# Patient Record
Sex: Female | Born: 1940
Health system: Southern US, Community
[De-identification: ages and names within clinical notes are randomized; demographics above are authoritative.]

## PROBLEM LIST (undated history)

## (undated) DIAGNOSIS — M545 Low back pain, unspecified: Secondary | ICD-10-CM

## (undated) DIAGNOSIS — I10 Essential (primary) hypertension: Secondary | ICD-10-CM

## (undated) DIAGNOSIS — G8929 Other chronic pain: Secondary | ICD-10-CM

## (undated) DIAGNOSIS — Z972 Presence of dental prosthetic device (complete) (partial): Secondary | ICD-10-CM

## (undated) DIAGNOSIS — M549 Dorsalgia, unspecified: Secondary | ICD-10-CM

## (undated) DIAGNOSIS — I340 Nonrheumatic mitral (valve) insufficiency: Secondary | ICD-10-CM

## (undated) DIAGNOSIS — I330 Acute and subacute infective endocarditis: Secondary | ICD-10-CM

## (undated) DIAGNOSIS — R748 Abnormal levels of other serum enzymes: Secondary | ICD-10-CM

## (undated) DIAGNOSIS — R9389 Abnormal findings on diagnostic imaging of other specified body structures: Secondary | ICD-10-CM

## (undated) DIAGNOSIS — E785 Hyperlipidemia, unspecified: Secondary | ICD-10-CM

## (undated) DIAGNOSIS — M1990 Unspecified osteoarthritis, unspecified site: Secondary | ICD-10-CM

## (undated) DIAGNOSIS — Z973 Presence of spectacles and contact lenses: Secondary | ICD-10-CM

## (undated) DIAGNOSIS — I5189 Other ill-defined heart diseases: Secondary | ICD-10-CM

## (undated) HISTORY — DX: Other ill-defined heart diseases: I51.89

## (undated) HISTORY — DX: Acute and subacute infective endocarditis: I33.0

## (undated) HISTORY — DX: Hyperlipidemia, unspecified: E78.5

## (undated) HISTORY — PX: KNEE SURGERY: SHX244

## (undated) HISTORY — DX: Abnormal levels of other serum enzymes: R74.8

## (undated) HISTORY — DX: Essential (primary) hypertension: I10

## (undated) HISTORY — DX: Low back pain, unspecified: M54.50

## (undated) HISTORY — PX: US ECHOCARDIOGRAPHY: HXRAD669

## (undated) HISTORY — PX: COLONOSCOPY: SHX174

## (undated) HISTORY — PX: CARDIAC CATHETERIZATION: SHX172

## (undated) HISTORY — DX: Low back pain: M54.5

## (undated) HISTORY — PX: TONSILLECTOMY: SUR1361

## (undated) HISTORY — DX: Dorsalgia, unspecified: M54.9

---

## 2003-06-19 ENCOUNTER — Encounter
Admission: RE | Admit: 2003-06-19 | Discharge: 2003-09-17 | Payer: Self-pay | Admitting: Physical Medicine & Rehabilitation

## 2003-09-09 ENCOUNTER — Ambulatory Visit (HOSPITAL_COMMUNITY): Admission: RE | Admit: 2003-09-09 | Discharge: 2003-09-09 | Payer: Self-pay | Admitting: Orthopaedic Surgery

## 2003-09-25 ENCOUNTER — Encounter: Admission: RE | Admit: 2003-09-25 | Discharge: 2003-09-25 | Payer: Self-pay | Admitting: Orthopaedic Surgery

## 2003-10-07 ENCOUNTER — Ambulatory Visit (HOSPITAL_COMMUNITY): Admission: RE | Admit: 2003-10-07 | Discharge: 2003-10-07 | Payer: Self-pay | Admitting: Orthopaedic Surgery

## 2003-10-21 ENCOUNTER — Ambulatory Visit (HOSPITAL_COMMUNITY): Admission: RE | Admit: 2003-10-21 | Discharge: 2003-10-21 | Payer: Self-pay | Admitting: Cardiology

## 2003-10-21 HISTORY — PX: CARDIAC CATHETERIZATION: SHX172

## 2003-11-12 ENCOUNTER — Ambulatory Visit (HOSPITAL_COMMUNITY): Admission: RE | Admit: 2003-11-12 | Discharge: 2003-11-13 | Payer: Self-pay | Admitting: Orthopaedic Surgery

## 2003-11-12 HISTORY — PX: LUMBAR DISC SURGERY: SHX700

## 2003-11-25 ENCOUNTER — Ambulatory Visit: Admission: RE | Admit: 2003-11-25 | Discharge: 2003-11-25 | Payer: Self-pay | Admitting: Orthopaedic Surgery

## 2003-11-26 ENCOUNTER — Encounter: Payer: Self-pay | Admitting: Internal Medicine

## 2003-12-17 ENCOUNTER — Encounter: Payer: Self-pay | Admitting: Internal Medicine

## 2004-02-16 ENCOUNTER — Ambulatory Visit (HOSPITAL_COMMUNITY): Admission: RE | Admit: 2004-02-16 | Discharge: 2004-02-16 | Payer: Self-pay | Admitting: Orthopaedic Surgery

## 2004-02-16 HISTORY — PX: KNEE ARTHROSCOPY W/ MENISCAL REPAIR: SHX1877

## 2004-03-02 ENCOUNTER — Ambulatory Visit: Payer: Self-pay | Admitting: Internal Medicine

## 2004-04-11 DIAGNOSIS — I330 Acute and subacute infective endocarditis: Secondary | ICD-10-CM

## 2004-04-11 HISTORY — PX: SPINE SURGERY: SHX786

## 2004-04-11 HISTORY — DX: Acute and subacute infective endocarditis: I33.0

## 2004-04-21 ENCOUNTER — Ambulatory Visit: Payer: Self-pay | Admitting: Cardiology

## 2004-05-03 ENCOUNTER — Ambulatory Visit: Payer: Self-pay

## 2004-05-26 ENCOUNTER — Ambulatory Visit: Payer: Self-pay | Admitting: Cardiovascular Disease

## 2004-06-24 ENCOUNTER — Ambulatory Visit (HOSPITAL_COMMUNITY): Admission: RE | Admit: 2004-06-24 | Discharge: 2004-06-24 | Payer: Self-pay | Admitting: Cardiovascular Disease

## 2004-07-07 ENCOUNTER — Ambulatory Visit: Payer: Self-pay | Admitting: *Deleted

## 2004-10-04 ENCOUNTER — Ambulatory Visit: Payer: Self-pay | Admitting: Internal Medicine

## 2004-11-23 ENCOUNTER — Ambulatory Visit: Payer: Self-pay | Admitting: Internal Medicine

## 2005-01-25 ENCOUNTER — Ambulatory Visit: Payer: Self-pay | Admitting: Internal Medicine

## 2005-02-14 ENCOUNTER — Ambulatory Visit: Payer: Self-pay | Admitting: Gastroenterology

## 2005-02-14 ENCOUNTER — Ambulatory Visit: Payer: Self-pay | Admitting: Internal Medicine

## 2005-02-28 ENCOUNTER — Ambulatory Visit: Payer: Self-pay | Admitting: Gastroenterology

## 2006-09-22 ENCOUNTER — Ambulatory Visit: Payer: Self-pay | Admitting: Internal Medicine

## 2006-09-22 LAB — CONVERTED CEMR LAB
Calcium: 9.7 mg/dL (ref 8.4–10.5)
Chloride: 107 meq/L (ref 96–112)
GFR calc non Af Amer: 90 mL/min

## 2006-11-16 DIAGNOSIS — I10 Essential (primary) hypertension: Secondary | ICD-10-CM | POA: Insufficient documentation

## 2006-12-06 ENCOUNTER — Encounter: Payer: Self-pay | Admitting: Internal Medicine

## 2008-02-28 ENCOUNTER — Ambulatory Visit: Payer: Self-pay | Admitting: Internal Medicine

## 2008-02-28 DIAGNOSIS — I080 Rheumatic disorders of both mitral and aortic valves: Secondary | ICD-10-CM | POA: Insufficient documentation

## 2008-02-28 DIAGNOSIS — E669 Obesity, unspecified: Secondary | ICD-10-CM | POA: Insufficient documentation

## 2008-02-28 DIAGNOSIS — E785 Hyperlipidemia, unspecified: Secondary | ICD-10-CM | POA: Insufficient documentation

## 2008-02-28 DIAGNOSIS — M549 Dorsalgia, unspecified: Secondary | ICD-10-CM | POA: Insufficient documentation

## 2008-03-03 LAB — CONVERTED CEMR LAB
ALT: 16 units/L (ref 0–35)
AST: 21 units/L (ref 0–37)
CO2: 30 meq/L (ref 19–32)
Chloride: 105 meq/L (ref 96–112)
Cholesterol: 190 mg/dL (ref 0–200)
Creatinine, Ser: 0.7 mg/dL (ref 0.4–1.2)
GFR calc Af Amer: 108 mL/min
GFR calc non Af Amer: 89 mL/min
Glucose, Bld: 105 mg/dL — ABNORMAL HIGH (ref 70–99)
HCT: 39.2 % (ref 36.0–46.0)
HDL: 48.4 mg/dL (ref >39.0)
Hemoglobin: 13.3 g/dL (ref 12.0–15.0)
LDL Cholesterol: 128 mg/dL — ABNORMAL HIGH (ref 0–99)
Lymphocytes Relative: 28.8 % (ref 12.0–46.0)
MCHC: 33.8 g/dL (ref 30.0–36.0)
MCV: 85.8 fL (ref 78.0–100.0)
Potassium: 4.1 meq/L (ref 3.5–5.1)
RDW: 13.2 % (ref 11.5–14.6)
Sodium: 143 meq/L (ref 135–145)
Triglycerides: 69 mg/dL (ref 0–149)
VLDL: 14 mg/dL (ref 0–40)
WBC: 6.2 10*3/uL (ref 4.5–10.5)

## 2008-11-04 ENCOUNTER — Ambulatory Visit: Payer: Self-pay | Admitting: Internal Medicine

## 2008-11-04 DIAGNOSIS — R609 Edema, unspecified: Secondary | ICD-10-CM | POA: Insufficient documentation

## 2009-01-02 ENCOUNTER — Ambulatory Visit: Payer: Self-pay | Admitting: Internal Medicine

## 2009-01-06 ENCOUNTER — Encounter: Payer: Self-pay | Admitting: *Deleted

## 2009-01-06 LAB — CONVERTED CEMR LAB
CO2: 29 meq/L (ref 19–32)
Chloride: 108 meq/L (ref 96–112)
GFR calc non Af Amer: 107.54 mL/min (ref >60)
Glucose, Bld: 95 mg/dL (ref 70–99)
Potassium: 5 meq/L (ref 3.5–5.1)

## 2009-06-19 ENCOUNTER — Ambulatory Visit: Payer: Self-pay | Admitting: Internal Medicine

## 2009-12-25 ENCOUNTER — Telehealth: Payer: Self-pay | Admitting: Internal Medicine

## 2009-12-25 ENCOUNTER — Ambulatory Visit: Payer: Self-pay | Admitting: Internal Medicine

## 2009-12-25 LAB — CONVERTED CEMR LAB
AST: 24 units/L (ref 0–37)
BUN: 13 mg/dL (ref 6–23)
Basophils Absolute: 0 10*3/uL (ref 0.0–0.1)
Basophils Relative: 0.2 % (ref 0.0–3.0)
Calcium: 9.7 mg/dL (ref 8.4–10.5)
Chloride: 104 meq/L (ref 96–112)
Cholesterol: 198 mg/dL (ref 0–200)
Hemoglobin: 13.5 g/dL (ref 12.0–15.0)
LDL Cholesterol: 146 mg/dL — ABNORMAL HIGH (ref 0–99)
Lymphs Abs: 1.8 10*3/uL (ref 0.7–4.0)
MCHC: 32.8 g/dL (ref 30.0–36.0)
Monocytes Absolute: 0.6 10*3/uL (ref 0.1–1.0)
Monocytes Relative: 6.6 % (ref 3.0–12.0)
Neutro Abs: 6.2 10*3/uL (ref 1.4–7.7)
Potassium: 5.1 meq/L (ref 3.5–5.1)
RDW: 14.2 % (ref 11.5–14.6)
Total Bilirubin: 0.5 mg/dL (ref 0.3–1.2)
Total Protein: 7.1 g/dL (ref 6.0–8.3)
WBC: 8.7 10*3/uL (ref 4.5–10.5)

## 2010-01-04 ENCOUNTER — Ambulatory Visit: Payer: Self-pay | Admitting: Internal Medicine

## 2010-01-26 ENCOUNTER — Telehealth: Payer: Self-pay | Admitting: *Deleted

## 2010-02-10 ENCOUNTER — Telehealth: Payer: Self-pay | Admitting: Internal Medicine

## 2010-02-10 ENCOUNTER — Ambulatory Visit: Payer: Self-pay | Admitting: Family Medicine

## 2010-02-10 DIAGNOSIS — J209 Acute bronchitis, unspecified: Secondary | ICD-10-CM | POA: Insufficient documentation

## 2010-02-10 DIAGNOSIS — R042 Hemoptysis: Secondary | ICD-10-CM | POA: Insufficient documentation

## 2010-02-23 ENCOUNTER — Encounter: Admission: RE | Admit: 2010-02-23 | Discharge: 2010-02-23 | Payer: Self-pay | Admitting: Internal Medicine

## 2010-02-23 ENCOUNTER — Ambulatory Visit: Payer: Self-pay | Admitting: Internal Medicine

## 2010-02-23 DIAGNOSIS — R059 Cough, unspecified: Secondary | ICD-10-CM | POA: Insufficient documentation

## 2010-02-23 DIAGNOSIS — R05 Cough: Secondary | ICD-10-CM

## 2010-02-23 IMAGING — MG MM DIGITAL SCREENING
6 series · 6 of 6 positions shown · non-contrast
Comparison: none

DG SCREEN MAMMOGRAM BILATERAL
Bilateral CC and MLO view(s) were taken.

DIGITAL SCREENING MAMMOGRAM WITH CAD:
The breast tissue is almost entirely fatty.  A possible mass is noted in the right breast.  Spot 
compression views and possibly sonography are recommended for further evaluation.  The left breast 
is unremarkable.
Images were processed with CAD.

[R CC (1 of 2)]
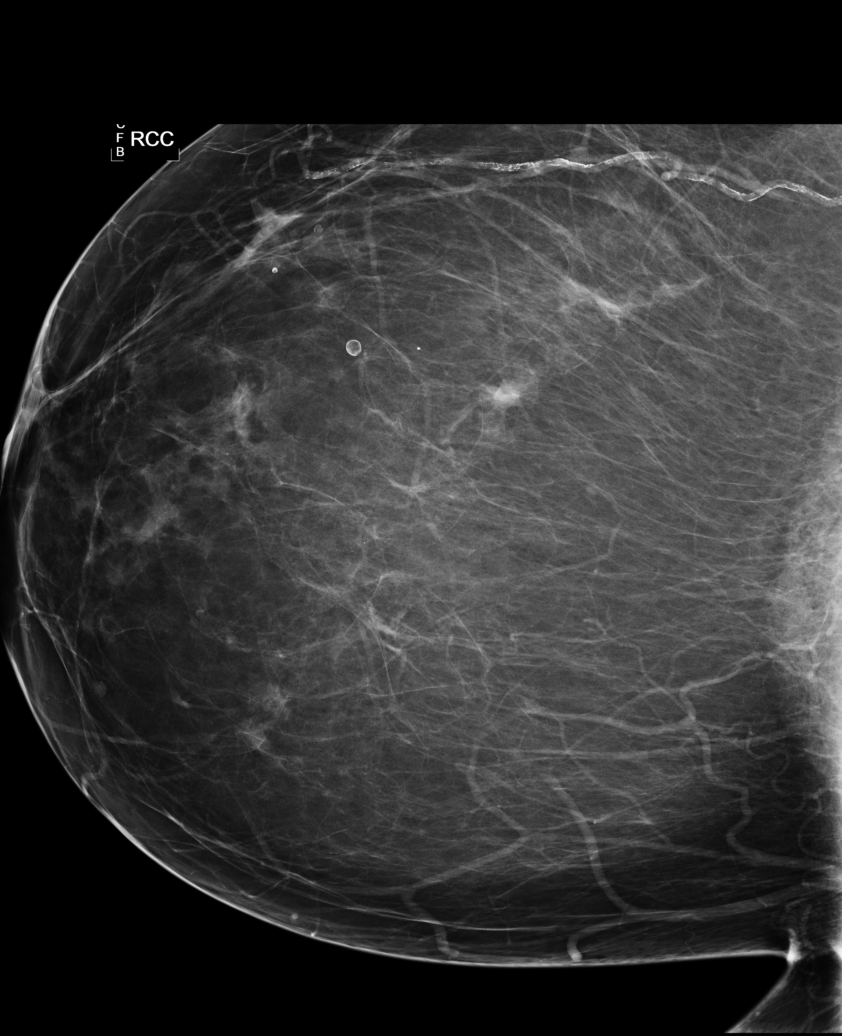

[L CC]
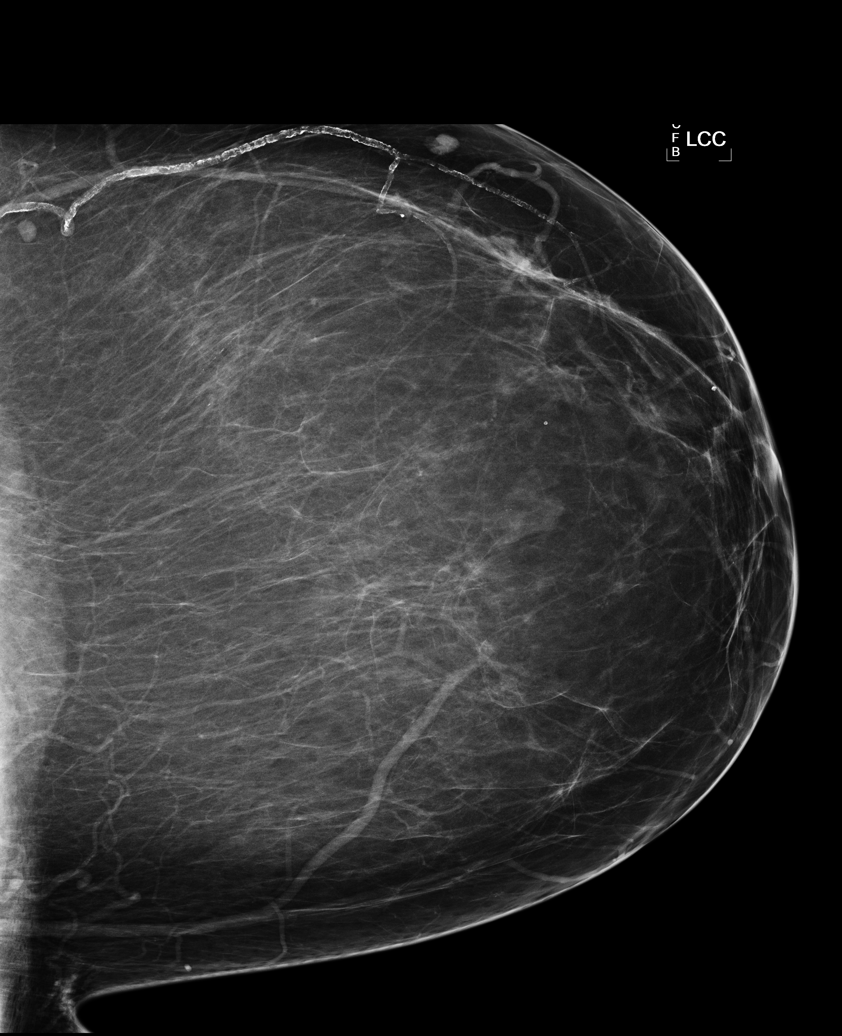

[L MLO]
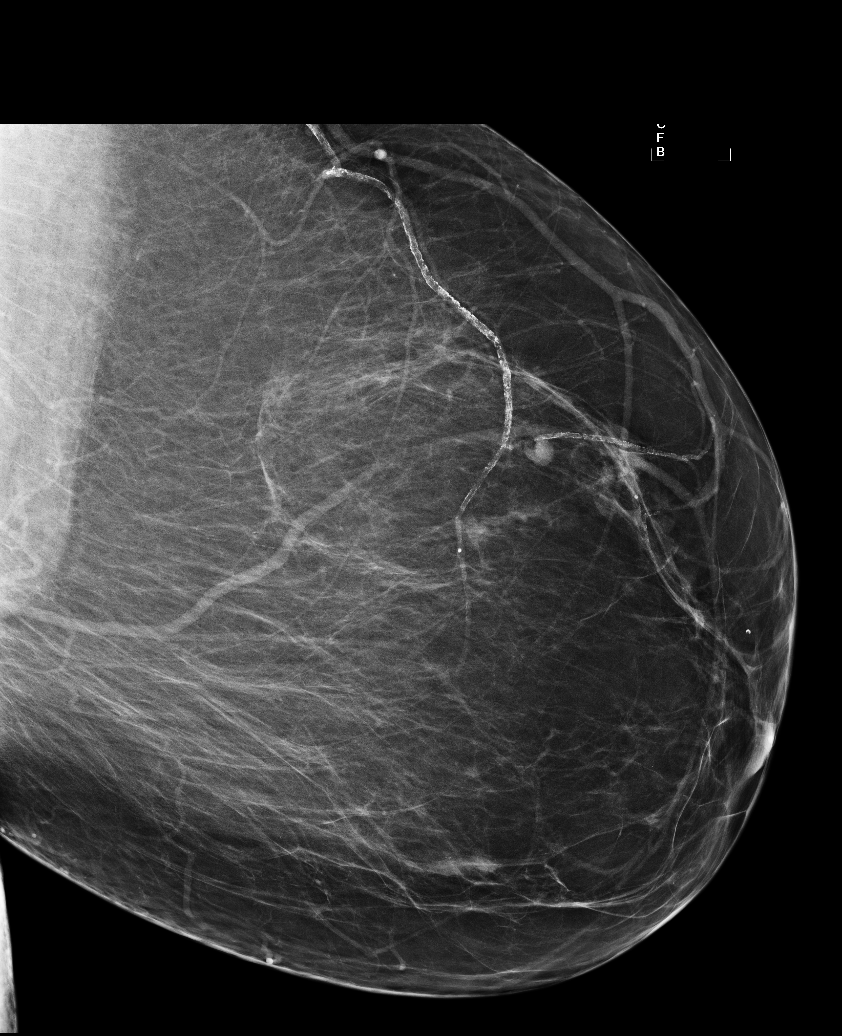

[R MLO (1 of 2)]
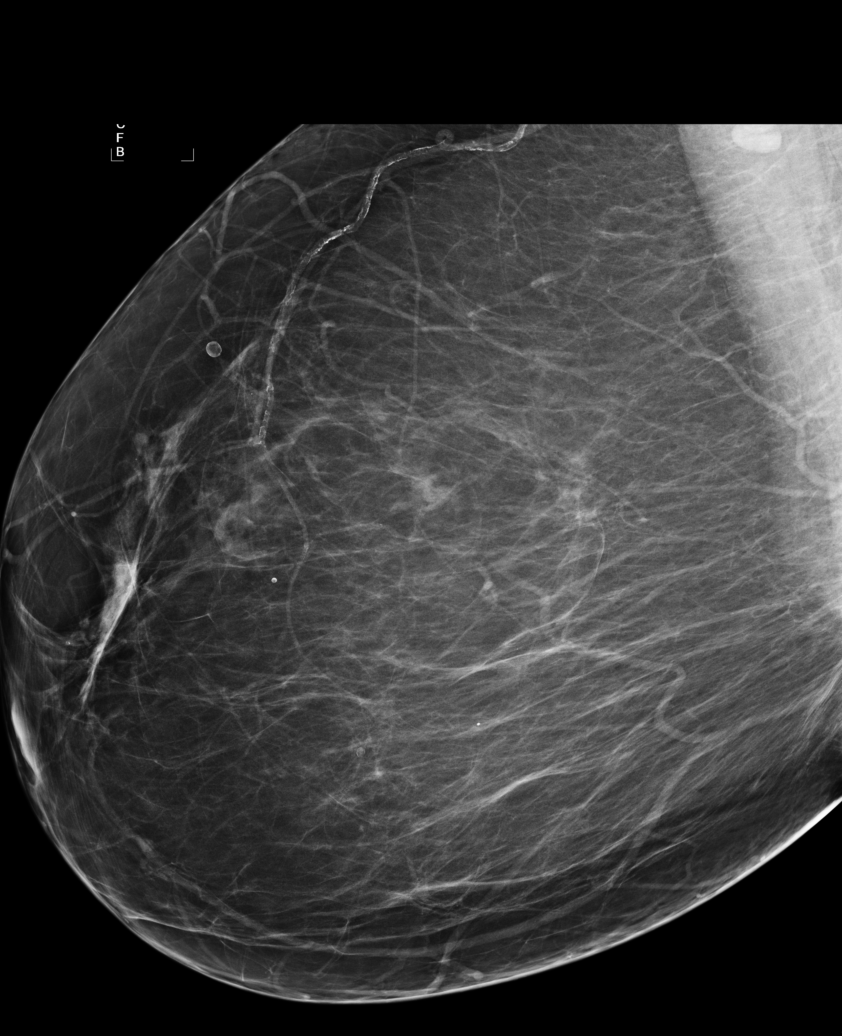

[R CC (2 of 2)]
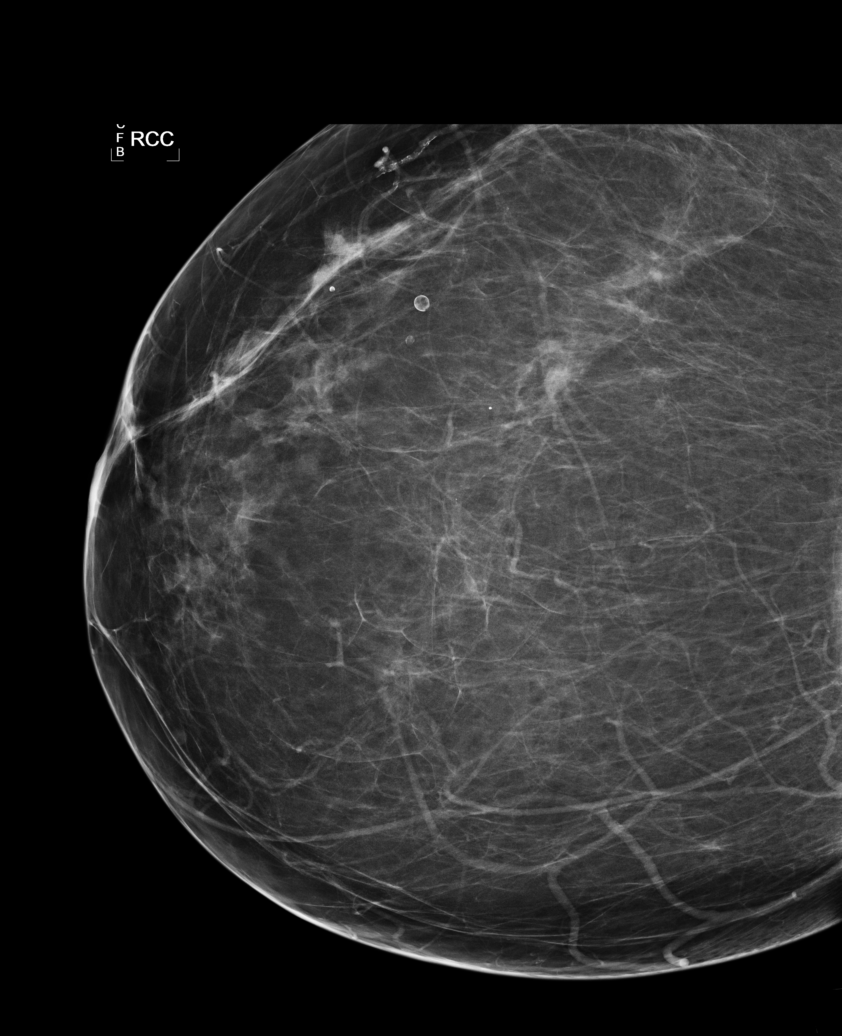

[R MLO (2 of 2)]
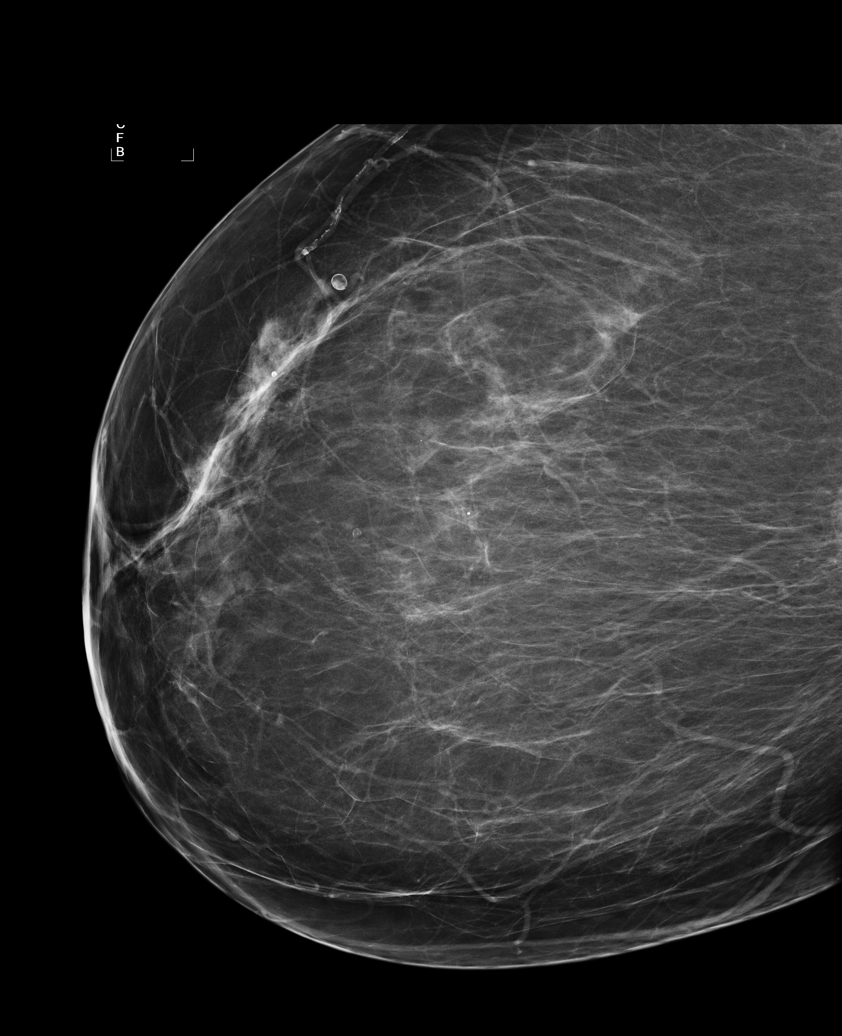

[6 of 6 positions shown; findings below may reference images not displayed]

IMPRESSION: Possible mass, right breast.  Additional evaluation is indicated.  The patient will be contacted 
for additional studies and a supplemental report will follow.

ASSESSMENT: Need additional imaging evaluation and/or prior mammograms for comparison - BI-RADS 0 -
Right

Further imaging of the right breast.
,

## 2010-03-02 ENCOUNTER — Telehealth: Payer: Self-pay | Admitting: *Deleted

## 2010-03-08 ENCOUNTER — Ambulatory Visit: Payer: Self-pay | Admitting: Internal Medicine

## 2010-03-08 ENCOUNTER — Other Ambulatory Visit
Admission: RE | Admit: 2010-03-08 | Discharge: 2010-03-08 | Payer: Self-pay | Source: Home / Self Care | Admitting: Internal Medicine

## 2010-03-08 DIAGNOSIS — R5383 Other fatigue: Secondary | ICD-10-CM

## 2010-03-08 DIAGNOSIS — R5381 Other malaise: Secondary | ICD-10-CM | POA: Insufficient documentation

## 2010-03-08 LAB — HM PAP SMEAR

## 2010-03-09 ENCOUNTER — Encounter: Payer: Self-pay | Admitting: Internal Medicine

## 2010-03-11 ENCOUNTER — Encounter: Payer: Self-pay | Admitting: Internal Medicine

## 2010-03-12 ENCOUNTER — Encounter: Admission: RE | Admit: 2010-03-12 | Discharge: 2010-03-12 | Payer: Self-pay | Admitting: Internal Medicine

## 2010-03-12 LAB — HM MAMMOGRAPHY

## 2010-03-15 ENCOUNTER — Encounter: Payer: Self-pay | Admitting: *Deleted

## 2010-03-15 LAB — CONVERTED CEMR LAB
Eosinophils Absolute: 0.1 10*3/uL (ref 0.0–0.7)
Folate: 4.5 ng/mL
HCT: 38.6 % (ref 36.0–46.0)
Lymphocytes Relative: 25.6 % (ref 12.0–46.0)
Lymphs Abs: 2.3 10*3/uL (ref 0.7–4.0)
Monocytes Absolute: 0.8 10*3/uL (ref 0.1–1.0)
Monocytes Relative: 8.7 % (ref 3.0–12.0)
Neutro Abs: 5.7 10*3/uL (ref 1.4–7.7)
Neutrophils Relative %: 64.1 % (ref 43.0–77.0)
Pap Smear: NEGATIVE
Platelets: 235 10*3/uL (ref 150.0–400.0)
RBC: 4.54 M/uL (ref 3.87–5.11)
WBC: 8.9 10*3/uL (ref 4.5–10.5)

## 2010-03-16 ENCOUNTER — Ambulatory Visit: Payer: Self-pay

## 2010-03-16 ENCOUNTER — Ambulatory Visit (HOSPITAL_COMMUNITY)
Admission: RE | Admit: 2010-03-16 | Discharge: 2010-03-16 | Payer: Self-pay | Source: Home / Self Care | Admitting: Internal Medicine

## 2010-03-16 ENCOUNTER — Encounter: Payer: Self-pay | Admitting: Internal Medicine

## 2010-03-19 ENCOUNTER — Telehealth: Payer: Self-pay | Admitting: *Deleted

## 2010-05-11 NOTE — Progress Notes (Signed)
Summary: refill  Phone Note Refill Request Message from:  Patient--live call  Refills Requested: Medication #1:  NORVASC 10 MG TABS 1 by mouth once daily send to [facility]  Initial call taken by: Warnell Forester,  December 25, 2009 11:10 AM  Follow-up for Phone Call        ok to refill x 2  Follow-up by: Madelin Headings MD,  December 25, 2009 1:24 PM  Additional Follow-up for Phone Call Additional follow up Details #1::        Rx faxed to pharmacy    Prescriptions: NORVASC 10 MG TABS (AMLODIPINE BESYLATE) 1 by mouth once daily  #60 x 2   Entered by:   Mervin Hack CMA (AAMA)   Authorized by:   Madelin Headings MD   Signed by:   Mervin Hack CMA (AAMA) on 12/25/2009   Method used:   Electronically to        [id number] (retail)       [address] [place], [place]  [address]       Ph: [phone]       Fax: [phone]   RxID:   [id number]

## 2010-05-11 NOTE — Assessment & Plan Note (Signed)
Summary: 2 month follow up/pap/cb   Vital Signs:  Patient profile:   70 year old female Menstrual status:  postmenopausal Height:      65.5 inches Weight:      259 pounds Pulse rate:   66 / minute BP sitting:   120 / 80  (right arm) Cuff size:   large  Vitals Entered By: Romualdo Bolk, CMA (AAMA) (March 08, 2010 10:31 AM) CC: Annual Visit for Disease Management with pap   History of Present Illness: Nina Le comes in today  for wellness visit and pap. She is here with her daughter . also who lives with her .   Since her last visit her cough has drmatically improved but she is still very t red and "has no energy" NO real cp or sob. NO pain except for her bac predicament.  Here for Medicare AWV:  1.   Risk factors based on Past M, S, F history: 2.   Physical Activities:  no exercises  3.   Depression/mood:  no curently but daughter was concerned from illness.  4.   Hearing:   good  5.   ADL's:   all of these.  6.   Fall Risk:   no falls 7.   Home Safety:  reviewed  8.   Height, weight, &visual acuity:   stable  9.   Counseling:  10.   Labs ordered based on risk factors:  11.           Referral Coordination 12.           Care Plan 13.            Cognitive Assessment   Pt is A&Ox3,affect,speech,memory,attention,&motor skills appear intact.    Physical energy is down.     sleep  is not good.      some snoring.   up and down at night.  Doesnt kn0w why but has TV on to fall asleep.   Preventive Care Screening  Colonoscopy:    Date:  02/28/2005    Results:  normal   Last Tetanus Booster:    Date:  04/11/2004    Results:  Historical   Prior Values:    Mammogram:  mammograms for comparison - BI-RADS 0 -^MM DIGITAL SCREENING (02/23/2010)    Last Flu Shot:  Fluvax 3+ (01/04/2010)   Preventive Screening-Counseling & Management  Alcohol-Tobacco     Alcohol drinks/day: 0     Smoking Status: never  Caffeine-Diet-Exercise     Caffeine use/day: <1     Does  Patient Exercise: yes     Type of exercise: walking  Hep-HIV-STD-Contraception     Dental Visit-last 6 months no  Safety-Violence-Falls     Seat Belt Use: yes     Firearms in the Home: firearms in the home     Firearm Counseling: not indicated; uses recommended firearm safety measures     Smoke Detectors: yes     Fall Risk: no  Current Medications (verified): 1)  Norvasc 10 Mg Tabs (Amlodipine Besylate) .Marland Kitchen.. 1 By Mouth Once Daily 2)  Multivitamins   Tabs (Multiple Vitamin) 3)  Lisinopril-Hydrochlorothiazide 20-12.5 Mg Tabs (Lisinopril-Hydrochlorothiazide) .Marland Kitchen.. 1 By Mouth Once Daily  For High Blood Pressure 4)  Hydrocodone-Acetaminophen 5-325 Mg Tabs (Hydrocodone-Acetaminophen) .Marland Kitchen.. 1 By Mouth Q 4-6 Hours As Needed Pain. 5)  Mucinex 600 Mg Xr12h-Tab (Guaifenesin) .Marland Kitchen.. 1 Tab By Mouth Two Times A Day X 10 Day  Allergies (verified): No Known Drug Allergies  Past History:  Past medical, surgical, family and social histories (including risk factors) reviewed, and no changes noted (except as noted below).  Past Medical History: Hypertension Low Back Pain Hyperlipidemia Echo Mild MR diastolic dysfunction cath 2004  Hx inc Alk Phos  neg lfts         LAST Td: 2002 Other: Pneumovax 2003  Consults: Dr. Vanetta Mulders  Past Surgical History: Reviewed history from 01/04/2010 and no changes required. Echo Mild Mitral Regurgitation Spine Surgery   2006 Dr Ophelia Charter  Rt Knee Surgery Normal Coronary arteries by catheterization ? 2006   Past History:  Care Management: [facility]opedics: [facility] Gastro[facility]ology: Jarold Motto  Family History: Reviewed history from 01/04/2010 and no changes required. Family History of Arthritis Family History Hypertension Family History Other cancer-Breast Sister  Breast cancer     Social History: Reviewed history from 01/02/2009 and no changes required. Never Smoked Alcohol use-no Drug use-no Regular exercise-yes  residence [facility] but in  town frequently hh of 2  no pets.  sleep 4-5 hours.  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   Review of Systems  The patient denies anorexia, fever, weight loss, decreased hearing, hoarseness, chest pain, syncope, dyspnea on exertion, headaches, abdominal pain, melena, hematochezia, severe indigestion/heartburn, hematuria, muscle weakness, transient blindness, depression, unusual weight change, abnormal bleeding, enlarged lymph nodes, angioedema, and breast masses.         left knee bothers her, some snoring rest of ros as per hpi  Physical Exam  General:  Well-developed,well-nourished,in no acute distress; alert,appropriate and cooperative throughout examination Head:  Normocephalic and atraumatic without obvious abnormalities. No apparent alopecia or balding. Eyes:  PERRL, EOMs full, conjunctiva clear  Ears:  R ear normal, L ear normal, and no external deformities.   Nose:  no external deformity, no external erythema, and no nasal discharge.   Mouth:  low lying palate no lesions Neck:  No deformities, masses, or tenderness noted. Breasts:  No mass, nodules, thickening, tenderness, bulging, retraction, inflamation, nipple discharge or skin changes noted.   Lungs:  Normal respiratory effort, chest expands symmetrically. Lungs are clear to auscultation, no crackles or wheezes. Heart:  Normal rate and regular rhythm. S1 and S2 normal without gallop, murmur, click, rub or other extra sounds.no lifts.   Abdomen:  Bowel sounds positive,abdomen soft and non-tender without masses, organomegaly or hernias noted. Rectal:  No external abnormalities noted. Normal sphincter tone. No rectal masses or tenderness. Genitalia:  Pelvic Exam:        External: normal female genitalia without lesions or masses        Vagina: normal without lesions or masses        Cervix: normal without lesions or masses        Adnexa: normal bimanual exam without masses or fullness        Uterus: normal by  palpation        Pap smear: performed Msk:  no joint warmth and no redness over joints.  well healed  back scar no effusion  or warmth  Pulses:  pulses intact without delay  no bruits felt Extremities:  no clubbing cyanosis or edema    Neurologic:  antalgic gait alert & oriented X3 and strength normal in all extremities.  grossly non focal   Pt is A&Ox3,affect,speech,memory,attention,&motor skills appear intact.  Skin:  turgor normal, color normal, no ecchymoses, no petechiae, and no purpura.   Cervical Nodes:  No lymphadenopathy noted Axillary Nodes:  No palpable lymphadenopathy Inguinal Nodes:  No significant  adenopathy Psych:  Normal eye contact, appropriate affect. Cognition appears normal.  EKG  q in inferior leads none to compare  previously   Impression & Recommendations:  Problem # 1:  Preventive Health Care (ICD-V70.0)  disc healthy eating activity  and  fall prevention and sleep  Orders: Medicare -1st Annual Wellness Visit [phone])  Problem # 2:  TIREDNESS (ICD-780.79) seems to be worse after this illness and not back to baseline although daughter says  may predate.       medications could have been involved .  will follow  Orders: TLB-B12 + Folate Pnl (42595_63875-I43/PIR) TLB-TSH (Thyroid Stimulating Hormone) (84443-TSH) TLB-Sedimentation Rate (ESR) (85652-ESR) TLB-T4 (Thyrox), Free [id number]) T-Vitamin D (25-Hydroxy) [phone]) T- * Misc. Laboratory test [phone]) Venipuncture [phone]) Specimen Handling (54270) Cardiology Referral (Cardiology) EKG w/ Interpretation (93000)  Problem # 3:  COUGH (ICD-786.2) Assessment: Improved sig   improvment   Problem # 4:  BACK PAIN (ICD-724.5) Assessment: Unchanged chronic  sp back surgery Her updated medication list for this problem includes:    Hydrocodone-acetaminophen 5-325 Mg Tabs (Hydrocodone-acetaminophen) .Marland Kitchen... 1 by mouth q 4-6 hours as needed pain.  Problem # 5:  HYPERTENSION (ICD-401.9)  Her updated  medication list for this problem includes:    Norvasc 10 Mg Tabs (Amlodipine besylate) .Marland Kitchen... 1 by mouth once daily    Lisinopril-hydrochlorothiazide 20-12.5 Mg Tabs (Lisinopril-hydrochlorothiazide) .Marland Kitchen... 1 by mouth once daily  for high blood pressure  Orders: Cardiology Referral (Cardiology) EKG w/ Interpretation (93000)  BP today: 120/80 Prior BP: 110/76 (02/23/2010)  Prior 10 Yr Risk Heart Disease: 13 % (01/04/2010)  Labs Reviewed: K+: 5.1 (12/25/2009) Creat: : 0.7 (12/25/2009)   Chol: 198 (12/25/2009)   HDL: 43.50 (12/25/2009)   LDL: 146 (12/25/2009)   TG: 45.0 (12/25/2009)  Problem # 6:  OBESITY (ICD-278.00) rec weight loss Ht: 65.5 (03/08/2010)   Wt: 259 (03/08/2010)   BMI: 42.43 (01/04/2010)  Problem # 7:  MITRAL REGURGITATION, MILD (ICD-396.3) in past    no sig murmur on exam today .    due for a repeat echo by chard review Orders: Cardiology Referral (Cardiology) EKG w/ Interpretation (93000)  Complete Medication List: 1)  Norvasc 10 Mg Tabs (Amlodipine besylate) .Marland Kitchen.. 1 by mouth once daily 2)  Multivitamins Tabs (Multiple vitamin) 3)  Lisinopril-hydrochlorothiazide 20-12.5 Mg Tabs (Lisinopril-hydrochlorothiazide) .Marland Kitchen.. 1 by mouth once daily  for high blood pressure 4)  Hydrocodone-acetaminophen 5-325 Mg Tabs (Hydrocodone-acetaminophen) .Marland Kitchen.. 1 by mouth q 4-6 hours as needed pain. 5)  Mucinex 600 Mg Xr12h-tab (Guaifenesin) .Marland Kitchen.. 1 tab by mouth two times a day x 10 day  Other Orders: Obtaining Screening PAP Smear (W2376) Pelvic & Breast Exam ( Medicare)  (E8315)  Patient Instructions: 1)  you could be tired for a lot of reasons. 2)  You will be informed of lab results when available.  3)  Try to get regular sleep. as discussed . 4)  COnsider seein sleep  specialist  conisder sleep apnea as a contributor. 5)  Losing weight will help energy and your back and joints.  6)  your  heart seems ok but would recheck echo test   to make sure no problems contributing to your  fatigue.  will plan ,follow up after above done.   Orders Added: 1)  TLB-B12 + Folate Pnl [[id number]-B12/FOL] 2)  TLB-TSH (Thyroid Stimulating Hormone) [84443-TSH] 3)  TLB-Sedimentation Rate (ESR) [85652-ESR] 4)  TLB-T4 (Thyrox), Free [17616-WV3X] 5)  T-Vitamin D (25-Hydroxy) [10626-94854] 6)  T- * Misc. Laboratory test [phone] 7)  Venipuncture [19147] 8)  Specimen Handling [99000] 9)  Cardiology Referral [Cardiology] 10)  Medicare -1st Annual Wellness Visit [G0438] 11)  Obtaining Screening PAP Smear [Q0091] 12)  Pelvic & Breast Exam ( Medicare)  [G0101] 13)  Est. Patient Level III [82956] 14)  EKG w/ Interpretation [93000]

## 2010-05-11 NOTE — Letter (Signed)
Summary: Generic Letter  [place] [facility]  [address] [place], [place] [address]   Phone: [phone]  Fax: [phone]    03/15/2010  Nina Le [address] [place], [place]  [address]  Dear Ms. Nedra Hai,   (1) B12 + Folate Panel (B12/FOL)   Vitamin B12               273 pg/mL                   211-911   Folate                    4.5 ng/mL     Deficient  0.4 - 3.4 ng/mL     Indeterminate  3.4 - 5.4 ng/mL     Normal  >5.4 ng/mL  Tests: (2) TSH (TSH)   FastTSH                   0.67 uIU/mL                 0.35-5.50  Tests: (3) Sed Rate (ESR)   Sed Rate             [H]  33 mm/hr                    0-22  Tests: (4) T4, Free (FT4R)   Free T4                   0.93 ng/dL                  0.60-1.60  Tests: (5) CBC Platelet w/Diff (CBCD)   White Cell Count          8.9 K/uL                    4.5-10.5   Red Cell Count            4.54 Mil/uL                 3.87-5.11   Hemoglobin                12.8 g/dL                   01.7-79.3   Hematocrit                38.6 %                      36.0-46.0   MCV                       84.9 fl                     78.0-100.0   MCHC                      33.2 g/dL                   90.3-00.9   RDW                       14.3 %                      11.5-14.6   Platelet Count            235.0 K/uL  150.0-400.0   Neutrophil %              64.1 %                      43.0-77.0   Lymphocyte %              25.6 %                      12.0-46.0   Monocyte %                8.7 %                       3.0-12.0   Eosinophils%              1.3 %                       0.0-5.0   Basophils %               0.3 %                       0.0-3.0   Neutrophill Absolute      5.7 K/uL                    1.4-7.7   Lymphocyte Absolute       2.3 K/uL                    0.7-4.0   Monocyte Absolute         0.8 K/uL                    0.1-1.0  Eosinophils, Absolute                             0.1 K/uL                    0.0-0.7   Basophils  Absolute        0.0 K/uL                    0.0-0.1    Your lab results are normal except you have a slightly low vitamin d level. If you have any questions, please give [place] a call at [phone].    Sincerely,  Tor Netters, CMA (AAMA)

## 2010-05-11 NOTE — Progress Notes (Signed)
  Phone Note Call from Patient   Caller: Patient Call For: Madelin Headings MD Summary of Call: Coughing up blood this morning with mucus.  URI x 10 days with nasal congestion and cough.  Taking Delsym.  Temp???  Daughter is insisting on appt today with Dr. Fabian Sharp.  Please call ASAP. [phone] Initial call taken by: [facility] CMA AAMA,  February 10, 2010 12:37 PM  Follow-up for Phone Call        Pt continues to call.  Left messages on your voice mail, Carollee Herter. Follow-up by: Lynann Beaver CMA AAMA,  February 10, 2010 12:55 PM  Additional Follow-up for Phone Call Additional follow up Details #1::        Okay to put on Dr. Mariel Aloe schedule. Per Dr. Fabian Sharp do a CXR before she comes in. Pt aware and order placed in emr. Additional Follow-up by: Romualdo Bolk, CMA (AAMA),  February 10, 2010 1:25 PM  New Problems: HEMOPTYSIS UNSPECIFIED (ICD-786.30)   New Problems: HEMOPTYSIS UNSPECIFIED (ICD-786.30)

## 2010-05-11 NOTE — Assessment & Plan Note (Signed)
Summary: follow up/CB   Vital Signs:  Patient profile:   70 year old female Menstrual status:  postmenopausal Height:      65.5 inches Weight:      258 pounds BMI:     42.43 Pulse rate:   72 / minute BP sitting:   120 / 80  (left arm) Cuff size:   large  Vitals Entered By: Romualdo Bolk, CMA (AAMA) (January 04, 2010 11:03 AM)  Nutrition Counseling: Patient's BMI is greater than 25 and therefore counseled on weight management options. CC: Follow-up visit on labs, Hypertension Management   History of Present Illness: Nina Le comes in today   for follow up of a number of issues  Since last visit  here  there have been no major changes in health status  . Back : stable and taking pain  med  2-3 per week.    HT: No problems with meds   feet swelling  when up a lots. down when  elevated.  NO se of meds . PHC :  no recent mammo  or pap. NO symptoms .   utd  on other immuniz  except zostavax.   Hypertension History:      She complains of peripheral edema, but denies headache, chest pain, palpitations, dyspnea with exertion, orthopnea, PND, visual symptoms, neurologic problems, syncope, and side effects from treatment.  She notes no problems with any antihypertensive medication side effects.        Positive major cardiovascular risk factors include female age 25 years old or older, hyperlipidemia, and hypertension.  Negative major cardiovascular risk factors include non-tobacco-user status.     Preventive Screening-Counseling & Management  Alcohol-Tobacco     Alcohol drinks/day: 0     Smoking Status: never  Caffeine-Diet-Exercise     Caffeine use/day: <1     Does Patient Exercise: yes     Type of exercise: walking  Hep-HIV-STD-Contraception     Dental Visit-last 6 months no  Safety-Violence-Falls     Seat Belt Use: yes     Firearms in the Home: firearms in the home     Firearm Counseling: not indicated; uses recommended firearm safety measures     Smoke Detectors:  yes     Fall Risk: no  Current Medications (verified): 1)  Norvasc 10 Mg Tabs (Amlodipine Besylate) .Marland Kitchen.. 1 By Mouth Once Daily 2)  Multivitamins   Tabs (Multiple Vitamin) 3)  Lisinopril-Hydrochlorothiazide 20-12.5 Mg Tabs (Lisinopril-Hydrochlorothiazide) .Marland Kitchen.. 1 By Mouth Once Daily  For High Blood Pressure 4)  Hydrocodone-Acetaminophen 5-325 Mg Tabs (Hydrocodone-Acetaminophen) .Marland Kitchen.. 1 By Mouth Q 4-6 Hours As Needed Pain.  Allergies (verified): No Known Drug Allergies  Past History:  Past medical, surgical, family and social histories (including risk factors) reviewed, and no changes noted (except as noted below).  Past Medical History: Reviewed history from 02/28/2008 and no changes required. Hypertension Low Back Pain Hyperlipidemia Echo Mild MR diastolic dysfunction Hx inc Alk Phos  neg lfts         LAST Td: 2002 Other: Pneumovax 2003  Consults: Dr. Vanetta Mulders  Past Surgical History: Echo Mild Mitral Regurgitation Spine Surgery   2006 Dr Ophelia Charter  Rt Knee Surgery Normal Coronary arteries by catheterization ? 2006   Past History:  Care Management: [facility]: [facility]  Family History: Reviewed history from 11/16/2006 and no changes required. Family History of Arthritis Family History Hypertension Family History Other cancer-Breast Sister  Breast cancer     Social History: Reviewed history from 01/02/2009  and no changes required. Never Smoked Alcohol use-no Drug use-no Regular exercise-yes  residence [facility] but in town frequently hh of 2  no pets.  sleep 4-5 hours.  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  Fall Risk:  no Dental Care w/in 6 mos.:  no Seat Belt Use:  yes  Review of Systems  The patient denies anorexia, fever, chest pain, syncope, dyspnea on exertion, prolonged cough, hemoptysis, abdominal pain, transient blindness, and depression.    Physical Exam  General:  Well-developed,well-nourished,in no acute distress;  alert,appropriate and cooperative throughout examination Head:  normocephalic and atraumatic.   Eyes:  vision grossly intact.   Neck:  No deformities, masses, or tenderness noted. Lungs:  Normal respiratory effort, chest expands symmetrically. Lungs are clear to auscultation, no crackles or wheezes. Heart:  normal rate, regular rhythm, no gallop, no rub, no JVD, and no lifts.    i dont hear murmur  Abdomen:  Bowel sounds positive,abdomen soft and non-tender without masses, organomegaly or   noted. Msk:  no joint swelling and no joint warmth.   antalgic gait  Pulses:  pulses intact without delay   Extremities:  1+ left pedal edema and 1+ right pedal edema.   Neurologic:  alert & oriented X3.   Skin:  turgor normal and color normal.   Cervical Nodes:  No lymphadenopathy noted Psych:  Oriented X3, good eye contact, not anxious appearing, and not depressed appearing.     Impression & Recommendations:  Problem # 1:  HYPERTENSION (ICD-401.9)  Her updated medication list for this problem includes:    Norvasc 10 Mg Tabs (Amlodipine besylate) .Marland Kitchen... 1 by mouth once daily    Lisinopril-hydrochlorothiazide 20-12.5 Mg Tabs (Lisinopril-hydrochlorothiazide) .Marland Kitchen... 1 by mouth once daily  for high blood pressure  BP today: 120/80 Prior BP: 110/70 (06/19/2009)  10 Yr Risk Heart Disease: 13 % Prior 10 Yr Risk Heart Disease: 7 % (06/19/2009)  Labs Reviewed: K+: 5.1 (12/25/2009) Creat: : 0.7 (12/25/2009)   Chol: 198 (12/25/2009)   HDL: 43.50 (12/25/2009)   LDL: 146 (12/25/2009)   TG: 45.0 (12/25/2009)  Problem # 2:  HYPERLIPIDEMIA (ICD-272.4)  Labs Reviewed: SGOT: 24 (12/25/2009)   SGPT: 19 (12/25/2009)  10 Yr Risk Heart Disease: 13 % Prior 10 Yr Risk Heart Disease: 7 % (06/19/2009)   HDL:43.50 (12/25/2009), 48.4 (02/28/2008)  LDL:146 (12/25/2009), 128 (02/28/2008)  Chol:198 (12/25/2009), 190 (02/28/2008)  Trig:45.0 (12/25/2009), 69 (02/28/2008)  Problem # 3:  BACK PAIN (ICD-724.5) Assessment:  Unchanged  Her updated medication list for this problem includes:    Hydrocodone-acetaminophen 5-325 Mg Tabs (Hydrocodone-acetaminophen) .Marland Kitchen... 1 by mouth q 4-6 hours as needed pain.  Problem # 4:  OBESITY (ICD-278.00) Assessment: Unchanged weight loss would help her medical problems   Problem # 5:  Preventive Health Care (ICD-V70.0) review of record and due for mammo and pap  and dexa   can schedule this  .   Problem # 6:  MITRAL REGURGITATION, MILD (ICD-396.3) dont hear murmur today and no new symptom will need to review record about appropriate follow up .     Complete Medication List: 1)  Norvasc 10 Mg Tabs (Amlodipine besylate) .Marland Kitchen.. 1 by mouth once daily 2)  Multivitamins Tabs (Multiple vitamin) 3)  Lisinopril-hydrochlorothiazide 20-12.5 Mg Tabs (Lisinopril-hydrochlorothiazide) .Marland Kitchen.. 1 by mouth once daily  for high blood pressure 4)  Hydrocodone-acetaminophen 5-325 Mg Tabs (Hydrocodone-acetaminophen) .Marland Kitchen.. 1 by mouth q 4-6 hours as needed pain.  Other Orders: Flu Vaccine 21yrs + MEDICARE PATIENTS (X9147)  Administration Flu vaccine - MCR [phone])  Hypertension Assessment/Plan:      The patient's hypertensive risk group is category B: At least one risk factor (excluding diabetes) with no target organ damage.  Her calculated 10 year risk of coronary heart disease is 13 %.  Today's blood pressure is 120/80.  Her blood pressure goal is < 140/90.  Patient Instructions: 1)  schedule  routine . mammography    [facility]  . 2)  Schedule DEXA scan.(  Dx menopausal) 3)   [facility]  in 2 months and we will do pap and pelvic at that time.  put 2 slots together not on a thursday or friday PM) Flu Vaccine Consent Questions     Do you have a history of severe allergic reactions to this vaccine? no    Any prior history of allergic reactions to egg and/or gelatin? no    Do you have a sensitivity to the preservative Thimersol? no    Do you have a past history of  Guillan-Barre Syndrome? no    Do you currently have an acute febrile illness? no    Have you ever had a severe reaction to latex? no    Vaccine information given and explained to patient? yes    Are you currently pregnant? no    Lot Number:[id number]   Exp Date:10/09/2010   Site Given  Left Deltoid IMflu Romualdo Bolk, CMA (AAMA)  January 04, 2010 11:06 AM review of record after patient left .  No  documentaiton of copd and ephysema and unclear how this dx was made and put in EHR .    will pull paper record regarding this .  mayalso need  repeat echo  for her mild MR and aortic sclerosis  as i blieve she has not had follow up of this  .   wkpanosh

## 2010-05-11 NOTE — Progress Notes (Signed)
  Phone Note Call from Patient Call back at Home Phone [phone]   Caller: Patient Call For: Madelin Headings MD Summary of Call: Letter sent to pt advising her that she had a slightly low Vitamin D, but wants to know if she needs a supplement? Initial call taken by: [facility] CMA AAMA,  March 19, 2010 4:02 PM  Follow-up for Phone Call        I told pt to take 1000 international units of vit d a day. Pt aware of this. Follow-up by: Romualdo Bolk, CMA (AAMA),  March 19, 2010 4:05 PM

## 2010-05-11 NOTE — Assessment & Plan Note (Signed)
Summary: ongoing cough/ssc   Vital Signs:  Patient profile:   70 year old female Menstrual status:  postmenopausal Weight:      250 pounds O2 Sat:      96 % on Room air Temp:     98.7 degrees F oral Pulse rate:   104 / minute BP sitting:   110 / 76  (right arm) Cuff size:   large  Vitals Entered By: Romualdo Bolk, CMA (AAMA) (February 23, 2010 11:06 AM)  O2 Flow:  Room air  Serial Vital Signs/Assessments:                                PEF    PreRx  PostRx Time      O2 Sat  O2 Type     L/min  L/min  L/min   By           97  %   Room air                          [employer], CMA (AAMA)           97  %   Room air                          Madelin Headings MD  Comments: up after nebulizer .  By: Madelin Headings MD   CC: Still Coughing, Pt is having some wheezing, cough is worse at night, some nausea, No SOB. Pt is still using the proventil and d/c tussionex, mucinex and align. Pt did finish up the antibiotic.   History of Present Illness: Nina Le comes in today  for  sda  follow up of  coughing illness .. see last note .  some better  but still bac cough esp at night  to the point of  choking and almost vomiting     when coughed in the middle of night .   No fever. Feels like choking in middle of night.     slept some better  last night .      Now coughing up whit e instead of  green now.   and less .  Unsure   if inhaler working.   no fever   . never  had asthma tobacco   copd by hx  .    See last OV .   Preventive Screening-Counseling & Management  Alcohol-Tobacco     Alcohol drinks/day: 0     Smoking Status: never  Caffeine-Diet-Exercise     Caffeine use/day: <1     Does Patient Exercise: yes     Type of exercise: walking  Current Medications (verified): 1)  Norvasc 10 Mg Tabs (Amlodipine Besylate) .Marland Kitchen.. 1 By Mouth Once Daily 2)  Multivitamins   Tabs (Multiple Vitamin) 3)  Lisinopril-Hydrochlorothiazide 20-12.5 Mg Tabs (Lisinopril-Hydrochlorothiazide)  .Marland Kitchen.. 1 By Mouth Once Daily  For High Blood Pressure 4)  Hydrocodone-Acetaminophen 5-325 Mg Tabs (Hydrocodone-Acetaminophen) .Marland Kitchen.. 1 By Mouth Q 4-6 Hours As Needed Pain. 5)  Mucinex 600 Mg Xr12h-Tab (Guaifenesin) .Marland Kitchen.. 1 Tab By Mouth Two Times A Day X 10 Day 6)  Align 4 Mg Caps (Probiotic Product) .Marland Kitchen.. 1 Cap By Mouth Daily As Needed Antibiotic 7)  Proventil Hfa 108 (90 Base) Mcg/act Aers (Albuterol Sulfate) .Marland Kitchen.. 1-2 Puffs By Mouth Q 4-6 Hours As Needed Cough/sob/wheeze  8)  Tussionex Pennkinetic Er 10-8 Mg/44ml Lqcr (Hydrocod Polst-Chlorphen Polst) .Marland Kitchen.. 1 Tsp By Mouth Two Times A Day As Needed Cough Causes Sedation  Allergies (verified): No Known Drug Allergies  Past History:  Past medical, surgical, family and social histories (including risk factors) reviewed, and no changes noted (except as noted below).  Past Medical History: Reviewed history from 02/28/2008 and no changes required. Hypertension Low Back Pain Hyperlipidemia Echo Mild MR diastolic dysfunction Hx inc Alk Phos  neg lfts         LAST Td: 2002 Other: Pneumovax 2003  Consults: Dr. Vanetta Mulders  Past Surgical History: Reviewed history from 01/04/2010 and no changes required. Echo Mild Mitral Regurgitation Spine Surgery   2006 Dr Ophelia Charter  Rt Knee Surgery Normal Coronary arteries by catheterization ? 2006   Past History:  Care Management: [facility]opedics: [facility]  Family History: Reviewed history from 01/04/2010 and no changes required. Family History of Arthritis Family History Hypertension Family History Other cancer-Breast Sister  Breast cancer     Social History: Reviewed history from 01/02/2009 and no changes required. Never Smoked Alcohol use-no Drug use-no Regular exercise-yes  residence [facility] but in town frequently hh of 2  no pets.  sleep 4-5 hours.  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   Review of Systems       The patient complains of prolonged cough.  The patient denies  anorexia, fever, weight loss, weight gain, vision loss, chest pain, syncope, peripheral edema, hemoptysis, and severe indigestion/heartburn.         tired   and coughing   Physical Exam  General:  Well-developed,well-nourished,in no acute distress; alert,appropriate and cooperative throughout examination mildly congested   tired  in nad   Head:  normocephalic and atraumatic.   Eyes:  vision grossly intact.   Ears:  R ear normal, L ear normal, and no external deformities.   Nose:  mild congestion  no face pain and no discharge  Mouth:  pharynx pink and moist.   Neck:  No deformities, masses, or tenderness noted. Lungs:  Normal respiratory effort, chest expands symmetrically. Lungs are clear to auscultation, no crackles or wheezes.  aftetr neb better air movement and  looser cough    puls ox inc to 97 Heart:  Normal rate and regular rhythm. S1 and S2 normal without gallop, murmur, click, rub or other extra sounds. Pulses:  nl cpa rfill  Skin:  turgor normal and color normal.   Cervical Nodes:  No lymphadenopathy noted Psych:  Oriented X3, normally interactive, good eye contact, not anxious appearing, and not depressed appearing.     Impression & Recommendations:  Problem # 1:  ACUTE BRONCHITIS (ICD-466.0)  persistent althoughsome improved  ? if rad componenet and wonder if aceI contributing.     seems to feel a lot better after the neb   .  so some bronchospasm.   add pred  .  today   cough med as needed.    considering cost   last cough med cost 80 $ The following medications were removed from the medication list:    Augmentin 875-125 Mg Tabs (Amoxicillin-pot clavulanate) .Marland Kitchen... 1 tab by mouth two times a day x 10 days Her updated medication list for this problem includes:    Mucinex 600 Mg Xr12h-tab (Guaifenesin) .Marland Kitchen... 1 tab by mouth two times a day x 10 day    Proventil Hfa 108 (90 Base) Mcg/act Aers (Albuterol sulfate) .Marland Kitchen... 1-2 puffs by mouth  q 4-6 hours as needed cough/sob/wheeze     Tussionex Pennkinetic Er 10-8 Mg/74ml Lqcr (Hydrocod polst-chlorphen polst) .Marland Kitchen... 1 tsp by mouth two times a day as needed cough causes sedation    Hydromet 5-1.5 Mg/14ml Syrp (Hydrocodone-homatropine) .Marland Kitchen... 1-2 tsp by mouth q4-6 hours as needed cough  Orders: Albuterol Sulfate Sol 1mg  unit dose (Z6109) Nebulizer Tx (60454)  Problem # 2:  HYPERTENSION (ICD-401.9) no change  Her updated medication list for this problem includes:    Norvasc 10 Mg Tabs (Amlodipine besylate) .Marland Kitchen... 1 by mouth once daily    Lisinopril-hydrochlorothiazide 20-12.5 Mg Tabs (Lisinopril-hydrochlorothiazide) .Marland Kitchen... 1 by mouth once daily  for high blood pressure  Problem # 3:  COUGH (ICD-786.2) see above .   I see nothing in the ehr  to dx chronic lung disease and   by trackin unclear  where that dx cam from .  she denies signs of chronic lung disease.      ? if hti is an error . consider spirometry when well.    Complete Medication List: 1)  Norvasc 10 Mg Tabs (Amlodipine besylate) .Marland Kitchen.. 1 by mouth once daily 2)  Multivitamins Tabs (Multiple vitamin) 3)  Lisinopril-hydrochlorothiazide 20-12.5 Mg Tabs (Lisinopril-hydrochlorothiazide) .Marland Kitchen.. 1 by mouth once daily  for high blood pressure 4)  Hydrocodone-acetaminophen 5-325 Mg Tabs (Hydrocodone-acetaminophen) .Marland Kitchen.. 1 by mouth q 4-6 hours as needed pain. 5)  Mucinex 600 Mg Xr12h-tab (Guaifenesin) .Marland Kitchen.. 1 tab by mouth two times a day x 10 day 6)  Align 4 Mg Caps (Probiotic product) .Marland Kitchen.. 1 cap by mouth daily as needed antibiotic 7)  Proventil Hfa 108 (90 Base) Mcg/act Aers (Albuterol sulfate) .Marland Kitchen.. 1-2 puffs by mouth q 4-6 hours as needed cough/sob/wheeze 8)  Tussionex Pennkinetic Er 10-8 Mg/58ml Lqcr (Hydrocod polst-chlorphen polst) .Marland Kitchen.. 1 tsp by mouth two times a day as needed cough causes sedation 9)  Prednisone 20 Mg Tabs (Prednisone) .... Take 3 by mouth once daily for 2 days then 2 by mouth once daily for 3 days or as directed . 10)  Hydromet 5-1.5 Mg/87ml Syrp  (Hydrocodone-homatropine) .Marland Kitchen.. 1-2 tsp by mouth q4-6 hours as needed cough  Patient Instructions: 1)  take prednisone to decrease the inflammation in your bronchial tubes.  2)  Try the inhaler again .    3)  Cough med if needed and comfort.  4)  Call  after 5 days about how you are doing.  consider changing your blood pressure medication temporarily  in case contributing to your cough.  5)  ok to use afrin NS at night x 3  and saline ns any time  Prescriptions: HYDROMET 5-1.5 MG/5ML SYRP (HYDROCODONE-HOMATROPINE) 1-2 tsp by mouth q4-6 hours as needed cough  #6 oz x 0   Entered and Authorized by:   Madelin Headings MD   Signed by:   Madelin Headings MD on 02/23/2010   Method used:   Print then Give to Patient   RxID:   [id number] PREDNISONE 20 MG TABS (PREDNISONE) take 3 by mouth once daily for 2 days then 2 by mouth once daily for 3 days or as directed .  #20 x 0   Entered and Authorized by:   Madelin Headings MD   Signed by:   Madelin Headings MD on 02/23/2010   Method used:   Electronically to        [id number]* (retail)       [address] [place]  [place], [place]  [address]       Ph: [phone]       Fax: [phone]   RxID:   [id number]    Medication Administration  Medication # 1:    Medication: Albuterol Sulfate Sol 1mg  unit dose    Diagnosis: ACUTE BRONCHITIS (ICD-466.0)    Dose: 3ml    Route: inhaled    Exp Date: 01/10/2011    Lot #: [id number]    Mfr: nephron    Patient tolerated medication without complications    Given by: Romualdo Bolk, CMA (AAMA) (February 23, 2010 12:12 PM)  Orders Added: 1)  Albuterol Sulfate Sol 1mg  unit dose [J7613] 2)  Nebulizer Tx [94640] 3)  Est. Patient Level IV [40102]

## 2010-05-11 NOTE — Progress Notes (Signed)
Summary: stomach cramps  Phone Note Call from Patient Call back at Home Phone [phone]   Caller: [facility] mail Summary of Call: C/o stomach cramps after she eats. Strated a week ago. wants relief. please return her call. Initial call taken by: Warnell Forester,  January 26, 2010 12:43 PM  Follow-up for Phone Call        Pt called again. Pls call back asap today.  Follow-up by: Lucy Antigua,  January 26, 2010 1:10 PM  Additional Follow-up for Phone Call Additional follow up Details #1::        Pt will call back tomorrow if she does not feel better.   Does feel better today. Additional Follow-up by: Lynann Beaver CMA,  January 26, 2010 4:57 PM

## 2010-05-11 NOTE — Assessment & Plan Note (Signed)
Summary: coughing up blood/ssc   Vital Signs:  Patient profile:   70 year old female Menstrual status:  postmenopausal Height:      65.5 inches (166.37 cm) Weight:      257.31 pounds (116.96 kg) O2 Sat:      96 % on Room air Temp:     98.7 degrees F (37.06 degrees C) oral Pulse rate:   97 / minute BP sitting:   152 / 98  (left arm) Cuff size:   large  Vitals Entered By: Josph Macho RMA (February 10, 2010 3:11 PM)  O2 Flow:  Room air CC: Coughing up blood X6 days- little blood w/phlegm (green), head and chest congestion, throat hurts/ CF Is Patient Diabetic? No   History of Present Illness: Patient is a 70 yo AA female in today with her daughter for evaluation of a worsening cough she has had now for greater than 2 weeks. the cough has become productive of greenish sputum which is blood tinged at times no large amount of frank blood noted. Cough is severe enough to cause some posttussive gagging and some nausea interrupted her sleep and she's had poor sleep for several days now. Is struggling with fatigue, malaise, myalgias, sore throat, worsening head congestion productive of green item as well as mild headache. She denies ear pain she denies chest pain, GI or GU complaints at this time he  Current Medications (verified): 1)  Norvasc 10 Mg Tabs (Amlodipine Besylate) .Marland Kitchen.. 1 By Mouth Once Daily 2)  Multivitamins   Tabs (Multiple Vitamin) 3)  Lisinopril-Hydrochlorothiazide 20-12.5 Mg Tabs (Lisinopril-Hydrochlorothiazide) .Marland Kitchen.. 1 By Mouth Once Daily  For High Blood Pressure 4)  Hydrocodone-Acetaminophen 5-325 Mg Tabs (Hydrocodone-Acetaminophen) .Marland Kitchen.. 1 By Mouth Q 4-6 Hours As Needed Pain.  Allergies (verified): No Known Drug Allergies  Past History:  Past medical history reviewed for relevance to current acute and chronic problems. Social history (including risk factors) reviewed for relevance to current acute and chronic problems.  Past Medical History: Reviewed history from  02/28/2008 and no changes required. Hypertension Low Back Pain Hyperlipidemia Echo Mild MR diastolic dysfunction Hx inc Alk Phos  neg lfts         LAST Td: 2002 Other: Pneumovax 2003  Consults: Dr. Vanetta Mulders  Social History: Reviewed history from 01/02/2009 and no changes required. Never Smoked Alcohol use-no Drug use-no Regular exercise-yes  residence [facility] but in town frequently hh of 2  no pets.  sleep 4-5 hours.  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   Review of Systems      See HPI  Physical Exam  General:  Well-developed,well-nourished,in no acute distress; alert,appropriate and cooperative throughout examination Head:  Normocephalic and atraumatic without obvious abnormalities. No apparent alopecia or balding. Ears:  L ear normal and R TM erythema.   Nose:  mucosal erythema and mucosal edema.   Mouth:  Oral mucosa and oropharynx without lesions or exudates.  Teeth in good repair. Neck:  No deformities, masses, or tenderness noted. Lungs:  Normal respiratory effort, chest expands symmetrically. Lungs are clear to auscultation, no crackles or wheezes. slight decreased breath sounds in left base Heart:  Normal rate and regular rhythm. S1 and S2 normal without gallop, murmur, click, rub or other extra sounds. Abdomen:  Bowel sounds positive,abdomen soft and non-tender without masses, organomegaly or hernias noted. Extremities:  No clubbing, cyanosis, edema, or deformity noted with normal full range of motion of all joints.   Cervical Nodes:  No  lymphadenopathy noted Psych:  Cognition and judgment appear intact. Alert and cooperative with normal attention span and concentration. No apparent delusions, illusions, hallucinations   Impression & Recommendations:  Problem # 1:  ACUTE BRONCHITIS (ICD-466.0)  Her updated medication list for this problem includes:    Augmentin 875-125 Mg Tabs (Amoxicillin-pot clavulanate) .Marland Kitchen... 1 tab by mouth two times a  day x 10 days    Mucinex 600 Mg Xr12h-tab (Guaifenesin) .Marland Kitchen... 1 tab by mouth two times a day x 10 day    Proventil Hfa 108 (90 Base) Mcg/act Aers (Albuterol sulfate) .Marland Kitchen... 1-2 puffs by mouth q 4-6 hours as needed cough/sob/wheeze    Tussionex Pennkinetic Er 10-8 Mg/58ml Lqcr (Hydrocod polst-chlorphen polst) .Marland Kitchen... 1 tsp by mouth two times a day as needed cough causes sedation  Orders: T-2 View CXR (71020TC)  Problem # 2:  HEMOPTYSIS UNSPECIFIED (ICD-786.30) Very slight striking of blood in sputum with vigorous coughing. Patient will report persistent or worsening hemoptysis   Problem # 3:  HYPERTENSION (ICD-401.9)  Her updated medication list for this problem includes:    Norvasc 10 Mg Tabs (Amlodipine besylate) .Marland Kitchen... 1 by mouth once daily    Lisinopril-hydrochlorothiazide 20-12.5 Mg Tabs (Lisinopril-hydrochlorothiazide) .Marland Kitchen... 1 by mouth once daily  for high blood pressure Patient unsure if she took her meds today, improved numbers upon repeat check, no changes in therapy today  Complete Medication List: 1)  Norvasc 10 Mg Tabs (Amlodipine besylate) .Marland Kitchen.. 1 by mouth once daily 2)  Multivitamins Tabs (Multiple vitamin) 3)  Lisinopril-hydrochlorothiazide 20-12.5 Mg Tabs (Lisinopril-hydrochlorothiazide) .Marland Kitchen.. 1 by mouth once daily  for high blood pressure 4)  Hydrocodone-acetaminophen 5-325 Mg Tabs (Hydrocodone-acetaminophen) .Marland Kitchen.. 1 by mouth q 4-6 hours as needed pain. 5)  Augmentin 875-125 Mg Tabs (Amoxicillin-pot clavulanate) .Marland Kitchen.. 1 tab by mouth two times a day x 10 days 6)  Mucinex 600 Mg Xr12h-tab (Guaifenesin) .Marland Kitchen.. 1 tab by mouth two times a day x 10 day 7)  Align 4 Mg Caps (Probiotic product) .Marland Kitchen.. 1 cap by mouth daily as needed antibiotic 8)  Proventil Hfa 108 (90 Base) Mcg/act Aers (Albuterol sulfate) .Marland Kitchen.. 1-2 puffs by mouth q 4-6 hours as needed cough/sob/wheeze 9)  Tussionex Pennkinetic Er 10-8 Mg/22ml Lqcr (Hydrocod polst-chlorphen polst) .Marland Kitchen.. 1 tsp by mouth two times a day as needed  cough causes sedation  Patient Instructions: 1)  Take 650 - 1000 mg of tylenol every 4-6 hours as needed for relief of pain or comfort of fever. Avoid taking more than 3000 mg in a 24 hour period( can cause liver damage in higher doses).  2)  Take your antibiotic as prescribed until ALL of it is gone, but stop if you develop a rash or swelling and contact our office as soon as possible.  3)  Acute Bronchitis symptoms for less then 10 days are not  helped by antibiotics. Take over the counter cough medications. Call if no improvement in 5-7 days, sooner if increasing cough, fever, or new symptoms ( shortness of breath, chest pain) .  4)  Push clear fluids Prescriptions: TUSSIONEX PENNKINETIC ER 10-8 MG/5ML LQCR (HYDROCOD POLST-CHLORPHEN POLST) 1 tsp by mouth two times a day as needed cough causes sedation  #4 oz x 1   Entered and Authorized by:   Danise Edge MD   Signed by:   Danise Edge MD on 02/10/2010   Method used:   Print then Give to Patient   RxID:   [id number] PROVENTIL HFA 108 (90 BASE) MCG/ACT AERS (ALBUTEROL SULFATE)  1-2 puffs by mouth q 4-6 hours as needed cough/sob/wheeze  #1 hfa x 1   Entered and Authorized by:   Danise Edge MD   Signed by:   Danise Edge MD on 02/10/2010   Method used:   Electronically to        [id number] (retail)       [address] [place], [place]  [address]       Ph: [phone]       Fax: [phone]   RxID:   [id number] AUGMENTIN 875-125 MG TABS (AMOXICILLIN-POT CLAVULANATE) 1 tab by mouth two times a day x 10 days  #20 x 0   Entered and Authorized by:   Danise Edge MD   Signed by:   Danise Edge MD on 02/10/2010   Method used:   Electronically to        [phone] (retail)       [address] [place], [place]  [address]       Ph: [phone]       Fax: [phone]   RxID:   [id number]    Orders Added: 1)  T-2 View CXR  [71020TC] 2)  Est. Patient Level IV [[address]]

## 2010-05-11 NOTE — Progress Notes (Signed)
Summary: stop take cough med 3 times a day  Phone Note Call from Patient Call back at Home Phone [phone]   Caller: Mom Summary of Call: pt is doing well. Pt would like to stop taking cough med 3 times a day due to dizziness. Please advise Initial call taken by: Heron Sabins,  March 02, 2010 9:32 AM  Follow-up for Phone Call        call patient   ok to stop cough med .   if cough is resolving .  if not resolved in  another 2 weeks or soo call us. or if worse   Follow-up by: Madelin Headings MD,  March 02, 2010 12:54 PM  Additional Follow-up for Phone Call Additional follow up Details #1::        Pt aware. Additional Follow-up by: Romualdo Bolk, CMA (AAMA),  March 02, 2010 1:08 PM

## 2010-05-11 NOTE — Assessment & Plan Note (Signed)
Summary: follow up/ssc   Vital Signs:  Patient profile:   70 year old female Menstrual status:  postmenopausal Weight:      260 pounds Pulse rate:   66 / minute BP sitting:   110 / 70  (right arm) Cuff size:   large  Vitals Entered By: Romualdo Bolk, CMA (AAMA) (June 19, 2009 1:40 PM) CC: follow-up visit, Hypertension Management   History of Present Illness: Nina Le comesin today for   for follow up of multiple medical problems . She comes form [employer] on the wrong appt day but is being worked in Teacher, early years/pre.  She would like handicapped  form signed to continue.  BP thinks its ok . Taking medication without problem  . Denies se. no cp  no change in exercise tolerance . Apparently okwhen went to ortho. Back : has had 2 shots so far and no sign help except for a few weeks.    Then if not help. Dr Ophelia Charter . some narrowing in spine.   No radicular symptom .  except ? sciateic problem.   Also says has some right arms symptom at times  but is left handed. No weakness or numbness.     doesnt want surgery.   Weight . No changes  trying to lose .   still drinks a pepsi here and there.   Hypertension History:      She complains of headache, dyspnea with exertion, and peripheral edema, but denies chest pain, palpitations, orthopnea, PND, visual symptoms, neurologic problems, syncope, and side effects from treatment.  She notes no problems with any antihypertensive medication side effects.        Positive major cardiovascular risk factors include female age 62 years old or older, hyperlipidemia, and hypertension.  Negative major cardiovascular risk factors include non-tobacco-user status.     Preventive Screening-Counseling & Management  Alcohol-Tobacco     Alcohol drinks/day: 0     Smoking Status: never  Caffeine-Diet-Exercise     Caffeine use/day: <1     Does Patient Exercise: yes     Type of exercise: walking  Current Medications (verified): 1)  Norvasc 10 Mg  Tabs (Amlodipine Besylate) .Marland Kitchen.. 1 By Mouth Once Daily 2)  Multivitamins   Tabs (Multiple Vitamin) 3)  Lisinopril-Hydrochlorothiazide 20-12.5 Mg Tabs (Lisinopril-Hydrochlorothiazide) .Marland Kitchen.. 1 By Mouth Once Daily  For High Blood Pressure 4)  Hydrocodone-Acetaminophen 5-325 Mg Tabs (Hydrocodone-Acetaminophen) .Marland Kitchen.. 1 By Mouth Q 4-6 Hours As Needed Pain.  Allergies (verified): No Known Drug Allergies  Past History:  Past medical, surgical, family and social histories (including risk factors) reviewed, and no changes noted (except as noted below).  Past Medical History: Reviewed history from 02/28/2008 and no changes required. Hypertension Low Back Pain Hyperlipidemia Echo Mild MR diastolic dysfunction Hx inc Alk Phos  neg lfts         LAST Td: 2002 Other: Pneumovax 2003  Consults: Dr. Vanetta Mulders  Past Surgical History: Reviewed history from 01/02/2009 and no changes required. Echo Mild Mitral Regurgitation Spine Surgery   2006 Dr Ophelia Charter  Rt Knee Surgery Normal Coronary arteries by catheterization  Past History:  Care Management: [facility]opedics: [facility]  Family History: Reviewed history from 11/16/2006 and no changes required. Family History of Arthritis Family History Hypertension Family History Other cancer-Breast  Social History: Reviewed history from 01/02/2009 and no changes required. Never Smoked Alcohol use-no Drug use-no Regular exercise-yes  residence [facility] but in town frequently hh of 2  no pets.  sleep 4-5 hours.  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   Review of Systems  The patient denies anorexia, fever, weight loss, weight gain, vision loss, decreased hearing, hoarseness, chest pain, syncope, prolonged cough, hemoptysis, abdominal pain, melena, hematochezia, severe indigestion/heartburn, muscle weakness, transient blindness, depression, abnormal bleeding, enlarged lymph nodes, and angioedema.    Physical Exam  General:  alert,  well-developed, well-nourished, and well-hydrated.  AMBULATORY WITH CANE  Head:  normocephalic and atraumatic.   Eyes:  vision grossly intact,glassses   Ears:  no external deformities.   Neck:  no masses.   Lungs:  Normal respiratory effort, chest expands symmetrically. Lungs are clear to auscultation, no crackles or wheezes. Heart:  normal rate, regular rhythm, no gallop, no rub, no JVD, and no lifts.   Abdomen:  soft and non-tender.   Msk:  some tenderness ls area  no focal tenderness  Pulses:  pulses intact without delay   Extremities:  trace left pedal edema, 1+ left pedal edema, trace right pedal edema, and 1+ right pedal edema.   Neurologic:  alert & oriented X3 and strength normal in all extremities.  antalgic gait  Skin:  turgor normal, color normal, no ecchymoses, and no petechiae.   Cervical Nodes:  No lymphadenopathy noted Psych:  Oriented X3, normally interactive, good eye contact, not anxious appearing, and not depressed appearing.  cognition appears normal   Impression & Recommendations:  Problem # 1:  HYPERTENSION (ICD-401.9)  controlled   slight edema with norvasc. Her updated medication list for this problem includes:    Norvasc 10 Mg Tabs (Amlodipine besylate) .Marland Kitchen... 1 by mouth once daily    Lisinopril-hydrochlorothiazide 20-12.5 Mg Tabs (Lisinopril-hydrochlorothiazide) .Marland Kitchen... 1 by mouth once daily  for high blood pressure  BP today: 110/70 Prior BP: 120/80 (01/02/2009)  10 Yr Risk Heart Disease: 7 % Prior 10 Yr Risk Heart Disease: 11 % (01/02/2009)  Labs Reviewed: K+: 5.0 (01/02/2009) Creat: : 0.7 (01/02/2009)   Chol: 190 (02/28/2008)   HDL: 48.4 (02/28/2008)   LDL: 128 (02/28/2008)   TG: 69 (02/28/2008)  Orders: Prescription Created Electronically [phone])  Problem # 2:  BACK PAIN (ICD-724.5) handicapped form signed   most problematic   .   counseled importance of weight control for this to improve. assumption this is DJD but dont have the recent records.      Problem # 3:  OBESITY (ICD-278.00) counseled   no change   cut out pepsi  Problem # 4:  MITRAL REGURGITATION, MILD (ICD-396.3) no evidence of problem today   Complete Medication List: 1)  Norvasc 10 Mg Tabs (Amlodipine besylate) .Marland Kitchen.. 1 by mouth once daily 2)  Multivitamins Tabs (Multiple vitamin) 3)  Lisinopril-hydrochlorothiazide 20-12.5 Mg Tabs (Lisinopril-hydrochlorothiazide) .Marland Kitchen.. 1 by mouth once daily  for high blood pressure 4)  Hydrocodone-acetaminophen 5-325 Mg Tabs (Hydrocodone-acetaminophen) .Marland Kitchen.. 1 by mouth q 4-6 hours as needed pain.  Hypertension Assessment/Plan:      The patient's hypertensive risk group is category B: At least one risk factor (excluding diabetes) with no target organ damage.  Her calculated 10 year risk of coronary heart disease is 7 %.  Today's blood pressure is 110/70.  Her blood pressure goal is < 140/90.  Patient Instructions: 1)  Please schedule a follow-up appointment in 6 months .  2)  BMP prior to visit, ICD-9: 401.9 3)  Lipid panel prior to visit ICD-9 :  4)  Hepatic Panel prior to visit ICD-9:  272.4  5)  CBCdiff  back pain . Prescriptions: NORVASC 10 MG TABS (AMLODIPINE BESYLATE) 1 by mouth once daily  #60 x 3   Entered and Authorized by:   Madelin Headings MD   Signed by:   Madelin Headings MD on 06/19/2009   Method used:   Electronically to        [id number] (retail)       [address] [place], [place]  [address]       Ph: [phone]       Fax: [phone]   RxID:   [id number]  greater than 50% of visit spent in counseling  25 minutes.

## 2010-05-17 ENCOUNTER — Telehealth: Payer: Self-pay | Admitting: Internal Medicine

## 2010-05-17 MED ORDER — LISINOPRIL-HYDROCHLOROTHIAZIDE 20-12.5 MG PO TABS: 1.0000 | ORAL_TABLET | Freq: Every day | ORAL | Status: DC

## 2010-05-17 NOTE — Telephone Encounter (Signed)
Pt called and said that [facility] sent refill req last thurs or Friday for Lisinopril/HCTZ 20-12.5mg , has not been rcvd. Pls call this in to [facility]  [phone].  Pt is completely out of med.

## 2010-05-17 NOTE — Telephone Encounter (Signed)
Rx was sent electronically on 05/13/10 in centricity. I tried to call pt back but mail box was full and couldn't leave a message. I resent this rx in EPIC.

## 2010-06-28 ENCOUNTER — Ambulatory Visit (INDEPENDENT_AMBULATORY_CARE_PROVIDER_SITE_OTHER): Payer: Medicare Other | Admitting: Internal Medicine

## 2010-06-28 ENCOUNTER — Ambulatory Visit (INDEPENDENT_AMBULATORY_CARE_PROVIDER_SITE_OTHER)
Admission: RE | Admit: 2010-06-28 | Discharge: 2010-06-28 | Disposition: A | Discharge status: Home / Self Care | Payer: Medicare Other | Source: Ambulatory Visit | Attending: Internal Medicine | Admitting: Internal Medicine

## 2010-06-28 ENCOUNTER — Encounter: Payer: Self-pay | Admitting: Internal Medicine

## 2010-06-28 VITALS — BP 120/80 | HR 107 | Temp 98.2°F | Wt 253.0 lb

## 2010-06-28 DIAGNOSIS — R05 Cough: Secondary | ICD-10-CM

## 2010-06-28 DIAGNOSIS — J019 Acute sinusitis, unspecified: Secondary | ICD-10-CM

## 2010-06-28 DIAGNOSIS — R059 Cough, unspecified: Secondary | ICD-10-CM

## 2010-06-28 DIAGNOSIS — J209 Acute bronchitis, unspecified: Secondary | ICD-10-CM

## 2010-06-28 DIAGNOSIS — I519 Heart disease, unspecified: Secondary | ICD-10-CM

## 2010-06-28 DIAGNOSIS — I5189 Other ill-defined heart diseases: Secondary | ICD-10-CM

## 2010-06-28 DIAGNOSIS — R062 Wheezing: Secondary | ICD-10-CM

## 2010-06-28 MED ORDER — HYDROCODONE-HOMATROPINE 5-1.5 MG/5ML PO SYRP: 5.0000 mL | ORAL_SOLUTION | ORAL | Status: AC | PRN

## 2010-06-28 MED ORDER — AZITHROMYCIN 250 MG PO TABS: 250.0000 mg | ORAL_TABLET | ORAL | Status: AC

## 2010-06-28 MED ORDER — PREDNISONE 20 MG PO TABS: 20.0000 mg | ORAL_TABLET | Freq: Every day | ORAL | Status: AC

## 2010-06-28 NOTE — Progress Notes (Signed)
Pt aware of results 

## 2010-06-28 NOTE — Progress Notes (Signed)
  Subjective:    Patient ID: Nina Le, female    DOB: 24-Dec-1940, 70 y.o.   MRN: [id number]  HPI the patient comesin with family member for acute problem. Onset 2 weeks ago of increased throat clearing and then som better but never resolved and inc cougning taking tussin .  ? If has fever and inc ur congestion in the last few days .  Feels not getting better and poss worse.   No hempotysis no wheezing no nvd . Feels badly    Past Medical History  Diagnosis Date  . Low back pain   . Diastolic dysfunction     Mild Echo  . Alkaline phosphatase elevation     hx-neg lfts  . Hyperlipidemia   . Hypertension    Past Surgical History  Procedure Date  . US echocardiography     mild mitral regurgitation  . Knee surgery     Rt  . Cardiac catheterization     nl coronary 2006   . Spine surgery 2006    Dr. Ophelia Charter    reports that she has never smoked. She does not have any smokeless tobacco history on file. She reports that she does not drink alcohol or use illicit drugs. family history includes Arthritis in her mother; Breast cancer in her mother and sister; Heart disease in her father; and Hypertension in her mother. No Known Allergies   Review of Systems Neg cp some sob no rigors .  No falling no uti sx and no itching sneezing . No Hb   Rest as per hpin    Objective:   Physical Exam WDWN in nad  Looks tored no breathless and nl resp at rest repeat sat 9 98 % ra.   HEENT: Normocephalic ;atraumatic , Eyes;  PERRL, EOMs  Full, lids and conjunctiva clear,,Ears: no deformities, canals nl, TM landmarks normal, Nose: no deformity or discharge congested face non tender   Mouth : OP clear without lesion or edema . Chest:  Clear to A&P without wheezes rales or rhonchi prolonged exp sounds  bs =  CV:  S1-S2 no gallops or murmurs peripheral perfusion is normal Neuro no focal grossly intact .  Skin no acute rashes       Assessment & Plan:  SOb  Suspect bronchitis vs early pneumonia    No dx  of cold    Get x ray will decide on rx depending on  X ray results  .  Poss acid relux disease also  Ht  Controlled  If recurrrent or persistnet consider further pulm evaluation   ex; pfts  Contact number  [phone]

## 2010-06-28 NOTE — Patient Instructions (Signed)
Get chest x ray ans will notify you of  Results and  Call in antibiotic based on the results  rec call of recheck if not a lot better either way in 4-5 days

## 2010-07-03 ENCOUNTER — Encounter: Payer: Self-pay | Admitting: Internal Medicine

## 2010-07-03 DIAGNOSIS — I5189 Other ill-defined heart diseases: Secondary | ICD-10-CM | POA: Insufficient documentation

## 2010-07-03 DIAGNOSIS — J209 Acute bronchitis, unspecified: Secondary | ICD-10-CM | POA: Insufficient documentation

## 2010-08-11 ENCOUNTER — Other Ambulatory Visit: Payer: Self-pay | Admitting: Internal Medicine

## 2010-08-27 NOTE — Cardiovascular Report (Signed)
NAME:  Nina [name], Nina [name]                           ACCOUNT NO.:  [id number]   MEDICAL RECORD NO.:  [id number]                   PATIENT TYPE:  OIB   LOCATION:  2853                                 FACILITY:  [facility]   PHYSICIAN:  Charlies Constable, M.D. [facility]              DATE OF BIRTH:  09/26/1940   DATE OF PROCEDURE:  10/21/2003  DATE OF DISCHARGE:                              CARDIAC CATHETERIZATION   CLINICAL HISTORY:  Ms. Profit is [name] 70 years old and was seen for preoperative  evaluation prior to back surgery.  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  She had no  symptoms.  She does have risk factors including hypertension, obesity and  positive family history of coronary heart disease.  We made [name] decision to  evaluate with cardiac catheterization.   PROCEDURE:  The procedure was performed via the right femoral artery using  arterial sheath and 6 French preformed coronary catheters. [name] front wall  arterial puncture was performed and Omnipaque contrast was used.  Distal  aortogram was performed to rule out renal vascular causes for hypertension.  The right femoral artery was closed with Angio-Seal at the end of the  procedure.  The patient tolerated the procedure well and left the laboratory  in satisfactory condition.   RESULTS:  The aortic pressure was 168/80 with mean of 115.  Left ventricular  pressure was 168/8.   Left main:  The left main coronary was free of significant disease.   Left anterior descending artery:  The left anterior descending artery gave  rise to three diagonal branches and two septal perforators.  Proximal LAD  was irregular, but there was no significant obstruction.   Circumflex artery:  The circumflex artery gave rise to [name] small marginal  branch, large marginal branch, and [name] posterior lateral branch.  These  vessels were free of significant disease.   Right coronary artery:  The right coronary artery was [name]  moderate size vessel  and gave rise to [name] posterior descending branch and two posterior lateral  branches.  These vessels were free of significant disease.   LEFT VENTRICULOGRAM:  The left ventriculogram performed in the RAO  projection showed good wall motion with no areas of hypokinesis.  The  estimated ejection fraction was 65%.   DISTAL AORTOGRAM:  Distal aortogram was performed which showed patent renal  arteries and no significant aortoiliac obstruction.   CONCLUSIONS:  Normal coronary angiography and normal left ventricular wall  motion.   RECOMMENDATIONS:  Reassurance.  In view of these findings, I do not think  the abnormal ECG has any significance in terms of indication for coronary  heart disease.  Her LV could be slightly hypertrophied and will get an  echocardiogram to rule out hypertrophic cardiomyopathy.  This may be related  to hypertensive disease.  I think she would be okay to proceed  with lumbar  back surgery by Dr. Ophelia Charter.                                               Charlies Constable, M.D. [facility]    BB/MEDQ  D:  10/21/2003  T:  10/21/2003  Job:  [id number]   cc:   Loraine Leriche C. Ophelia Charter, M.D.  [address] [place], [place] [address]  Fax: [phone]

## 2010-08-27 NOTE — Op Note (Signed)
NAME:  Nina [name], Nina [name]                 ACCOUNT NO.:  [id number]   MEDICAL RECORD NO.:  [id number]          PATIENT TYPE:  OIB   LOCATION:  2861                         FACILITY:  [facility]   PHYSICIAN:  Mark C. Ophelia Charter, M.D.    DATE OF BIRTH:  01/26/1941   DATE OF PROCEDURE:  02/16/2004  DATE OF DISCHARGE:                                 OPERATIVE REPORT   PREOPERATIVE DIAGNOSIS:  Right knee degenerative medial meniscal tear,  chondromalacia patella patellofemoral joint.   POSTOPERATIVE DIAGNOSIS:  Right knee degenerative medial meniscal tear,  chondromalacia patella patellofemoral joint.   OPERATION PERFORMED:  Diagnostic and operative arthroscopy, right knee.  Partial medial meniscectomy, medial compartment debridement and  patellofemoral debridement.   SURGEON:  Mark C. Ophelia Charter, M.D.   ASSISTANT:  Sandrea Matte, P.A.   ANESTHESIA:  General.   DESCRIPTION OF PROCEDURE:  After induction of anesthesia, well leg holder to  the left and leg holder on the right.  No proximal tourniquet was applied  due to the patient's large leg size.  The leg was prepped with DuraPrep.  The usual arthroscopic sheets and drapes were applied.  The inflow was  placed through superolateral portal.  Medial and lateral parapatellar tendon  portals were used for scope and probe placement.  Patellofemoral joint  showed grade 3 chondromalacia of the trochlear groove and patella.  This was  trimmed smooth.  There was a large transverse suprapatellar plica with a  small opening and this was enlarged.  No inflamed medial plica was present.  Lateral compartment was normal other than some mild fraying at the edge of  the meniscus.  The meniscus was probed and was intact.  The medial  compartment showed a complex tear of the posterior and midportion of the  medial meniscus.  The medial femoral condyle showed some flattening and wear  with grade 3 changes and on the tibial side, grade 3 changes and an area of  grade 4  along the posteromedial rim of the meniscus which was further  exposed through the meniscal tear and debridement of meniscal fragments.  There is no remaining meniscus posteriorly and medially.  The anterior  portion of the medial meniscus was intact.  After thorough irrigation, air  aspiration, suture was applied.  Marcaine infiltration.  Postoperative  dressing, Ace wrap.  Outpatient surgery was appropriate for treatment of  this condition.  Office follow-up one week.       MCY/MEDQ  D:  02/16/2004  T:  02/16/2004  Job:  [id number]

## 2010-08-27 NOTE — Procedures (Signed)
NAME:  [name], [name]                           ACCOUNT NO.:  [id number]   MEDICAL RECORD NO.:  [id number]                   PATIENT TYPE:   LOCATION:                                       FACILITY:  [facility]   PHYSICIAN:  Erick Colace, M.D.           DATE OF BIRTH:  [birth year]   DATE OF PROCEDURE:  08/15/2003  DATE OF DISCHARGE:                                 OPERATIVE REPORT   PROCEDURE:  Left sacroiliac joint injection with fluoroscopic guidance.   PHYSICIAN:  Erick Colace, M.D.   INDICATIONS FOR PROCEDURE:  Left buttock pain, with good relief from two  previous sacroiliac joint injections, but the effect wore off after two to  three weeks' time.  In the interim she has been switched from oxycodone down  to hydrocodone.   INFORMED CONSENT:  An informed consent was obtained after discussing the  risks and benefits of the procedure with the patient.  These include  bleeding, bruising and infection, and the patient has provided a written  informed consent.   DESCRIPTION OF PROCEDURE:  The patient is placed prone on the fluoroscopy  table.  Betadine prep and a sterile drape.  A #25 gauge, 1-1/4 inch needle  was used to anesthetize the skin and subcutaneous tissue with 1% lidocaine x  3 mL.  A #22 gauge, 3-1/2 inch spinal needle was manipulated in the left SI  joint under fluoroscopic guidance.  This was confirmed in both the AP and  lateral images.  Injection of Omnipaque 180.5 mL showed no evidence of  intravascular uptake.  A solution containing 1 mL of 40 mg per mL of Kenalog  plus 0.5 mL of 2% methylparaben-free lidocaine were injected.  Post-injections instructions were given.  This could be her last sacroiliac  joint injection.  Depending upon the clinic outcome, denervation of the SI  joint with regular frequency neurotomy possibility.   DISPOSITION:  I will not schedule a followup.  The patient will follow up  with Dr. Princess Perna.                          Erick Colace, M.D.    AEK/MEDQ  D:  08/15/2003 10:19:21  T:  08/15/2003 10:45:01  Job:  [id number]   cc:   Princess Perna, M.D.  [place], [place]

## 2010-08-27 NOTE — Op Note (Signed)
NAME:  Nina [name]                           ACCOUNT NO.:  [id number]   MEDICAL RECORD NO.:  [id number]                   PATIENT TYPE:  OIB   LOCATION:  5025                                 FACILITY:  [facility]   PHYSICIAN:  Mark C. Ophelia Charter, M.D.                 DATE OF BIRTH:  1941-01-11   DATE OF PROCEDURE:  11/12/2003  DATE OF DISCHARGE:                                 OPERATIVE REPORT   PREOPERATIVE DIAGNOSIS:  L5-S1 degenerative disk disease with biforaminal  stenosis.   POSTOPERATIVE DIAGNOSIS:  L5-S1 degenerative disk disease with biforaminal  stenosis.   PROCEDURE:  L5-S1 microdiskectomy, decompression and bilateral  foraminotomies.   SURGEON:  Mark C. Ophelia Charter, M.D.   ASSISTANT:  Sandrea Matte, P.[name].   ANESTHESIA:  GOT plus Marcaine in the skin local.   BRIEF HISTORY:  This 70 year old female was injured on the job and has had  persistent symptoms with an MRI scan showing degenerative disk and mild  foraminal stenosis at left L5-S1.  She has had persistent left leg pain not  responsive to conservative treatment including epidurals and later disk  testing that confirmed an annular tear on the left at L5-S1 with foraminal  stenosis.   DESCRIPTION OF PROCEDURE:  After excellent general anesthesia with  orotracheal intubation and prep with Ancef prophylaxis, the back was prepped  with Duraprep.  The left buttocks had been labeled L5-S1 HNP and the back  was prepped with Duraprep with careful padding to the patient with forearm  pads and shoulders.  The area was squared with towels and [name] Betadine Vi-  Drape was applied.  Sheets and drapes were applied.  Needle localization  with [name] spinal needle on the right at L5-S1 and cross table lateral x-ray  confirmed that the needle was set at L5-S1.  The patient had [name] small disk at  S1-S2 and [name] myelogram CT, next the MRI scan and next the portable x-ray were  used to confirm that this was the appropriate and correct level.  The  inferior aspect of L5 spinous process was removed and the top portion of the  S1 spinous process.  The lamina was removed from above and below.  The  foraminotomies were performed and the bone was smoothed out to the level of  the pedicle.  There were thick hypertrophic ligaments and there was scar  tissue where the left L5 nerve root was scarred down laterally up against  the pedicle.  This was dissected loose and the nerve root was gently pulled  toward the midline. The annulus was incised with [name] scalpel and some chunks  of degenerative disk material were removed. [name] minimal amount of degenerative  tear was left remaining on the nucleus region.  Up and down micro  pituitaries, Epstein curettes and up and down regular pituitaries were used.  [name] hockey stick was used to palpate around and some  additional bone was  removed out the foramen on the left side.  I did [name] procedure similarly on  the right side, but no diskectomy was performed on the right.  The disk was  not protruding.  The nerve root was not scarred down.  On the left side  there was [name] little bit of the disk material that had migrated inferiorly  underneath the ligament for about 4 to 5 mm with some disk material sitting  on the S1 body and this disk material was removed as well as the small  portion of annulus over the top.  After irrigation with saline solution the  fascia was re-closed with 0 Vicryl and 2-0 Vicryl to the  subcutaneous tissue.  The patient had about four inches of adipose tissue  between the fascia and the dermis.  The dermis was closed with skin staples  with Marcaine infiltration, 4 x 4's, ABD and tape.  Instrument count and  needle count were correct.                                               Mark C. Ophelia Charter, M.D.    MCY/MEDQ  D:  11/12/2003  T:  11/12/2003  Job:  [id number]

## 2010-08-27 NOTE — Procedures (Signed)
NAME:  [place], [place] A                           ACCOUNT NO.:  [id number]   MEDICAL RECORD NO.:  [id number]                   PATIENT TYPE:  REC   LOCATION:  [facility]                                  FACILITY:  [facility]   PHYSICIAN:  Erick Colace, M.D.           DATE OF BIRTH:  May 11, 1940   DATE OF PROCEDURE:  06/23/2003  DATE OF DISCHARGE:                                 OPERATIVE REPORT   REFERRING PHYSICIAN:  Princess Perna, M.D.   PROCEDURE:  Left sacroiliac joint injection under fluoroscopic guidance.   INDICATIONS FOR PROCEDURE:  Left buttock, low back pain.  Unresponsive to  Medrol Dosepak and only partially responsive to oxycodone.   ALLERGIES:  CELEBREX.  No contrast dye, lidocaine or Kenalog allergy.   CURRENT MEDICATIONS:  1. Oxycodone.  2. Maxzide.  3. Verelan.  4. Denies specifically aspirin usage or Warfarin/Coumadin.   Informed consent was obtained after describing risks and benefits of the  procedure to the patient.  These included bleeding, bruising, infection as  well as loss of bowel and bladder function and lower extremity loss of  sensation and paralysis.  The patient has given written informed consent.   The patient placed prone on fluoroscopy table.  Betadine prep, sterile  drape.  The 25-gauge __________ needle was used to anesthetize skin and  subcutaneous tissues.  Then a 25-gauge 3-1/2 inch  spinal needle was  manipulated into the left SI joint under fluoroscopic guidance.  This is  confirmed with both AP and lateral images.  Initial injection with Omnipaque  180 x 0.5 mL showed intravascular uptake, therefore, the needle was  withdrawn approximately 0.5 cm but still in the joint and another 0.3 mL of  Omnipaque 180 demonstrated no evidence of intravascular uptake.  There was  appropriate joint arthrogram.  The patient then had a solution containing 40  mg/mL Kenalog x 0.5 mL plus methylparaben free lidocaine 1% x 0.5 mL  injected.  The patient tolerated  the procedure well.  Post injection  instructions given.  She is to follow up in three weeks, assess efficacy.  If not effective at all, consider facet joint injections.  If partially  effective, then repeat injection.                                                Erick Colace, M.D.   AEK/MEDQ  D:  06/23/2003 13:52:39  T:  06/23/2003 14:51:26  Job:  [id number]   cc:   Noralyn Pick, M.D.  [place], [place]

## 2010-08-27 NOTE — Procedures (Signed)
NAME:  Nina [name]                           ACCOUNT NO.:  [id number]   MEDICAL RECORD NO.:  [id number]                   PATIENT TYPE:  REC   LOCATION:  [facility]                                  FACILITY:  [facility]   PHYSICIAN:  Nina [name], M.D.           DATE OF BIRTH:  [birth year]   DATE OF PROCEDURE:  07/17/2003  DATE OF DISCHARGE:                                 OPERATIVE REPORT   REFERRING PHYSICIAN:  Dr. Princess [name], [place], [place].   PROCEDURE:  Left sacroiliac joint injection under fluoroscopic guidance.   INDICATIONS FOR PROCEDURE:  Left buttock pain.  Partial relief from previous  sacroiliac joint injection.  Had very good relief initially and then it has  worn off over the last week or so.  Has, however, been able to reduce her  oxycodone from four [name] day to two [name] day.   In her current history, no new illnesses.  No new allergies or medications.   Informed consent was obtained after describing risks and benefits of the  procedure with the patient.  These included bleeding, bruising, infection,  as well as loss of bowel and bladder function, lower extremity loss of  sensation and paralysis.  The patient has given written informed consent.   The patient placed prone on fluoroscopy table.  Betadine prep, sterile  drape.  [name] 25-gauge 1-1/4 inch needle was used in the skin and subcutaneous  tissues.  Then [name] 22-gauge 3-1/2 inch spinal needle was manipulated in the  left SI joint under fluoroscopic guidance.  This confirmed on both AP and  lateral images.  Initial injection of Omnipaque 0.5 mL showed some  extravasation.  This was felt to be ventral __________ of the SI joint.  No  intravascular uptake was noted.  The patient had solution containing 0.5 mL  of 40 mg/mL Kenalog plus 0.5 mL of 2% methylparaben-free lidocaine.  The  patient tolerated the procedure well.  Post injection instructions given.  She is to follow up in three weeks for possible reinjection  versus referral  for sacroiliac joint radiofrequency.                                                Nina [name], M.D.    AEK/MEDQ  D:  07/17/2003 12:58:00  T:  07/17/2003 14:15:13  Job:  [id number]   cc:   Nina [name], M.D.  [place], [place]

## 2010-09-02 ENCOUNTER — Other Ambulatory Visit: Payer: Self-pay | Admitting: Internal Medicine

## 2010-09-02 DIAGNOSIS — R922 Inconclusive mammogram: Secondary | ICD-10-CM

## 2010-09-15 ENCOUNTER — Other Ambulatory Visit: Payer: Self-pay | Admitting: Internal Medicine

## 2010-09-15 MED ORDER — AMLODIPINE BESYLATE 10 MG PO TABS: 10.0000 mg | ORAL_TABLET | Freq: Every day | ORAL | Status: DC

## 2010-09-15 MED ORDER — LISINOPRIL-HYDROCHLOROTHIAZIDE 20-12.5 MG PO TABS: 1.0000 | ORAL_TABLET | Freq: Every day | ORAL | Status: DC

## 2010-09-15 NOTE — Telephone Encounter (Signed)
Rx sent to pharmacy   

## 2010-09-15 NOTE — Telephone Encounter (Signed)
Pt called to req a new script for Lisinopril HCTZ 20-12.5 mg #90 with refills and also Amlodipine 10 mg #90 with refills.  Pls call in to [facility] in [place].

## 2010-09-20 ENCOUNTER — Ambulatory Visit
Admission: RE | Admit: 2010-09-20 | Discharge: 2010-09-20 | Disposition: A | Discharge status: Home / Self Care | Payer: Medicare Other | Source: Ambulatory Visit | Attending: Internal Medicine | Admitting: Internal Medicine

## 2010-09-20 DIAGNOSIS — R922 Inconclusive mammogram: Secondary | ICD-10-CM

## 2011-02-14 ENCOUNTER — Other Ambulatory Visit: Payer: Self-pay | Admitting: Internal Medicine

## 2011-03-11 ENCOUNTER — Encounter: Payer: Self-pay | Admitting: Internal Medicine

## 2011-03-11 ENCOUNTER — Ambulatory Visit (INDEPENDENT_AMBULATORY_CARE_PROVIDER_SITE_OTHER): Payer: Medicare Other | Admitting: Internal Medicine

## 2011-03-11 VITALS — BP 130/90 | HR 66 | Temp 98.5°F | Wt 252.0 lb

## 2011-03-11 DIAGNOSIS — E669 Obesity, unspecified: Secondary | ICD-10-CM

## 2011-03-11 DIAGNOSIS — M545 Low back pain, unspecified: Secondary | ICD-10-CM

## 2011-03-11 DIAGNOSIS — I10 Essential (primary) hypertension: Secondary | ICD-10-CM

## 2011-03-11 DIAGNOSIS — M549 Dorsalgia, unspecified: Secondary | ICD-10-CM

## 2011-03-11 LAB — POCT URINALYSIS DIPSTICK
Glucose, UA: NEGATIVE
Nitrite, UA: NEGATIVE
pH, UA: 5.5

## 2011-03-11 MED ORDER — HYDROCODONE-ACETAMINOPHEN 5-325 MG PO TABS: 1.0000 | ORAL_TABLET | Freq: Four times a day (QID) | ORAL | Status: AC | PRN

## 2011-03-11 NOTE — Patient Instructions (Signed)
This acts like mechanical back pain and not related to  Infection .  pain medicine as needed.  Most back pain improves in 1-2 weeks and if not happening then call and weill get dr Ophelia Charter to see you.   Moist heat is ok . Precaution to avoid falling.

## 2011-03-11 NOTE — Progress Notes (Signed)
  Subjective:    Patient ID: Nina Le, female    DOB: [birth year], 70 y.o.   MRN: [id number]  HPI Patient comes in today for SDA  For acute problem evaluation. She is with her granddaughter who drove her here.  Onset  A few days ago when standing  Near sink  Something suddnely began hurting  So  went back to bed to help  can walk  All right but getting up from chair is dfficult.  Pain located right lower back without radiation. No fever . No falling and no trauma noted .  Had a cold last month.   Was coughing some and now  Back hurts with cough.  No shooting pains lown legs.  Took 3  Tylenol twice  For 2 days and heating pad mild help.  shower helps some.  Some increase frequency urination but no dysuria or uti sx otherwise .    Review of Systems No co sib  Co back pain/ no falling  No fever sweats bowel changes bleeding .No falliung Remote hx of back surgery Dr Ophelia Charter ? 2004 or so .  At that time had radiation pain. No cp sob bleeding .   Past history family history social history reviewed in the electronic medical record. Lives with daughter and has help at home .     Objective:   Physical Exam  WDWN in mild discomfort at rest .  here with granddaughter .  In mild distress  Can walk pain with arising from sitting walks gingerly but no foot drop toe heel walk some pain right heel walk.  No mid line tenderness flank pain   Bruising  Area of conern is about right si area  Chest:  Clear to A&P without wheezes rales or rhonchi CV:  S1-S2 no gallops or murmurs peripheral perfusion is normal Neck: Supple without adenopathy or masses or bruits Skin: normal capillary refill ,turgor , color: No acute rashes ,petechiae or bruising  UA negative    Assessment & Plan:  Acute low back pain. No alarm findings but hx of back surgery 2005   It does hurt to deep breath but  in right ls si area . Seems mechanical  Disc options of management.  Want to avoid NSAID s for her if possible  Comfort care  and time  And plan refer to Dr Ophelia Charter if not improving over next 2 weeks or if worse. Pain meds for now and activity as tolerated.    Risk benefit of medication discussed.  HT stable at present Obesity  Contributing.  Total visit > 50% spent counseling and coordinating care  And record review with pat here

## 2011-03-12 ENCOUNTER — Encounter: Payer: Self-pay | Admitting: Internal Medicine

## 2011-03-12 DIAGNOSIS — M549 Dorsalgia, unspecified: Secondary | ICD-10-CM

## 2011-03-12 HISTORY — DX: Dorsalgia, unspecified: M54.9

## 2011-03-24 ENCOUNTER — Other Ambulatory Visit: Payer: Self-pay | Admitting: Internal Medicine

## 2011-03-24 DIAGNOSIS — R921 Mammographic calcification found on diagnostic imaging of breast: Secondary | ICD-10-CM

## 2011-04-11 ENCOUNTER — Ambulatory Visit
Admission: RE | Admit: 2011-04-11 | Discharge: 2011-04-11 | Disposition: A | Discharge status: Home / Self Care | Payer: Medicare Other | Source: Ambulatory Visit | Attending: Internal Medicine | Admitting: Internal Medicine

## 2011-04-11 ENCOUNTER — Other Ambulatory Visit: Payer: Self-pay | Admitting: Internal Medicine

## 2011-04-11 DIAGNOSIS — R921 Mammographic calcification found on diagnostic imaging of breast: Secondary | ICD-10-CM

## 2011-05-27 ENCOUNTER — Other Ambulatory Visit: Payer: Self-pay | Admitting: Internal Medicine

## 2011-05-30 ENCOUNTER — Other Ambulatory Visit: Payer: Self-pay | Admitting: Internal Medicine

## 2011-07-08 ENCOUNTER — Other Ambulatory Visit: Payer: Self-pay | Admitting: Internal Medicine

## 2011-10-25 ENCOUNTER — Ambulatory Visit (INDEPENDENT_AMBULATORY_CARE_PROVIDER_SITE_OTHER): Payer: Medicare Other | Admitting: Internal Medicine

## 2011-10-25 ENCOUNTER — Encounter: Payer: Self-pay | Admitting: Internal Medicine

## 2011-10-25 ENCOUNTER — Telehealth: Payer: Self-pay | Admitting: Internal Medicine

## 2011-10-25 VITALS — BP 122/78 | HR 91 | Temp 98.4°F | Wt 260.0 lb

## 2011-10-25 DIAGNOSIS — Z87898 Personal history of other specified conditions: Secondary | ICD-10-CM

## 2011-10-25 DIAGNOSIS — E669 Obesity, unspecified: Secondary | ICD-10-CM

## 2011-10-25 DIAGNOSIS — M25569 Pain in unspecified knee: Secondary | ICD-10-CM

## 2011-10-25 DIAGNOSIS — E785 Hyperlipidemia, unspecified: Secondary | ICD-10-CM

## 2011-10-25 DIAGNOSIS — M25561 Pain in right knee: Secondary | ICD-10-CM

## 2011-10-25 DIAGNOSIS — I10 Essential (primary) hypertension: Secondary | ICD-10-CM

## 2011-10-25 DIAGNOSIS — Z9189 Other specified personal risk factors, not elsewhere classified: Secondary | ICD-10-CM

## 2011-10-25 LAB — LIPID PANEL
Cholesterol: 190 mg/dL (ref 0–200)
HDL: 49.1 mg/dL (ref >39.00)
Triglycerides: 50 mg/dL (ref 0.0–149.0)
VLDL: 10 mg/dL (ref 0.0–40.0)

## 2011-10-25 LAB — HEPATIC FUNCTION PANEL
Albumin: 3.5 g/dL (ref 3.5–5.2)
Alkaline Phosphatase: 90 U/L (ref 39–117)
Total Protein: 7.1 g/dL (ref 6.0–8.3)

## 2011-10-25 LAB — CBC WITH DIFFERENTIAL/PLATELET
Basophils Absolute: 0 10*3/uL (ref 0.0–0.1)
Basophils Relative: 0.3 % (ref 0.0–3.0)
Eosinophils Absolute: 0.1 10*3/uL (ref 0.0–0.7)
Hemoglobin: 12.7 g/dL (ref 12.0–15.0)
Lymphocytes Relative: 29.4 % (ref 12.0–46.0)
MCHC: 32.3 g/dL (ref 30.0–36.0)
MCV: 85.3 fl (ref 78.0–100.0)
Monocytes Absolute: 0.5 10*3/uL (ref 0.1–1.0)
Neutro Abs: 4 10*3/uL (ref 1.4–7.7)
RBC: 4.59 Mil/uL (ref 3.87–5.11)
RDW: 14.1 % (ref 11.5–14.6)

## 2011-10-25 LAB — BASIC METABOLIC PANEL
CO2: 23 mEq/L (ref 19–32)
Calcium: 9 mg/dL (ref 8.4–10.5)
Chloride: 108 mEq/L (ref 96–112)
Creatinine, Ser: 0.7 mg/dL (ref 0.4–1.2)
Glucose, Bld: 105 mg/dL — ABNORMAL HIGH (ref 70–99)

## 2011-10-25 MED ORDER — AMLODIPINE BESYLATE 10 MG PO TABS: 10.0000 mg | ORAL_TABLET | Freq: Every day | ORAL | Status: DC

## 2011-10-25 MED ORDER — LISINOPRIL-HYDROCHLOROTHIAZIDE 20-12.5 MG PO TABS: 1.0000 | ORAL_TABLET | Freq: Every day | ORAL | Status: DC

## 2011-10-25 NOTE — Telephone Encounter (Signed)
Pt states she went to [facility] to pick up meds and it will cost over $100. Pt requesting to have them resent to [facility], amLODipine (NORVASC) 10 MG tablet,lisinopril-hydrochlorothiazide (PRINZIDE,ZESTORETIC) 20-12.5 MG per tablet    [employer]

## 2011-10-25 NOTE — Patient Instructions (Signed)
Will notify you  of labs when available. Continue bp meds. Look into comfortable knee support  And then call of fax request and see what we can do  About this.   Some type of knee exercises are helpful to keep the knee pain stable. Knee Exercises EXERCISES RANGE OF MOTION(ROM) AND STRETCHING EXERCISES These exercises may help you when beginning to rehabilitate your injury. Your symptoms may resolve with or without further involvement from your physician, physical therapist or athletic trainer. While completing these exercises, remember:   Restoring tissue flexibility helps normal motion to return to the joints. This allows healthier, less painful movement and activity.   An effective stretch should be held for at least 30 seconds.   A stretch should never be painful. You should only feel a gentle lengthening or release in the stretched tissue.  STRETCH - Knee Extension, Prone  Lie on your stomach on a firm surface, such as a bed or countertop. Place your right / left knee and leg just beyond the edge of the surface. You may wish to place a towel under the far end of your right / left thigh for comfort.   Relax your leg muscles and allow gravity to straighten your knee. Your clinician may advise you to add an ankle weight if more resistance is helpful for you.   You should feel a stretch in the back of your right / left knee. Hold this position for __________ seconds.  Repeat __________ times. Complete this stretch __________ times per day. * Your physician, physical therapist or athletic trainer may ask you to add ankle weight to enhance your stretch.  RANGE OF MOTION - Knee Flexion, Active  Lie on your back with both knees straight. (If this causes back discomfort, bend your opposite knee, placing your foot flat on the floor.)   Slowly slide your heel back toward your buttocks until you feel a gentle stretch in the front of your knee or thigh.   Hold for __________ seconds. Slowly  slide your heel back to the starting position.  Repeat __________ times. Complete this exercise __________ times per day.  STRETCH - Quadriceps, Prone   Lie on your stomach on a firm surface, such as a bed or padded floor.   Bend your right / left knee and grasp your ankle. If you are unable to reach, your ankle or pant leg, use a belt around your foot to lengthen your reach.   Gently pull your heel toward your buttocks. Your knee should not slide out to the side. You should feel a stretch in the front of your thigh and/or knee.   Hold this position for __________ seconds.  Repeat __________ times. Complete this stretch __________ times per day.  STRETCH - Hamstrings, Supine   Lie on your back. Loop a belt or towel over the ball of your right / left foot.   Straighten your right / left knee and slowly pull on the belt to raise your leg. Do not allow the right / left knee to bend. Keep your opposite leg flat on the floor.   Raise the leg until you feel a gentle stretch behind your right / left knee or thigh. Hold this position for __________ seconds.  Repeat __________ times. Complete this stretch __________ times per day.  STRENGTHENING EXERCISES These exercises may help you when beginning to rehabilitate your injury. They may resolve your symptoms with or without further involvement from your physician, physical therapist or athletic trainer. While  completing these exercises, remember:   Muscles can gain both the endurance and the strength needed for everyday activities through controlled exercises.   Complete these exercises as instructed by your physician, physical therapist or athletic trainer. Progress the resistance and repetitions only as guided.   You may experience muscle soreness or fatigue, but the pain or discomfort you are trying to eliminate should never worsen during these exercises. If this pain does worsen, stop and make certain you are following the directions exactly. If  the pain is still present after adjustments, discontinue the exercise until you can discuss the trouble with your clinician.  STRENGTH - Quadriceps, Isometrics  Lie on your back with your right / left leg extended and your opposite knee bent.   Gradually tense the muscles in the front of your right / left thigh. You should see either your knee cap slide up toward your hip or increased dimpling just above the knee. This motion will push the back of the knee down toward the floor/mat/bed on which you are lying.   Hold the muscle as tight as you can without increasing your pain for __________ seconds.   Relax the muscles slowly and completely in between each repetition.  Repeat __________ times. Complete this exercise __________ times per day.  STRENGTH - Quadriceps, Short Arcs   Lie on your back. Place a __________ inch towel roll under your knee so that the knee slightly bends.   Raise only your lower leg by tightening the muscles in the front of your thigh. Do not allow your thigh to rise.   Hold this position for __________ seconds.  Repeat __________ times. Complete this exercise __________ times per day.  OPTIONAL ANKLE WEIGHTS: Begin with ____________________, but DO NOT exceed ____________________. Increase in 1 pound/0.5 kilogram increments.  STRENGTH - Quadriceps, Straight Leg Raises  Quality counts! Watch for signs that the quadriceps muscle is working to insure you are strengthening the correct muscles and not "cheating" by substituting with healthier muscles.  Lay on your back with your right / left leg extended and your opposite knee bent.   Tense the muscles in the front of your right / left thigh. You should see either your knee cap slide up or increased dimpling just above the knee. Your thigh may even quiver.   Tighten these muscles even more and raise your leg 4 to 6 inches off the floor. Hold for __________ seconds.   Keeping these muscles tense, lower your leg.    Relax the muscles slowly and completely in between each repetition.  Repeat __________ times. Complete this exercise __________ times per day.  STRENGTH - Hamstring, Curls  Lay on your stomach with your legs extended. (If you lay on a bed, your feet may hang over the edge.)   Tighten the muscles in the back of your thigh to bend your right / left knee up to 90 degrees. Keep your hips flat on the bed/floor.   Hold this position for __________ seconds.   Slowly lower your leg back to the starting position.  Repeat __________ times. Complete this exercise __________ times per day.  OPTIONAL ANKLE WEIGHTS: Begin with ____________________, but DO NOT exceed ____________________. Increase in 1 pound/0.5 kilogram increments.  STRENGTH - Quadriceps, Squats  Stand in a door frame so that your feet and knees are in line with the frame.   Use your hands for balance, not support, on the frame.   Slowly lower your weight, bending at the hips and  knees. Keep your lower legs upright so that they are parallel with the door frame. Squat only within the range that does not increase your knee pain. Never let your hips drop below your knees.   Slowly return upright, pushing with your legs, not pulling with your hands.  Repeat __________ times. Complete this exercise __________ times per day.  STRENGTH - Quadriceps, Wall Slides  Follow guidelines for form closely. Increased knee pain often results from poorly placed feet or knees.  Lean against a smooth wall or door and walk your feet out 18-24 inches. Place your feet hip-width apart.   Slowly slide down the wall or door until your knees bend __________ degrees.* Keep your knees over your heels, not your toes, and in line with your hips, not falling to either side.   Hold for __________ seconds. Stand up to rest for __________ seconds in between each repetition.  Repeat __________ times. Complete this exercise __________ times per day. * Your  physician, physical therapist or athletic trainer will alter this angle based on your symptoms and progress. Document Released: 02/09/2005 Document Revised: 03/17/2011 Document Reviewed: 07/10/2008 [facility] Patient Information 2012 [place], [place].

## 2011-10-25 NOTE — Telephone Encounter (Signed)
Sent to [employer] [facility].

## 2011-10-25 NOTE — Progress Notes (Signed)
  Subjective:    Patient ID: Nina Le, female    DOB: [birth year], 71 y.o.   MRN: [id number]  HPI Patient comes in today for follow up of  multiple medical problems. Last visit was over a year ago HT  Taking meds  Not checking readings and  Can tell when pressure up .  No labs since 2011. Denies any side effects of medication. Has a history of an abnormal mammogram that is recommended to follow in 6 months asks about getting arranged through the breasts are Right knee is problematic having pain off and on and affects  going up steps   And causes pain asks about a band  Trying otc and doesn't fit. Because her thighs large sometimes her knee swells is not totally debilitating has no history of a fall apparently has a remote history of seeing an orthopedist and having the injections in the past. hh of 2 no pets  Review of Systems Neg cp sob  fell ankle swelling .  No fever bleeding change in bowel habits. Needs refill medicines.  No bleeding  Cough  Past history family history social history reviewed in the electronic medical record. Outpatient Encounter Prescriptions as of 10/25/2011  Medication Sig Dispense Refill  . Multiple Vitamin (MULTIVITAMIN) tablet Take 1 tablet by mouth daily.        Marland Kitchen DISCONTD: amLODipine (NORVASC) 10 MG tablet TAKE 1 TABLET BY MOUTH EVERY DAY  30 tablet  0  . DISCONTD: amLODipine (NORVASC) 10 MG tablet Take 1 tablet (10 mg total) by mouth daily.  90 tablet  3  . DISCONTD: lisinopril-hydrochlorothiazide (PRINZIDE,ZESTORETIC) 20-12.5 MG per tablet TAKE 1 TABLET BY MOUTH ONCE A DAY  90 tablet  0  . DISCONTD: lisinopril-hydrochlorothiazide (PRINZIDE,ZESTORETIC) 20-12.5 MG per tablet Take 1 tablet by mouth daily.  90 tablet  3  . DISCONTD: guaiFENesin (MUCINEX) 600 MG 12 hr tablet Take 600 mg by mouth 2 (two) times daily.              Objective:   Physical Exam BP 122/78  Pulse 91  Temp 98.4 F (36.9 C) (Oral)  Wt 260 lb (117.935 kg)  SpO2 98% WDWN   Neck:  Supple without adenopathy or masses or bruits Chest:  Clear to A&P without wheezes rales or rhonchi CV:  S1-S2 no gallops or murmurs peripheral perfusion is normal no m heard today  Abdomen:  Soft. normal bowel sounds without hepatosplenomegaly, no guarding rebound or masses no CVA tenderness.  No clubbing cyanosis or edema Right knee  Mild swelling.  Good rom no crepitus.  Stable  Neg drawer.       Assessment & Plan:  Hypertension seems controlled  Knee pain  Poss DJD  Options discussed pt etc  Bracing may not help that much ok to  See med supply .  Exercise as needed  Lose weight . Back disease stable at this time hx of back surgery.  Get  fu mammogram  Plan fu depending on labs   Consider seeing ortho if  persistent or progressive    Total visit > 50% spent counseling and coordinating care

## 2011-10-26 ENCOUNTER — Other Ambulatory Visit: Payer: Self-pay | Admitting: Family Medicine

## 2011-10-26 MED ORDER — LISINOPRIL-HYDROCHLOROTHIAZIDE 20-12.5 MG PO TABS: 1.0000 | ORAL_TABLET | Freq: Every day | ORAL | Status: DC

## 2011-10-28 ENCOUNTER — Ambulatory Visit
Admission: RE | Admit: 2011-10-28 | Discharge: 2011-10-28 | Disposition: A | Discharge status: Home / Self Care | Payer: Medicare Other | Source: Ambulatory Visit | Attending: Internal Medicine | Admitting: Internal Medicine

## 2011-10-28 DIAGNOSIS — Z87898 Personal history of other specified conditions: Secondary | ICD-10-CM

## 2012-10-01 ENCOUNTER — Encounter: Payer: Self-pay | Admitting: Internal Medicine

## 2012-10-01 ENCOUNTER — Ambulatory Visit (INDEPENDENT_AMBULATORY_CARE_PROVIDER_SITE_OTHER): Payer: Medicare Other | Admitting: Internal Medicine

## 2012-10-01 ENCOUNTER — Other Ambulatory Visit: Payer: Self-pay | Admitting: Family Medicine

## 2012-10-01 VITALS — BP 120/80 | HR 77 | Temp 98.1°F | Wt 259.0 lb

## 2012-10-01 DIAGNOSIS — M25569 Pain in unspecified knee: Secondary | ICD-10-CM

## 2012-10-01 DIAGNOSIS — M25562 Pain in left knee: Secondary | ICD-10-CM

## 2012-10-01 NOTE — Patient Instructions (Signed)
Dr Darrelyn Hillock  appt    [facility] ortho .  Today .

## 2012-10-01 NOTE — Progress Notes (Signed)
Chief Complaint  Patient presents with  . Knee Pain    Left side behind the knee.  Started Sunday morning.    HPI: Patient comes in today for SDA for  new problem evaluation. Last ov was  Almost a year ago . Onset  1 day ago  3 am . Micah Flesher to [facility] and something popped  In left knee  Posterior and almost fell  And limped from  the bathroom . Took some tylenol. And layed down   ; got up at 7 30 some better  .  Pain 9/10  Now 7/10 hurts to extend and walk no falling ? If unstable  Has a driver .   [place] .  No hx of same    Denies  Chronic knee problems  Etc .   ROS: See pertinent positives and negatives per HPI.  Past Medical History  Diagnosis Date  . Low back pain   . Diastolic dysfunction     Mild Echo  . Alkaline phosphatase elevation     hx-neg lfts  . Hyperlipidemia   . Hypertension     Family History  Problem Relation Age of Onset  . Breast cancer Sister   . Arthritis Mother   . Hypertension Mother   . Breast cancer Mother     cancer in the blood  . Heart disease Father     History   Social History  . Marital Status: Married    Spouse Name: N/A    Number of Children: N/A  . Years of Education: N/A   Social History Main Topics  . Smoking status: Never Smoker   . Smokeless tobacco: None  . Alcohol Use: No  . Drug Use: No  . Sexually Active: None   Other Topics Concern  . 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 amLODipine (NORVASC) 10 MG tablet Take 1 tablet (10 mg total) by mouth daily.  90 tablet  3  . lisinopril-hydrochlorothiazide (PRINZIDE,ZESTORETIC) 20-12.5 MG per tablet Take 1 tablet by mouth daily.  90 tablet  3  . Multiple Vitamin (MULTIVITAMIN) tablet Take 1 tablet by mouth daily.         No facility-administered  encounter medications on file as of 10/01/2012.    EXAM:  BP 120/80  Pulse 77  Temp(Src) 98.1 F (36.7 C) (Oral)  Wt 259 lb (117.482 kg)  BMI 42.43 kg/m2  SpO2 99%  Body mass index is 42.43 kg/(m^2).  GENERAL: vitals reviewed and listed above, alert, oriented, appears well hydrated and in no acute distress mod pain with walking .  Left knee no red or warmth  Posterior pain  Tender    No anterior  Pain  Crepitus cannot do drawer adequate;ly limping gait . PSYCH: pleasant and cooperative, no obvious depression or anxiety  ASSESSMENT AND PLAN:  Discussed the following assessment and plan:  Acute knee pain, left - after pop posterior pain.  [place]   Ortho consult  Would be best today .  Or tomorrow  No x ray done as referring  Cannot assess stability of knee  At this time  -Patient advised to return or notify health care team  if symptoms worsen or persist or new concerns arise.  Patient Instructions  Dr Darrelyn Hillock  appt    [facility] ortho .  Today .      [name]. Panosh M.D.

## 2012-10-10 ENCOUNTER — Ambulatory Visit (INDEPENDENT_AMBULATORY_CARE_PROVIDER_SITE_OTHER): Payer: Medicare Other | Admitting: Family

## 2012-10-10 ENCOUNTER — Encounter: Payer: Self-pay | Admitting: Family

## 2012-10-10 ENCOUNTER — Telehealth: Payer: Self-pay | Admitting: Internal Medicine

## 2012-10-10 VITALS — BP 122/78 | HR 93 | Temp 98.8°F | Wt 259.0 lb

## 2012-10-10 DIAGNOSIS — J209 Acute bronchitis, unspecified: Secondary | ICD-10-CM

## 2012-10-10 DIAGNOSIS — J069 Acute upper respiratory infection, unspecified: Secondary | ICD-10-CM

## 2012-10-10 MED ORDER — HYDROCOD POLST-CHLORPHEN POLST 10-8 MG/5ML PO LQCR: 5.0000 mL | Freq: Two times a day (BID) | ORAL | Status: DC | PRN

## 2012-10-10 MED ORDER — METHYLPREDNISOLONE 4 MG PO KIT: PACK | ORAL | Status: AC

## 2012-10-10 NOTE — Progress Notes (Signed)
Subjective:    Patient ID: Nina Le, female    DOB: 02/09/1941, 72 y.o.   MRN: [id number]  HPI 72 year old AAF, nonsmoker, patient of Dr. Fabian Sharp is in today with c/o cough, congestion, wheezing, sneezing x 1.5 weeks. She has been taking Coricidin with no relief. Symptoms no better.   Review of Systems  Constitutional: Negative.   Respiratory: Positive for cough and wheezing.   Cardiovascular: Negative.   Musculoskeletal: Negative.   Skin: Negative.   Allergic/Immunologic: Negative.   Neurological: Negative.   Psychiatric/Behavioral: Negative.    Past Medical History  Diagnosis Date  . Low back pain   . Diastolic dysfunction     Mild Echo  . Alkaline phosphatase elevation     hx-neg lfts  . Hyperlipidemia   . Hypertension     History   Social History  . Marital Status: Married    Spouse Name: N/A    Number of Children: N/A  . Years of Education: N/A   Occupational History  . Not on file.   Social History Main Topics  . Smoking status: Never Smoker   . Smokeless tobacco: Not on file  . Alcohol Use: No  . Drug Use: No  . Sexually Active: Not on file   Other Topics Concern  . 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 US echocardiography      mild mitral regurgitation  . Knee surgery      Rt  . Cardiac catheterization      nl coronary 2006   . Spine surgery  2006    Dr. Ophelia Charter    Family History  Problem Relation Age of Onset  . Breast cancer Sister   . Arthritis Mother   . Hypertension Mother   . Breast cancer Mother     cancer in the blood  . Heart disease Father     No Known Allergies  Current Outpatient Prescriptions on File Prior to Visit  Medication Sig Dispense Refill  . amLODipine (NORVASC) 10 MG tablet Take 1 tablet (10 mg total) by mouth  daily.  90 tablet  3  . lisinopril-hydrochlorothiazide (PRINZIDE,ZESTORETIC) 20-12.5 MG per tablet Take 1 tablet by mouth daily.  90 tablet  3  . Multiple Vitamin (MULTIVITAMIN) tablet Take 1 tablet by mouth daily.         No current facility-administered medications on file prior to visit.    BP 122/78  Pulse 93  Temp(Src) 98.8 F (37.1 C) (Oral)  Wt 259 lb (117.482 kg)  BMI 42.43 kg/m2  SpO2 98%chart    Objective:   Physical Exam  Constitutional: She is oriented to person, place, and time. She appears well-developed and well-nourished.  Neck: Normal range of motion. Neck supple.  Cardiovascular: Normal rate, regular rhythm and normal heart sounds.   Pulmonary/Chest: Effort normal. She has wheezes.  Mild expiratory wheezing.   Abdominal: Soft. Bowel sounds are normal.  Neurological: She is alert and oriented to person, place, and time. She has normal reflexes. She displays normal reflexes. She exhibits normal muscle tone. Coordination normal.  Skin: Skin is warm and dry.  Psychiatric: She has a normal mood and affect.  Assessment & Plan:  Assessment:  1. Acute Bronchitis 2. URI  Plan: Medrol dosepak as directed. Tussionex 1 tsp twice a day as needed. Call the office if symptoms worsen or persist. Recheck as scheduled ans as needed.

## 2012-10-10 NOTE — Patient Instructions (Signed)

## 2012-10-10 NOTE — Telephone Encounter (Signed)
Patient Information:  Caller Name: Nina  Phone: [phone]  Patient: Nina Le, Nina Le  Gender: Female  DOB: 08/31/1940  Age: 72 Years  PCP: Berniece Andreas (Family Practice)  Office Follow Up:  Does the office need to follow up with this patient?: No  Instructions For The Office: N/Le   Symptoms  Reason For Call & Symptoms: Pt has sinus pain and congestion and Le cough x 1 week. Pt has been taking Cloricidan and cough drops with no relief. Pt is calling for an antibiotic. RN triaged per cough/pt up coughing all night with mild wheeze.  Reviewed Health History In EMR: Yes  Reviewed Medications In EMR: Yes  Reviewed Allergies In EMR: Yes  Reviewed Surgeries / Procedures: Yes  Date of Onset of Symptoms: 10/03/2012  Guideline(s) Used:  Cough  Disposition Per Guideline:   See Today in Office  Reason For Disposition Reached:   Severe coughing spells (e.g., whooping sound after coughing, vomiting after coughing)  Advice Given:  N/Le  Patient Will Follow Care Advice:  YES  Appointment Scheduled:  10/10/2012 11:15:00 Appointment Scheduled Provider:  Adline Mango [facility])

## 2012-12-14 ENCOUNTER — Other Ambulatory Visit: Payer: Self-pay

## 2012-12-14 DIAGNOSIS — Z1231 Encounter for screening mammogram for malignant neoplasm of breast: Secondary | ICD-10-CM

## 2012-12-17 ENCOUNTER — Other Ambulatory Visit: Payer: Self-pay | Admitting: Internal Medicine

## 2012-12-17 DIAGNOSIS — I10 Essential (primary) hypertension: Secondary | ICD-10-CM

## 2012-12-18 ENCOUNTER — Telehealth: Payer: Self-pay | Admitting: Family Medicine

## 2012-12-18 NOTE — Telephone Encounter (Signed)
Patient needs lab work and a follow up appt with [facility].  Lab orders placed in the system.  Please make both appointments with the pt.  Thanks!!

## 2012-12-19 ENCOUNTER — Telehealth: Payer: Self-pay | Admitting: Internal Medicine

## 2012-12-19 NOTE — Telephone Encounter (Signed)
Called and spoke to the pharmacy technician [facility]) at [facility].  She informed me the prescriptions are ready for pick up.  Informed the pt who told me the automated service informed her that they were still waiting to be received.  She will pick them up today.

## 2012-12-19 NOTE — Telephone Encounter (Signed)
PT called and stated that the pharmacy does not have record of her amLODipine (NORVASC) 10 MG tablet, and lisinopril-hydrochlorothiazide (PRINZIDE,ZESTORETIC) 20-12.5 MG per tablet prescriptions. Pt is completely out of her medication. Please assist.

## 2012-12-31 ENCOUNTER — Ambulatory Visit (INDEPENDENT_AMBULATORY_CARE_PROVIDER_SITE_OTHER): Payer: Medicare Other | Admitting: Family Medicine

## 2012-12-31 ENCOUNTER — Encounter: Payer: Self-pay | Admitting: Family Medicine

## 2012-12-31 VITALS — BP 120/82 | HR 106 | Temp 98.3°F | Wt 262.0 lb

## 2012-12-31 DIAGNOSIS — J069 Acute upper respiratory infection, unspecified: Secondary | ICD-10-CM

## 2012-12-31 MED ORDER — HYDROCOD POLST-CHLORPHEN POLST 10-8 MG/5ML PO LQCR: 5.0000 mL | Freq: Every evening | ORAL | Status: DC | PRN

## 2012-12-31 NOTE — Progress Notes (Addendum)
Chief Complaint  Patient presents with  . Cough    sore throat, wheezing     HPI:  Acute visit for sore throat: -started 1 week ago -reports: has had clear nasal congestion, chills initially - resolved, sore throat, drainage in throat, cough, wheezing initially - resolved,  -denies: SOB, NVD, fever -tried OTC cough medications -wants cough medication from last year  ROS: See pertinent positives and negatives per HPI.  Past Medical History  Diagnosis Date  . Low back pain   . Diastolic dysfunction     Mild Echo  . Alkaline phosphatase elevation     hx-neg lfts  . Hyperlipidemia   . Hypertension     Past Surgical History  Procedure Laterality Date  . US echocardiography      mild mitral regurgitation  . Knee surgery      Rt  . Cardiac catheterization      nl coronary 2006   . Spine surgery  2006    Dr. Ophelia Charter    Family History  Problem Relation Age of Onset  . Breast cancer Sister   . Arthritis Mother   . Hypertension Mother   . Breast cancer Mother     cancer in the blood  . Heart disease Father     History   Social History  . Marital Status: Married    Spouse Name: N/A    Number of Children: N/A  . Years of Education: N/A   Social History Main Topics  . Smoking status: Never Smoker   . Smokeless tobacco: None  . Alcohol Use: No  . Drug Use: No  . Sexual Activity: None   Other Topics Concern  . 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  , Disp: , Rfl:   chlorpheniramine-HYDROcodone (TUSSIONEX PENNKINETIC ER) 10-8 MG/5ML LQCR, Take 5 mLs by mouth at bedtime as needed., Disp: 140 mL, Rfl: 0  EXAM:  Filed Vitals:   12/31/12 1017  BP: 120/82  Pulse: 106  Temp: 98.3 F (36.8 C)    Body mass index is 42.92 kg/(m^2).  GENERAL: vitals reviewed and listed above, alert, oriented, appears well hydrated and in no acute distress  HEENT: atraumatic, conjunttiva clear, no obvious abnormalities on inspection of external nose and ears, normal appearance of ear canals and TMs, clear nasal congestion, mild post oropharyngeal erythema with PND, no tonsillar edema or exudate, no sinus TTP  NECK: no obvious masses on inspection  LUNGS: clear to auscultation bilaterally, no wheezes, rales or rhonchi, good air movement  CV: HRRR, no peripheral edema  MS: moves all extremities without noticeable abnormality  PSYCH: pleasant and cooperative, no obvious depression or anxiety  ASSESSMENT AND PLAN:  Discussed the following assessment and plan:  Upper respiratory infection - Plan: chlorpheniramine-HYDROcodone (TUSSIONEX PENNKINETIC ER) 10-8 MG/5ML LQCR  -exam and hx c/w with VURI, lungs clear -supportive care and return precuations -discussed risks with cough medication and OTC products -Patient advised to return or notify  a doctor immediately if symptoms worsen or persist or new concerns arise.  Patient Instructions  INSTRUCTIONS FOR UPPER RESPIRATORY INFECTION:  -plenty of rest and   -As we discussed, we have prescribed a new medication for you at this appointment. We discussed the common and serious potential adverse effects of this medication and you can review these and more with the pharmacist when you pick up your medication.  Please follow the instructions for use carefully and notify us immediately if you have any problems taking this medication.  -nasal saline wash 2-3 times daily (use prepackaged nasal saline or bottled/distilled water  if making your own)   -clean nose with nasal saline before using the nasal steroid or sinex  -can use sinex or AFRIN nasal spray for drainage and nasal congestion - but do NOT use longer then 3-4 days  -can use tylenol or ibuprofen as directed for aches and sorethroat  -if you are taking a cough medication - use only as directed, may also try a teaspoon of honey to coat the throat and throat lozenges  -for sore throat, salt water gargles can help  -follow up if you have fevers, facial pain, tooth pain, difficulty breathing or are worsening or not getting better in 5-7 days      Nina Le R.

## 2012-12-31 NOTE — Patient Instructions (Signed)
INSTRUCTIONS FOR UPPER RESPIRATORY INFECTION:  -plenty of rest and   -As we discussed, we have prescribed a new medication for you at this appointment. We discussed the common and serious potential adverse effects of this medication and you can review these and more with the pharmacist when you pick up your medication.  Please follow the instructions for use carefully and notify us immediately if you have any problems taking this medication.  -nasal saline wash 2-3 times daily (use prepackaged nasal saline or bottled/distilled water if making your own)   -clean nose with nasal saline before using the nasal steroid or sinex  -can use sinex or AFRIN nasal spray for drainage and nasal congestion - but do NOT use longer then 3-4 days  -can use tylenol or ibuprofen as directed for aches and sorethroat  -if you are taking a cough medication - use only as directed, may also try a teaspoon of honey to coat the throat and throat lozenges  -for sore throat, salt water gargles can help  -follow up if you have fevers, facial pain, tooth pain, difficulty breathing or are worsening or not getting better in 5-7 days

## 2013-01-02 ENCOUNTER — Ambulatory Visit
Admission: RE | Admit: 2013-01-02 | Discharge: 2013-01-02 | Disposition: A | Discharge status: Home / Self Care | Payer: Medicare Other | Source: Ambulatory Visit

## 2013-01-02 DIAGNOSIS — Z1231 Encounter for screening mammogram for malignant neoplasm of breast: Secondary | ICD-10-CM

## 2013-01-09 ENCOUNTER — Ambulatory Visit (INDEPENDENT_AMBULATORY_CARE_PROVIDER_SITE_OTHER): Payer: Medicare Other | Admitting: Internal Medicine

## 2013-01-09 ENCOUNTER — Other Ambulatory Visit: Payer: Medicare Other

## 2013-01-09 ENCOUNTER — Encounter: Payer: Self-pay | Admitting: Internal Medicine

## 2013-01-09 VITALS — BP 104/70 | HR 87 | Temp 97.8°F | Wt 257.0 lb

## 2013-01-09 DIAGNOSIS — E785 Hyperlipidemia, unspecified: Secondary | ICD-10-CM

## 2013-01-09 DIAGNOSIS — R5381 Other malaise: Secondary | ICD-10-CM

## 2013-01-09 DIAGNOSIS — G479 Sleep disorder, unspecified: Secondary | ICD-10-CM

## 2013-01-09 DIAGNOSIS — I10 Essential (primary) hypertension: Secondary | ICD-10-CM

## 2013-01-09 DIAGNOSIS — Z23 Encounter for immunization: Secondary | ICD-10-CM

## 2013-01-09 DIAGNOSIS — T50905A Adverse effect of unspecified drugs, medicaments and biological substances, initial encounter: Secondary | ICD-10-CM

## 2013-01-09 DIAGNOSIS — T887XXA Unspecified adverse effect of drug or medicament, initial encounter: Secondary | ICD-10-CM

## 2013-01-09 LAB — POCT URINALYSIS DIP (MANUAL ENTRY)
Bilirubin, UA: NEGATIVE
Glucose, UA: NEGATIVE
Nitrite, UA: NEGATIVE
Protein Ur, POC: NEGATIVE
Spec Grav, UA: 1.02
Urobilinogen, UA: 0.2

## 2013-01-09 LAB — CBC WITH DIFFERENTIAL/PLATELET
Basophils Relative: 0.3 % (ref 0.0–3.0)
Eosinophils Absolute: 0 10*3/uL (ref 0.0–0.7)
Lymphocytes Relative: 23.6 % (ref 12.0–46.0)
MCHC: 32.5 g/dL (ref 30.0–36.0)
MCV: 83.9 fl (ref 78.0–100.0)
Monocytes Absolute: 0.8 10*3/uL (ref 0.1–1.0)
Neutrophils Relative %: 67.6 % (ref 43.0–77.0)
Platelets: 311 10*3/uL (ref 150.0–400.0)
RBC: 5.04 Mil/uL (ref 3.87–5.11)
WBC: 10.1 10*3/uL (ref 4.5–10.5)

## 2013-01-09 LAB — HEMOGLOBIN A1C: Hgb A1c MFr Bld: 6.3 % (ref 4.6–6.5)

## 2013-01-09 NOTE — Patient Instructions (Signed)
irt is possible that the cough medicine was too strong for the way taken.  Labs today to make sure no other cause of sx.  Check Bp readings  The next few week . s   Send [place] readings if too low may need to decrease medication.  Contact us about how you are doing in the week   Ok to take centrum silver .

## 2013-01-09 NOTE — Progress Notes (Signed)
Chief Complaint  Patient presents with  . Cough    Has stopped taking the cough medication.  It is upsetting her stomach.  Her fatigue continues and is not getting better.  She feels consistently tired throughout the day.  . Fatigue    HPI:  Patient comes in today with daughter for acute ST a work in because she is here for her blood tests. Was seen a couple weeks ago for viral respiratory infection and cough given symptomatic treatment which included Tussionex. She was taking a dose every 12 hours up until the night before last. Cough is much better and her head congestion is just about gone however she is having lower abdominal Worse stomach pain with pins and needles  . Possibly associated with constipation but no blood in her stool. Little bit better today since she stopped the cough medicine..  Feels weak when standing up.   And just not right. Maybe a little better since she stopped it no chest pain shortness of breath syncope palpitations or bleeding Mostly fluid. Intake over the last few days until today Today  Had   Coffee some sugar.  Bacon and egg sandwich.  ROS: See pertinent positives and negatives per HPI. 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 Daughter has questions about that also no restless leg no obvious sleep apnea. Daughter takes vitamins and it makes her feel better and asks about this.  Past Medical History  Diagnosis Date  . Low back pain   . Diastolic dysfunction     Mild Echo  . Alkaline phosphatase elevation     hx-neg lfts  . Hyperlipidemia   . Hypertension     Family History  Problem Relation Age of Onset  . Breast cancer Sister   . Arthritis Mother   . Hypertension Mother   . Breast cancer Mother     cancer in the blood  . Heart disease Father     History   Social History  . Marital Status: Married    Spouse Name: N/A    Number of Children: N/A  . Years of Education: N/A   Social History Main Topics  .  Smoking status: Never Smoker   . Smokeless tobacco: None  . Alcohol Use: No  . Drug Use: No  . Sexual Activity: None   Other Topics Concern  . 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 amLODipine (NORVASC) 10 MG tablet TAKE 1 TABLET (10 MG TOTAL) BY MOUTH DAILY.  90 tablet  0  . lisinopril-hydrochlorothiazide (PRINZIDE,ZESTORETIC) 20-12.5 MG per tablet TAKE 1 TABLET BY MOUTH ONCE DAILY  90 tablet  0  . Multiple Vitamin (MULTIVITAMIN) tablet Take 1 tablet by mouth daily.        . chlorpheniramine-HYDROcodone (TUSSIONEX PENNKINETIC ER) 10-8 MG/5ML LQCR Take 5 mLs by mouth at bedtime as needed.  140 mL  0   No facility-administered encounter medications on file as of 01/09/2013.    EXAM:  BP 104/70  Pulse 87  Temp(Src) 97.8 F (36.6 C) (Oral)  Wt 257 lb (116.574 kg)  BMI 42.1 kg/m2  SpO2 94%  Body mass index is 42.1 kg/(m^2).  GENERAL: vitals reviewed and listed above, alert, oriented, appears well hydrated and in no acute distress no ntoxic  Ambulatory can get on table independently looks a bit tired mild linp HEENT: atraumatic, conjunctiva  clear, no obvious abnormalities on inspection of external nose and ears OP : no lesion edema or exudate  NECK: no obvious masses on inspection palpation  No jvd  LUNGS: clear to auscultation bilaterally, no wheezes, rales or rhonchi, good air movement CV: HRRR, no clubbing cyanosis or  peripheral edema nl cap refill  dont hear murmur or click  Or rub   MS: moves all extremities  No acute change  Abdomen:  Sof,t normal bowel sounds without hepatosplenomegaly, no guarding rebound or masses no CVA tenderness PSYCH: pleasant and cooperative, no obvious depression or anxiety Skin: normal capillary refill  ,turgor , color: No acute rashes ,petechiae or bruising Wt Readings from Last 3 Encounters:  01/09/13 257 lb (116.574 kg)  12/31/12 262 lb (118.842 kg)  10/10/12 259 lb (117.482 kg)    ASSESSMENT AND PLAN:  Discussed the following assessment and plan:  Other malaise and fatigue - Plan: Basic metabolic panel, CBC with Differential, Hemoglobin A1c, Hepatic function panel, TSH, T4, free, Sedimentation rate, POCT urinalysis dipstick  HYPERLIPIDEMIA - Plan: Basic metabolic panel, CBC with Differential, Hemoglobin A1c, Hepatic function panel, TSH, T4, free, Sedimentation rate, POCT urinalysis dipstick  HYPERTENSION - Plan: Basic metabolic panel, CBC with Differential, Hemoglobin A1c, Hepatic function panel, TSH, T4, free, Sedimentation rate, POCT urinalysis dipstick  Medication side effect, initial encounter - probably  tussinex  - Plan: Basic metabolic panel, CBC with Differential, Hemoglobin A1c, Hepatic function panel, TSH, T4, free, Sedimentation rate, POCT urinalysis dipstick  Need for prophylactic vaccination and inoculation against influenza  Sleep disturbance, unspecified - Ongoing worse 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 No history restless leg sleep apnea. Discussed sleep hygiene options. I suspect overall that she has had significant side effect from the long acting cough medicine antihistamine adding to her baseline symptoms. No evidence of pneumonia or respiratory compromise today. Check labs metabolic rule out. Her blood pressure is on the low side but I don't see evidence of infection or cardiac decompensation. Daughter can check blood pressure readings this week if there indeed consistently low with fatigue consider adjusting her medication. Stated it was in the 127 range in the recent past. Ok to get flu vaccine today -Patient advised to return or notify health care team  if symptoms worsen or persist or new concerns arise.  Patient Instructions  irt is possible that  the cough medicine was too strong for the way taken.  Labs today to make sure no other cause of sx.  Check Bp readings  The next few week . s   Send [place] readings if too low may need to decrease medication.  Contact us about how you are doing in the week   Ok to take centrum silver .     [name]. Nysa Sarin M.D.  Explain to daughter  Sign up for patient portal with the patient's permission.

## 2013-01-10 LAB — BASIC METABOLIC PANEL
BUN: 18 mg/dL (ref 6–23)
CO2: 31 mEq/L (ref 19–32)
Calcium: 9.7 mg/dL (ref 8.4–10.5)
Creatinine, Ser: 0.9 mg/dL (ref 0.4–1.2)

## 2013-01-10 LAB — HEPATIC FUNCTION PANEL
ALT: 19 U/L (ref 0–35)
Bilirubin, Direct: 0 mg/dL (ref 0.0–0.3)
Total Bilirubin: 0.4 mg/dL (ref 0.3–1.2)

## 2013-01-10 LAB — TSH: TSH: 0.53 u[IU]/mL (ref 0.35–5.50)

## 2013-01-10 LAB — SEDIMENTATION RATE: Sed Rate: 53 mm/hr — ABNORMAL HIGH (ref 0–22)

## 2013-01-10 LAB — T4, FREE: Free T4: 1.12 ng/dL (ref 0.60–1.60)

## 2013-01-11 ENCOUNTER — Ambulatory Visit: Payer: Medicare Other | Admitting: Internal Medicine

## 2013-01-14 ENCOUNTER — Telehealth: Payer: Self-pay

## 2013-01-14 NOTE — Telephone Encounter (Signed)
[facility] Triage Call Report Triage Record Num: [id number] Operator: Donnella Sham Patient Name: Nina Le Call Date & Time: 01/11/2013 6:31:43PM Patient Phone: [phone] PCP: Patient Gender: Female PCP Fax : Patient DOB: 07/20/1940 [facility] Name[facility] Reason for Call: Caller: Nina Le/Patient; PCP: Berniece Andreas (Family Practice); CB#: [phone]; Call regarding Returning call from the office; Nina said she missed a call earlier from the office; said she was in for an OV 01/09/13 and blood work was done; thought they may have been calling about her labs; checked epic and found not from Dr.Panosh on labs, including an inflammation marker, getting rest and using elevation; informed her that it would be discussed at her next visit; pt expressed understanding Protocol(s) Used: Office Note Recommended Outcome per Protocol: Information Noted and Sent to Office Reason for Outcome: Caller information to office Care Advice: ~

## 2013-01-16 ENCOUNTER — Ambulatory Visit (INDEPENDENT_AMBULATORY_CARE_PROVIDER_SITE_OTHER): Payer: Medicare Other | Admitting: Internal Medicine

## 2013-01-16 ENCOUNTER — Encounter: Payer: Self-pay | Admitting: Internal Medicine

## 2013-01-16 VITALS — BP 118/78 | HR 69 | Temp 98.1°F | Wt 263.0 lb

## 2013-01-16 DIAGNOSIS — I10 Essential (primary) hypertension: Secondary | ICD-10-CM

## 2013-01-16 DIAGNOSIS — R5381 Other malaise: Secondary | ICD-10-CM

## 2013-01-16 DIAGNOSIS — E669 Obesity, unspecified: Secondary | ICD-10-CM

## 2013-01-16 DIAGNOSIS — M549 Dorsalgia, unspecified: Secondary | ICD-10-CM

## 2013-01-16 NOTE — Patient Instructions (Signed)
Let  me know if  you wish to see a nutritionist  Or get rheumatology  appointment.     Weight loss will help  The pain and energy level  And prevent you from getting diabetes.   Goal   Weight  loss  2 pounds per months  Is  very slow  .  But more likely to maintain.  Track at least 2 weeks of  intake.  Decrease portion. Size  simple carbs  breasts potatoes .  ROV in   3 months.

## 2013-01-16 NOTE — Progress Notes (Signed)
Chief Complaint  Patient presents with  . Follow-up    Says she is feeling much better.    HPI: Patient comes in today for follow up of  multiple medical problems.  Here with daughter  See last visit    Since then  much better  Still low.  About  Improved   energy level and asks about energy drinks   says left  knee and back give her problem when trying to her hh tasks .   Likes breads carbs not as many sweets.  No falling no cp sob   Does ache some  Legs   ROS: See pertinent positives and negatives per HPI.no cough syncope cp   Past Medical History  Diagnosis Date  . Low back pain   . Diastolic dysfunction     Mild Echo  . Alkaline phosphatase elevation     hx-neg lfts  . Hyperlipidemia   . Hypertension   . Acute back pain 03/12/2011    Family History  Problem Relation Age of Onset  . Breast cancer Sister   . Arthritis Mother   . Hypertension Mother   . Breast cancer Mother     cancer in the blood  . Heart disease Father     History   Social History  . Marital Status: Married    Spouse Name: N/A    Number of Children: N/A  . Years of Education: N/A   Social History Main Topics  . Smoking status: Never Smoker   . Smokeless tobacco: None  . Alcohol Use: No  . Drug Use: No  . Sexual Activity: None   Other Topics Concern  . 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 amLODipine (NORVASC) 10 MG tablet TAKE 1 TABLET (10 MG TOTAL) BY MOUTH DAILY.  90 tablet  0  . lisinopril-hydrochlorothiazide (PRINZIDE,ZESTORETIC) 20-12.5 MG per tablet TAKE 1 TABLET BY MOUTH ONCE DAILY  90 tablet  0  . Multiple Vitamin (MULTIVITAMIN) tablet Take 1 tablet by mouth daily.        . [DISCONTINUED] chlorpheniramine-HYDROcodone (TUSSIONEX  PENNKINETIC ER) 10-8 MG/5ML LQCR Take 5 mLs by mouth at bedtime as needed.  140 mL  0   No facility-administered encounter medications on file as of 01/16/2013.    EXAM:  BP 118/78  Pulse 69  Temp(Src) 98.1 F (36.7 C) (Oral)  Wt 263 lb (119.296 kg)  BMI 43.08 kg/m2  SpO2 98%  Body mass index is 43.08 kg/(m^2).  GENERAL: vitals reviewed and listed above, alert, oriented, appears well hydrated and in no acute distress more alert   HEENT: atraumatic, conjunctiva  clear, no obvious abnormalities on inspection of external nose and ears  NECK: no obvious masses on inspection palpation  LUNGS: clear to auscultation bilaterally, no wheezes, rales or rhonchi, good air movement CV: HRRR,  No g or m no clubbing cyanosis  nl cap refill  Slight edema  No  lesions  MS: moves all extremities without noticeable focal  Abnormality limps a bit left knee PSYCH: pleasant and cooperative, no obvious depression or anxiety Lab Results  Component Value Date   WBC 10.1 01/09/2013  HGB 13.7 01/09/2013   HCT 42.3 01/09/2013   PLT 311.0 01/09/2013   GLUCOSE 95 01/09/2013   CHOL 190 10/25/2011   TRIG 50.0 10/25/2011   HDL 49.10 10/25/2011   LDLCALC 131* 10/25/2011   ALT 19 01/09/2013   AST 22 01/09/2013   NA 138 01/09/2013   K 4.9 01/09/2013   CL 101 01/09/2013   CREATININE 0.9 01/09/2013   BUN 18 01/09/2013   CO2 31 01/09/2013   TSH 0.53 01/09/2013   HGBA1C 6.3 01/09/2013    ASSESSMENT AND PLAN: reviewed labs  No diabetes but at risk  Discussed the following assessment and plan:  TIREDNESS  OBESITY  HYPERTENSION - controlled   BACK PAIN Disc   Elevated esr consider see rheum ? If could have PMR as well as OA   Weight is an issue and daughter thinks shes not convinced that this is important so we had a long discussion about this and losing weight to help her joints and energy level . Consider seeing nutritionist for help  For now dec breads  potatoes fried foods any calory drinks and goal of  About 2  pound loss per month  At least.   Contact us if want to get rheum consult  -Patient advised to return or notify health care team  if symptoms worsen or persist or new concerns arise.  Patient Instructions  Let  me know if  you wish to see a nutritionist  Or get rheumatology  appointment.     Weight loss will help  The pain and energy level  And prevent you from getting diabetes.   Goal   Weight  loss  2 pounds per months  Is  very slow  .  But more likely to maintain.  Track at least 2 weeks of  intake.  Decrease portion. Size  simple carbs  breasts potatoes .  ROV in   3 months.      Nina Le. Panosh M.D. Wt Readings from Last 3 Encounters:  01/16/13 263 lb (119.296 kg)  01/09/13 257 lb (116.574 kg)  12/31/12 262 lb (118.842 kg)   Total visit > 50% spent counseling and coordinating care

## 2013-04-15 ENCOUNTER — Other Ambulatory Visit: Payer: Self-pay | Admitting: Internal Medicine

## 2013-04-15 ENCOUNTER — Telehealth: Payer: Self-pay | Admitting: Internal Medicine

## 2013-04-15 MED ORDER — LISINOPRIL-HYDROCHLOROTHIAZIDE 20-12.5 MG PO TABS: ORAL_TABLET | ORAL | Status: DC

## 2013-04-15 MED ORDER — AMLODIPINE BESYLATE 10 MG PO TABS: ORAL_TABLET | ORAL | Status: DC

## 2013-04-15 NOTE — Telephone Encounter (Signed)
Rx's sent to pharmacy.  

## 2013-04-15 NOTE — Telephone Encounter (Signed)
Patient called requesting a refill on lisinopril-hydrochlorothiazide (PRINZIDE,ZESTORETIC) 20-12.5 MG per tablet  And amLODipine (NORVASC) 10 MG tablet  Called into [facility] -please advise

## 2013-04-15 NOTE — Telephone Encounter (Signed)
Ok to refill x 6 months  Tell her she is due soon for ROV .

## 2013-04-16 NOTE — Telephone Encounter (Signed)
Called to inform patient rx was sent to pharmacy and that she is due soon for an OV

## 2013-11-19 ENCOUNTER — Other Ambulatory Visit: Payer: Self-pay | Admitting: Family Medicine

## 2013-11-19 MED ORDER — LISINOPRIL-HYDROCHLOROTHIAZIDE 20-12.5 MG PO TABS: ORAL_TABLET | ORAL | Status: DC

## 2013-11-19 MED ORDER — AMLODIPINE BESYLATE 10 MG PO TABS: ORAL_TABLET | ORAL | Status: DC

## 2013-11-19 NOTE — Telephone Encounter (Signed)
Sent to the pharmacy by e-scribe.  #90 with 0 additional refills.  Pt made upcoming appt on 12/03/13 for med check and BP check.

## 2013-11-26 ENCOUNTER — Other Ambulatory Visit: Payer: Self-pay

## 2013-11-26 DIAGNOSIS — Z1231 Encounter for screening mammogram for malignant neoplasm of breast: Secondary | ICD-10-CM

## 2013-12-03 ENCOUNTER — Encounter: Payer: Self-pay | Admitting: Internal Medicine

## 2013-12-03 ENCOUNTER — Ambulatory Visit (INDEPENDENT_AMBULATORY_CARE_PROVIDER_SITE_OTHER): Payer: Medicare Other | Admitting: Internal Medicine

## 2013-12-03 VITALS — BP 136/66 | Temp 98.5°F | Ht 65.25 in | Wt 270.0 lb

## 2013-12-03 DIAGNOSIS — I10 Essential (primary) hypertension: Secondary | ICD-10-CM

## 2013-12-03 DIAGNOSIS — R7309 Other abnormal glucose: Secondary | ICD-10-CM

## 2013-12-03 DIAGNOSIS — R609 Edema, unspecified: Secondary | ICD-10-CM

## 2013-12-03 DIAGNOSIS — R739 Hyperglycemia, unspecified: Secondary | ICD-10-CM | POA: Insufficient documentation

## 2013-12-03 DIAGNOSIS — E785 Hyperlipidemia, unspecified: Secondary | ICD-10-CM

## 2013-12-03 DIAGNOSIS — E669 Obesity, unspecified: Secondary | ICD-10-CM

## 2013-12-03 LAB — POCT URINALYSIS DIP (MANUAL ENTRY)
Bilirubin, UA: NEGATIVE
Blood, UA: NEGATIVE
GLUCOSE UA: NEGATIVE
Ketones, POC UA: NEGATIVE
Nitrite, UA: NEGATIVE
Protein Ur, POC: NEGATIVE
Spec Grav, UA: 1.01
UROBILINOGEN UA: 0.2
pH, UA: 5.5

## 2013-12-03 LAB — BASIC METABOLIC PANEL
BUN: 14 mg/dL (ref 6–23)
CALCIUM: 9.6 mg/dL (ref 8.4–10.5)
CO2: 29 meq/L (ref 19–32)
CREATININE: 0.7 mg/dL (ref 0.4–1.2)
Chloride: 103 mEq/L (ref 96–112)
GFR: 105.99 mL/min (ref >60.00)
GLUCOSE: 76 mg/dL (ref 70–99)
Potassium: 4.5 mEq/L (ref 3.5–5.1)
Sodium: 140 mEq/L (ref 135–145)

## 2013-12-03 LAB — TSH: TSH: 0.68 u[IU]/mL (ref 0.35–4.50)

## 2013-12-03 LAB — HEMOGLOBIN A1C: HEMOGLOBIN A1C: 6.1 % (ref 4.6–6.5)

## 2013-12-03 MED ORDER — FUROSEMIDE 20 MG PO TABS: 20.0000 mg | ORAL_TABLET | Freq: Every day | ORAL | Status: DC

## 2013-12-03 NOTE — Patient Instructions (Addendum)
Will notify you  of labs when available. Trial of lasix   A stronger fluid pill for the next 2 weeks and then check potassium level  If we stay on this med we may need to add potassium  Pills .  And recheck   Healthy lifestyle includes : At least 150 minutes of acitiviy  Per  weeks  , weight at healthy levels, which is usually   BMI 19-25. Avoid trans fats and processed foods;  Increase fresh fruits and veges to 5 servings per day. And avoid sweet beverages including tea and juice. Mediterranean diet with olive oil and nuts have been noted to be heart and brain healthy . Avoid tobacco products . Limit  alcohol to  No more than  7 per week for women and 14 servings for men.  Get adequate sleep .

## 2013-12-03 NOTE — Progress Notes (Signed)
Pre visit review using our clinic review tool, if applicable. No additional management support is needed unless otherwise documented below in the visit note.  Chief Complaint  Patient presents with  . Follow-up    Has ankle swelling.  Will wait to get high dose flu vaccine in Sept.    HPI: Nina Le Last seen in fall 2014 for fatigue felt multifactorial and addressed obesity and weight and bp and lsi    Blood pressure   140 range . Or less at home taking amlodipine and lisinopril HCTZ. Taking every day .  Sleeps ok  No diturvance  4-6 hours  Cut grass. 2-3 hours on riding mower 4 acres Since she has swelling in her legs that go down at night but, op as the day goes on with her legs down. She tries to be physically active says she only needs to have meals a day. Feels her breathing is stable. Joints are the limiting factor of her activity denies chest long limitations. At this time. ROS: See pertinent positives and negatives per HPI. No unusual bruising bleeding depression. No UTI symptoms.  Past Medical History  Diagnosis Date  . Low back pain   . Diastolic dysfunction     Mild Echo  . Alkaline phosphatase elevation     hx-neg lfts  . Hyperlipidemia   . Hypertension   . Acute back pain 03/12/2011    Family History  Problem Relation Age of Onset  . Breast cancer Sister   . Arthritis Mother   . Hypertension Mother   . Breast cancer Mother     cancer in the blood  . Heart disease Father     History   Social History  . Marital Status: Married    Spouse Name: N/A    Number of Children: N/A  . Years of Education: N/A   Social History Main Topics  . Smoking status: Never Smoker   . Smokeless tobacco: None  . Alcohol Use: No  . Drug Use: No  . Sexual Activity: None   Other Topics Concern  . 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 amLODipine (NORVASC) 10 MG tablet TAKE 1 TABLET (10 MG TOTAL) BY MOUTH DAILY.  Marland Kitchen lisinopril-hydrochlorothiazide (PRINZIDE,ZESTORETIC) 20-12.5 MG per tablet TAKE 1 TABLET BY MOUTH ONCE DAILY  . Multiple Vitamin (MULTIVITAMIN) tablet Take 1 tablet by mouth daily.    . furosemide (LASIX) 20 MG tablet Take 1 tablet (20 mg total) by mouth daily. For 2-3 weeks and then as directed    EXAM:  BP 136/66  Temp(Src) 98.5 F (36.9 C) (Oral)  Ht 5' 5.25" (1.657 m)  Wt 270 lb (122.471 kg)  BMI 44.61 kg/m2  Body mass index is 44.61 kg/(m^2).  GENERAL: vitals reviewed and listed above, alert, oriented, appears well hydrated and in no acute distress HEENT: atraumatic, conjunctiva  clear, no obvious abnormalities on inspection of external nose and ears OP : no lesion edema or exudate  NECK: no obvious masses on inspection palpation no JVD or adenopathy LUNGS: clear to auscultation bilaterally, no wheezes, rales or rhonchi, CV: HRRR, no clubbing cyanosis or   nl cap refill  do not hear a murmur or gallop. Large chest wall. +1 edema to +2 edema distal lower strategies no acute findings redness streaking MS: moves all extremities without noticeable focal  abnormality mildly antalgic gait but ambulatory without assistance able to get up on the table without assistance. PSYCH: pleasant and cooperative, no obvious depression or anxiety Lab Results  Component Value Date   WBC 10.1 01/09/2013   HGB 13.7 01/09/2013   HCT 42.3 01/09/2013   PLT 311.0 01/09/2013   GLUCOSE 95 01/09/2013   CHOL 190 10/25/2011   TRIG 50.0 10/25/2011   HDL 49.10 10/25/2011   LDLCALC 131* 10/25/2011   ALT 19 01/09/2013   AST 22 01/09/2013   NA 138 01/09/2013   K 4.9 01/09/2013   CL 101 01/09/2013   CREATININE 0.9 01/09/2013   BUN 18 01/09/2013   CO2 31 01/09/2013   TSH 0.53 01/09/2013   HGBA1C 6.3 01/09/2013   Wt Readings from Last 3  Encounters:  12/03/13 270 lb (122.471 kg)  01/16/13 263 lb (119.296 kg)  01/09/13 257 lb (116.574 kg)    ASSESSMENT AND PLAN:  Discussed the following assessment and plan:  Unspecified essential hypertension - controlled  - Plan: Basic metabolic panel, TSH, Hemoglobin A1c, POCT urinalysis dipstick  Obesity, unspecified - Unfortunately must worse than better some possibly fluid retention but no active acute heart failure or other obvious cause - Plan: Basic metabolic panel, TSH, Hemoglobin A1c, POCT urinalysis dipstick  Other and unspecified hyperlipidemia - Plan: Basic metabolic panel, TSH, Hemoglobin A1c, POCT urinalysis dipstick  Hyperglycemia - check a1c and bmp today  - Plan: Basic metabolic panel, TSH, Hemoglobin A1c, POCT urinalysis dipstick  Edema - Dependent decreases with elevation possibly medication weight other cause. Trial of Lasix follow potassium consider change her lisinopril HCTZ to plain lisinopr - Plan: Basic metabolic panel, TSH, Hemoglobin A1c, POCT urinalysis dipstick Due in 2 months for labs can be done today  -Patient advised to return or notify health care team  if symptoms worsen ,persist or new concerns arise.  Patient Instructions  Will notify you  of labs when available. Trial of lasix   A stronger fluid pill for the next 2 weeks and then check potassium level  If we stay on this med we may need to add potassium  Pills .  And recheck   Healthy lifestyle includes : At least 150 minutes of acitiviy  Per  weeks  , weight at healthy levels, which is usually   BMI 19-25. Avoid trans fats and processed foods;  Increase fresh fruits and veges to 5 servings per day. And avoid sweet beverages including tea and juice. Mediterranean diet with olive oil and nuts have been noted to be heart and brain healthy . Avoid tobacco products . Limit  alcohol to  No more than  7 per week for women and 14 servings for men.  Get adequate sleep .    Standley Brooking. Panosh M.D.

## 2013-12-04 ENCOUNTER — Telehealth: Payer: Self-pay | Admitting: Internal Medicine

## 2013-12-04 NOTE — Telephone Encounter (Signed)
Relevant patient education assigned to patient using Emmi. ° °

## 2013-12-26 ENCOUNTER — Encounter: Payer: Self-pay | Admitting: Internal Medicine

## 2013-12-26 ENCOUNTER — Ambulatory Visit (INDEPENDENT_AMBULATORY_CARE_PROVIDER_SITE_OTHER): Payer: Medicare Other | Admitting: Internal Medicine

## 2013-12-26 VITALS — BP 120/80 | Temp 97.8°F | Ht 65.25 in | Wt 270.0 lb

## 2013-12-26 DIAGNOSIS — R739 Hyperglycemia, unspecified: Secondary | ICD-10-CM

## 2013-12-26 DIAGNOSIS — I10 Essential (primary) hypertension: Secondary | ICD-10-CM

## 2013-12-26 DIAGNOSIS — R7309 Other abnormal glucose: Secondary | ICD-10-CM

## 2013-12-26 DIAGNOSIS — I872 Venous insufficiency (chronic) (peripheral): Secondary | ICD-10-CM

## 2013-12-26 DIAGNOSIS — IMO0002 Reserved for concepts with insufficient information to code with codable children: Secondary | ICD-10-CM

## 2013-12-26 DIAGNOSIS — M1710 Unilateral primary osteoarthritis, unspecified knee: Secondary | ICD-10-CM

## 2013-12-26 DIAGNOSIS — R609 Edema, unspecified: Secondary | ICD-10-CM

## 2013-12-26 NOTE — Progress Notes (Signed)
Pre visit review using our clinic review tool, if applicable. No additional management support is needed unless otherwise documented below in the visit note.  Chief Complaint  Patient presents with  . Follow-up    HPI: Nina Le is 73 y.o. comes in for fu of edema and bp and labs  Here with daughter . Since ;ast  time note swelling  Better in am and gets worse as day goes on.  rihg more than  Left . Left knee has been a problem history of injections with cortisone a while back and Dr. Inda Merlin office has to be careful when she gets up. Has tried different need braces not that helpful. Didn't take the lasix   But got filled Bp readings at homea re in the 118 range and controlled  Denies any fevers severe shortness of breath or PND  denies UTI symptoms Having a hard time losing weight working on  It likes bread  ROS: See pertinent positives and negatives per HPI.  Past Medical History  Diagnosis Date  . Low back pain   . Diastolic dysfunction     Mild Echo  . Alkaline phosphatase elevation     hx-neg lfts  . Hyperlipidemia   . Hypertension   . Acute back pain 03/12/2011    Family History  Problem Relation Age of Onset  . Breast cancer Sister   . Arthritis Mother   . Hypertension Mother   . Breast cancer Mother     cancer in the blood  . Heart disease Father     History   Social History  . Marital Status: Married    Spouse Name: N/A    Number of Children: N/A  . Years of Education: N/A   Social History Main Topics  . Smoking status: Never Smoker   . Smokeless tobacco: None  . Alcohol Use: No  . Drug Use: No  . Sexual Activity: None   Other Topics Concern  . 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 amLODipine (NORVASC) 10 MG  tablet TAKE 1 TABLET (10 MG TOTAL) BY MOUTH DAILY.  Marland Kitchen lisinopril-hydrochlorothiazide (PRINZIDE,ZESTORETIC) 20-12.5 MG per tablet TAKE 1 TABLET BY MOUTH ONCE DAILY  . Multiple Vitamin (MULTIVITAMIN) tablet Take 1 tablet by mouth daily.    . [DISCONTINUED] furosemide (LASIX) 20 MG tablet Take 1 tablet (20 mg total) by mouth daily. For 2-3 weeks and then as directed    EXAM:  BP 120/80  Temp(Src) 97.8 F (36.6 C) (Oral)  Ht 5' 5.25" (1.657 m)  Wt 270 lb (122.471 kg)  BMI 44.61 kg/m2  Body mass index is 44.61 kg/(m^2).  GENERAL: vitals reviewed and listed above, alert, oriented, appears well hydrated and in no acute distress HEENT: atraumatic, conjunctiva  clear, no obvious abnormalities on inspection of external nose and ears NECK: no obvious masses on inspection palpation  No jvd  LUNGS: clear to auscultation bilaterally, no wheezes, rales or rhonchi,  CV: HRRR, no clubbing cyanosis o i dont hear murmur today sitting exam  nl cap refill  MS: moves all extremities gait antalgic but steady   1 + edema right more than keft no chords  or  Redness  Left knee no acute warmth or redness  Large legs prob oa changes   PSYCH: pleasant and cooperative, no obvious depression or anxiety Lab Results  Component Value Date   WBC 10.1 01/09/2013   HGB 13.7 01/09/2013   HCT 42.3 01/09/2013   PLT 311.0 01/09/2013   GLUCOSE 76 12/03/2013   CHOL 190 10/25/2011   TRIG 50.0 10/25/2011   HDL 49.10 10/25/2011   LDLCALC 131* 10/25/2011   ALT 19 01/09/2013   AST 22 01/09/2013   NA 140 12/03/2013   K 4.5 12/03/2013   CL 103 12/03/2013   CREATININE 0.7 12/03/2013   BUN 14 12/03/2013   CO2 29 12/03/2013   TSH 0.68 12/03/2013   HGBA1C 6.1 12/03/2013    ASSESSMENT AND PLAN:  Discussed the following assessment and plan:  Edema - appears like dependent and not  obv sig chf renal disease etc  suspect gravity venous related  at this time comp stock 20 - 30 no lasix for nowcall if worse   HYPERTENSION - good control   continue  Hyperglycemia - normal on last labs  a 1 c  6.1   Unspecified venous (peripheral) insufficiency - suspected   Severe obesity (BMI >= 40) - reveiwed weigh tloss poss also   Arthritis of knee Dr Heinz Knuckles office .  Helps  Temporarily.  Had shots.  Wt Readings from Last 3 Encounters:  12/26/13 270 lb (122.471 kg)  12/03/13 270 lb (122.471 kg)  01/16/13 263 lb (119.296 kg)    Compression stocking rx for 20 - 30 thigh high  To use in day  Total visit 76mins > 50% spent counseling and coordinating care  Edema diet  Weight etc  "hard to give up bread " eats one meal per day mostly. Can call for lfu vaccine when avialable -Patient advised to return or notify health care team  if symptoms worsen ,persist or new concerns arise.  Patient Instructions  Blood pressure   Is excellent  Continue med as tolerated .   Add  Compression stockings Activity  If knee problematic consider   Seeing ortho again of PT. Continue monitoring BP readings  Losing weight .  Will help the knee and possibley the swelling.  Contact us if concerning sx as we discussed .  Any sever worsening of the swelling or any breathing difficulty .   Flu vaccine when  avialable    ROV in 3 months .    Standley Brooking. Panosh M.D.

## 2013-12-26 NOTE — Patient Instructions (Addendum)
Blood pressure   Is excellent  Continue med as tolerated .   Add  Compression stockings Activity  If knee problematic consider   Seeing ortho again of PT. Continue monitoring BP readings  Losing weight .  Will help the knee and possibley the swelling.  Contact us if concerning sx as we discussed .  Any sever worsening of the swelling or any breathing difficulty .   Flu vaccine when  avialable    ROV in 3 months .

## 2014-01-03 ENCOUNTER — Ambulatory Visit: Payer: Medicare Other

## 2014-02-06 ENCOUNTER — Ambulatory Visit
Admission: RE | Admit: 2014-02-06 | Discharge: 2014-02-06 | Disposition: A | Discharge status: Home / Self Care | Payer: Medicare Other | Source: Ambulatory Visit

## 2014-02-06 DIAGNOSIS — Z1231 Encounter for screening mammogram for malignant neoplasm of breast: Secondary | ICD-10-CM

## 2014-02-28 ENCOUNTER — Other Ambulatory Visit: Payer: Self-pay | Admitting: Internal Medicine

## 2014-02-28 ENCOUNTER — Telehealth: Payer: Self-pay | Admitting: Internal Medicine

## 2014-02-28 NOTE — Telephone Encounter (Signed)
Sent to the pharmacy by e-scribe. 

## 2014-02-28 NOTE — Telephone Encounter (Signed)
Duplicate message.  Sent by e-scribe.

## 2014-02-28 NOTE — Telephone Encounter (Signed)
Pt request refill of the following: amLODipine (NORVASC) 10 MG tablet,  lisinopril-hydrochlorothiazide (PRINZIDE,ZESTORETIC) 20-12.5 MG per tablet    Phamacy: [facility]

## 2014-03-27 ENCOUNTER — Encounter: Payer: Self-pay | Admitting: Internal Medicine

## 2014-03-27 ENCOUNTER — Ambulatory Visit (INDEPENDENT_AMBULATORY_CARE_PROVIDER_SITE_OTHER): Payer: Medicare Other | Admitting: Internal Medicine

## 2014-03-27 VITALS — BP 122/82 | Temp 98.0°F | Ht 65.25 in | Wt 269.6 lb

## 2014-03-27 DIAGNOSIS — R609 Edema, unspecified: Secondary | ICD-10-CM

## 2014-03-27 DIAGNOSIS — I10 Essential (primary) hypertension: Secondary | ICD-10-CM | POA: Insufficient documentation

## 2014-03-27 DIAGNOSIS — Z23 Encounter for immunization: Secondary | ICD-10-CM

## 2014-03-27 NOTE — Progress Notes (Signed)
Pre visit review using our clinic review tool, if applicable. No additional management support is needed unless otherwise documented below in the visit note.  Chief Complaint  Patient presents with  . Follow-up    HPI:  Nina Le 73 y.o. comes for Chronic disease management battling excess weight fatigue edema and ht . Since alst visit has changed some intake  reviewed  trying to walk but some days too tired   Asks for handicapped permit.   frustrated not better weight loss Has compression stockings but hard to put on  Asks if safe to use Tumeric supp and vit d . No new sx no falling  ROS: See pertinent positives and negatives per HPI.  Past Medical History  Diagnosis Date  . Low back pain   . Diastolic dysfunction     Mild Echo  . Alkaline phosphatase elevation     hx-neg lfts  . Hyperlipidemia   . Hypertension   . Acute back pain 03/12/2011    Family History  Problem Relation Age of Onset  . Breast cancer Sister   . Arthritis Mother   . Hypertension Mother   . Breast cancer Mother     cancer in the blood  . Heart disease Father     History   Social History  . Marital Status: Married    Spouse Name: N/A    Number of Children: N/A  . Years of Education: N/A   Social History Main Topics  . Smoking status: Never Smoker   . Smokeless tobacco: None  . Alcohol Use: No  . Drug Use: No  . Sexual Activity: None   Other Topics Concern  . 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 amLODipine (NORVASC) 10 MG tablet TAKE 1 TABLET BY MOUTH ONCE A DAY  . Cholecalciferol (VITAMIN D-3 PO) Take 2,000 Units by mouth daily.  Marland Kitchen lisinopril-hydrochlorothiazide (PRINZIDE,ZESTORETIC) 20-12.5 MG per tablet TAKE 1 TABLET BY MOUTH ONCE A DAY  .  Multiple Vitamin (MULTIVITAMIN) tablet Take 1 tablet by mouth daily.      EXAM:  BP 122/82 mmHg  Temp(Src) 98 F (36.7 C) (Oral)  Ht 5' 5.25" (1.657 m)  Wt 269 lb 9.6 oz (122.29 kg)  BMI 44.54 kg/m2  Body mass index is 44.54 kg/(m^2). Wt Readings from Last 3 Encounters:  03/27/14 269 lb 9.6 oz (122.29 kg)  12/26/13 270 lb (122.471 kg)  12/03/13 270 lb (122.471 kg)   BP Readings from Last 3 Encounters:  03/27/14 122/82  12/26/13 120/80  12/03/13 136/66    GENERAL: vitals reviewed and listed above, alert, oriented, appears well hydrated and in no acute distress HEENT: atraumatic, conjunctiva  clear, no obvious abnormalities on inspection of external nose and ears  NECK: no obvious masses on inspection palpation  LUNGS: clear to auscultation bilaterally, no wheezes, rales or rhonchi, good air movement CV: HRRR, no clubbing cyanosis or  1+ edema nl cap refill  MS: moves all extremities without noticeable focal  Abnormality  Walking better than last time  Has to push up to get up from [place] seems good  PSYCH: pleasant and cooperative, no obvious depression or  anxiety Lab Results  Component Value Date   WBC 10.1 01/09/2013   HGB 13.7 01/09/2013   HCT 42.3 01/09/2013   PLT 311.0 01/09/2013   GLUCOSE 76 12/03/2013   CHOL 190 10/25/2011   TRIG 50.0 10/25/2011   HDL 49.10 10/25/2011   LDLCALC 131* 10/25/2011   ALT 19 01/09/2013   AST 22 01/09/2013   NA 140 12/03/2013   K 4.5 12/03/2013   CL 103 12/03/2013   CREATININE 0.7 12/03/2013   BUN 14 12/03/2013   CO2 29 12/03/2013   TSH 0.68 12/03/2013   HGBA1C 6.1 12/03/2013   Wt Readings from Last 3 Encounters:  03/27/14 269 lb 9.6 oz (122.29 kg)  12/26/13 270 lb (122.471 kg)  12/03/13 270 lb (122.471 kg)    ASSESSMENT AND PLAN:  Discussed the following assessment and plan:  Edema - stable consdier change meds if needed but weight loss and activity most effective   Severe obesity (BMI >= 40)  Essential  hypertension - controlled   Need for prophylactic vaccination and inoculation against influenza - Plan: Flu Vaccine QUAD 36+ mos PF IM (Fluarix Quad PF) Handicapped . Form signed but walk when she can. Total visit 32mins > 50% spent counseling and coordinating care weight control diet changes etc     -Patient advised to return or notify health care team  if symptoms worsen ,persist or new concerns arise.  Patient Instructions   Bp  Is good . Change  Condiments to lower calorie options.  Such as   Low fat yogurt .   And make ranch  Substitute.  Spreads with seasoning.   Don't give up.! 3500 calories is the energy content of a pound of body weight .Must have a 3500 cal deficit to lose one pound . Thus decrease 500 calorie equivalent per day in food or drink intake / or exercise  for 7 days to lose one pound. !       Why follow it? Research shows. . Those who follow the Mediterranean diet have a reduced risk of heart disease  . The diet is associated with a reduced incidence of Parkinson's and Alzheimer's diseases . People following the diet may have longer life expectancies and lower rates of chronic diseases  . The Dietary Guidelines for Americans recommends the Mediterranean diet as an eating plan to promote health and prevent disease  What Is the Mediterranean Diet?  . Healthy eating plan based on typical foods and recipes of Mediterranean-style cooking . The diet is primarily a plant based diet; these foods should make up a majority of meals   Starches - Plant based foods should make up a majority of meals - They are an important sources of vitamins, minerals, energy, antioxidants, and fiber - Choose whole grains, foods high in fiber and minimally processed items  - Typical grain sources include wheat, oats, barley, corn, brown rice, bulgar, farro, millet, polenta, couscous  - Various types of beans include chickpeas, lentils, fava beans, black beans, white beans   Fruits  Veggies -  Large quantities of antioxidant rich fruits & veggies; 6 or more servings  - Vegetables can be eaten raw or lightly drizzled with oil and cooked  - Vegetables common to the traditional Mediterranean Diet include: artichokes, arugula, beets, broccoli, brussel sprouts, cabbage, carrots, celery, collard greens, cucumbers, eggplant, kale, leeks, lemons, lettuce, mushrooms, okra, onions, peas, peppers, potatoes, pumpkin, radishes, rutabaga, shallots, spinach, sweet potatoes, turnips, zucchini - Fruits common to the Mediterranean Diet include: apples, apricots,  avocados, cherries, clementines, dates, figs, grapefruits, grapes, melons, nectarines, oranges, peaches, pears, pomegranates, strawberries, tangerines  Fats - Replace butter and margarine with healthy oils, such as olive oil, canola oil, and tahini  - Limit nuts to no more than a handful a day  - Nuts include walnuts, almonds, pecans, pistachios, pine nuts  - Limit or avoid candied, honey roasted or heavily salted nuts - Olives are central to the Mediterranean diet - can be eaten whole or used in a variety of dishes   Meats Protein - Limiting red meat: no more than a few times a month - When eating red meat: choose lean cuts and keep the portion to the size of deck of cards - Eggs: approx. 0 to 4 times a week  - Fish and lean poultry: at least 2 a week  - Healthy protein sources include, chicken, [place], lean beef, lamb - Increase intake of seafood such as tuna, salmon, trout, mackerel, shrimp, scallops - Avoid or limit high fat processed meats such as sausage and bacon  Dairy - Include moderate amounts of low fat dairy products  - Focus on healthy dairy such as fat free yogurt, skim milk, low or reduced fat cheese - Limit dairy products higher in fat such as whole or 2% milk, cheese, ice cream  Alcohol - Moderate amounts of red wine is ok  - No more than 5 oz daily for women (all ages) and men older than age 39  - No more than 10 oz of wine  daily for men younger than 89  Other - Limit sweets and other desserts  - Use herbs and spices instead of salt to flavor foods  - Herbs and spices common to the traditional Mediterranean Diet include: basil, bay leaves, chives, cloves, cumin, fennel, garlic, lavender, marjoram, mint, oregano, parsley, pepper, rosemary, sage, savory, sumac, tarragon, thyme   It's not just a diet, it's a lifestyle:  . The Mediterranean diet includes lifestyle factors typical of those in the region  . Foods, drinks and meals are best eaten with others and savored . Daily physical activity is important for overall good health . This could be strenuous exercise like running and aerobics . This could also be more leisurely activities such as walking, housework, yard-work, or taking the stairs . Moderation is the key; a balanced and healthy diet accommodates most foods and drinks . Consider portion sizes and frequency of consumption of certain foods   Meal Ideas & Options:  . Breakfast:  o Whole wheat toast or whole wheat English muffins with peanut butter & hard boiled egg o Steel cut oats topped with apples & cinnamon and skim milk  o Fresh fruit: banana, strawberries, melon, berries, peaches  o Smoothies: strawberries, bananas, greek yogurt, peanut butter o Low fat greek yogurt with blueberries and granola  o Egg white omelet with spinach and mushrooms o Breakfast couscous: whole wheat couscous, apricots, skim milk, cranberries  . Sandwiches:  o Hummus and grilled vegetables (peppers, zucchini, squash) on whole wheat bread   o Grilled chicken on whole wheat pita with lettuce, tomatoes, cucumbers or tzatziki  o Tuna salad on whole wheat bread: tuna salad made with greek yogurt, olives, red peppers, capers, green onions o Garlic rosemary lamb pita: lamb sauted with garlic, rosemary, salt & pepper; add lettuce, cucumber, greek yogurt to pita - flavor with lemon juice and black pepper  . Seafood:   o Mediterranean grilled salmon, seasoned with garlic, basil, parsley, lemon juice and  black pepper o Shrimp, lemon, and spinach whole-grain pasta salad made with low fat greek yogurt  o Seared scallops with lemon orzo  o Seared tuna steaks seasoned salt, pepper, coriander topped with tomato mixture of olives, tomatoes, olive oil, minced garlic, parsley, green onions and cappers  . Meats:  o Herbed greek chicken salad with kalamata olives, cucumber, feta  o Red bell peppers stuffed with spinach, bulgur, lean ground beef (or lentils) & topped with feta   o Kebabs: skewers of chicken, tomatoes, onions, zucchini, squash  o [place] burgers: made with red onions, mint, dill, lemon juice, feta cheese topped with roasted red peppers . Vegetarian o Cucumber salad: cucumbers, artichoke hearts, celery, red onion, feta cheese, tossed in olive oil & lemon juice  o Hummus and whole grain pita points with a greek salad (lettuce, tomato, feta, olives, cucumbers, red onion) o Lentil soup with celery, carrots made with vegetable broth, garlic, salt and pepper  o Tabouli salad: parsley, bulgur, mint, scallions, cucumbers, tomato, radishes, lemon juice, olive oil, salt and pepper.          Standley Brooking. Panosh M.D.

## 2014-03-27 NOTE — Patient Instructions (Addendum)
Bp  Is good . Change  Condiments to lower calorie options.  Such as   Low fat yogurt .   And make ranch  Substitute.  Spreads with seasoning.   Don't give up.! 3500 calories is the energy content of a pound of body weight .Must have a 3500 cal deficit to lose one pound . Thus decrease 500 calorie equivalent per day in food or drink intake / or exercise  for 7 days to lose one pound. !       Why follow it? Research shows. . Those who follow the Mediterranean diet have a reduced risk of heart disease  . The diet is associated with a reduced incidence of Parkinson's and Alzheimer's diseases . People following the diet may have longer life expectancies and lower rates of chronic diseases  . The Dietary Guidelines for Americans recommends the Mediterranean diet as an eating plan to promote health and prevent disease  What Is the Mediterranean Diet?  . Healthy eating plan based on typical foods and recipes of Mediterranean-style cooking . The diet is primarily a plant based diet; these foods should make up a majority of meals   Starches - Plant based foods should make up a majority of meals - They are an important sources of vitamins, minerals, energy, antioxidants, and fiber - Choose whole grains, foods high in fiber and minimally processed items  - Typical grain sources include wheat, oats, barley, corn, brown rice, bulgar, farro, millet, polenta, couscous  - Various types of beans include chickpeas, lentils, fava beans, black beans, white beans   Fruits  Veggies - Large quantities of antioxidant rich fruits & veggies; 6 or more servings  - Vegetables can be eaten raw or lightly drizzled with oil and cooked  - Vegetables common to the traditional Mediterranean Diet include: artichokes, arugula, beets, broccoli, brussel sprouts, cabbage, carrots, celery, collard greens, cucumbers, eggplant, kale, leeks, lemons, lettuce, mushrooms, okra, onions, peas, peppers, potatoes, pumpkin, radishes, rutabaga,  shallots, spinach, sweet potatoes, turnips, zucchini - Fruits common to the Mediterranean Diet include: apples, apricots, avocados, cherries, clementines, dates, figs, grapefruits, grapes, melons, nectarines, oranges, peaches, pears, pomegranates, strawberries, tangerines  Fats - Replace butter and margarine with healthy oils, such as olive oil, canola oil, and tahini  - Limit nuts to no more than a handful a day  - Nuts include walnuts, almonds, pecans, pistachios, pine nuts  - Limit or avoid candied, honey roasted or heavily salted nuts - Olives are central to the [employer] - can be eaten whole or used in a variety of dishes   Meats Protein - Limiting red meat: no more than a few times a month - When eating red meat: choose lean cuts and keep the portion to the size of deck of cards - Eggs: approx. 0 to 4 times a week  - Fish and lean poultry: at least 2 a week  - Healthy protein sources include, chicken, [place], lean beef, lamb - Increase intake of seafood such as tuna, salmon, trout, mackerel, shrimp, scallops - Avoid or limit high fat processed meats such as sausage and bacon  Dairy - Include moderate amounts of low fat dairy products  - Focus on healthy dairy such as fat free yogurt, skim milk, low or reduced fat cheese - Limit dairy products higher in fat such as whole or 2% milk, cheese, ice cream  Alcohol - Moderate amounts of red wine is ok  - No more than 5 oz daily for women (all  ages) and men older than age 67  - No more than 10 oz of wine daily for men younger than 31  Other - Limit sweets and other desserts  - Use herbs and spices instead of salt to flavor foods  - Herbs and spices common to the traditional Mediterranean Diet include: basil, bay leaves, chives, cloves, cumin, fennel, garlic, lavender, marjoram, mint, oregano, parsley, pepper, rosemary, sage, savory, sumac, tarragon, thyme   It's not just a diet, it's a lifestyle:  . The Mediterranean diet includes  lifestyle factors typical of those in the region  . Foods, drinks and meals are best eaten with others and savored . Daily physical activity is important for overall good health . This could be strenuous exercise like running and aerobics . This could also be more leisurely activities such as walking, housework, yard-work, or taking the stairs . Moderation is the key; a balanced and healthy diet accommodates most foods and drinks . Consider portion sizes and frequency of consumption of certain foods   Meal Ideas & Options:  . Breakfast:  o Whole wheat toast or whole wheat English muffins with peanut butter & hard boiled egg o Steel cut oats topped with apples & cinnamon and skim milk  o Fresh fruit: banana, strawberries, melon, berries, peaches  o Smoothies: strawberries, bananas, greek yogurt, peanut butter o Low fat greek yogurt with blueberries and granola  o Egg white omelet with spinach and mushrooms o Breakfast couscous: whole wheat couscous, apricots, skim milk, cranberries  . Sandwiches:  o Hummus and grilled vegetables (peppers, zucchini, squash) on whole wheat bread   o Grilled chicken on whole wheat pita with lettuce, tomatoes, cucumbers or tzatziki  o Tuna salad on whole wheat bread: tuna salad made with greek yogurt, olives, red peppers, capers, green onions o Garlic rosemary lamb pita: lamb sauted with garlic, rosemary, salt & pepper; add lettuce, cucumber, greek yogurt to pita - flavor with lemon juice and black pepper  . Seafood:  o Mediterranean grilled salmon, seasoned with garlic, basil, parsley, lemon juice and black pepper o Shrimp, lemon, and spinach whole-grain pasta salad made with low fat greek yogurt  o Seared scallops with lemon orzo  o Seared tuna steaks seasoned salt, pepper, coriander topped with tomato mixture of olives, tomatoes, olive oil, minced garlic, parsley, green onions and cappers  . Meats:  o Herbed greek chicken salad with kalamata olives,  cucumber, feta  o Red bell peppers stuffed with spinach, bulgur, lean ground beef (or lentils) & topped with feta   o Kebabs: skewers of chicken, tomatoes, onions, zucchini, squash  o [place] burgers: made with red onions, mint, dill, lemon juice, feta cheese topped with roasted red peppers . Vegetarian o Cucumber salad: cucumbers, artichoke hearts, celery, red onion, feta cheese, tossed in olive oil & lemon juice  o Hummus and whole grain pita points with a greek salad (lettuce, tomato, feta, olives, cucumbers, red onion) o Lentil soup with celery, carrots made with vegetable broth, garlic, salt and pepper  o Tabouli salad: parsley, bulgur, mint, scallions, cucumbers, tomato, radishes, lemon juice, olive oil, salt and pepper.

## 2014-04-18 ENCOUNTER — Ambulatory Visit (INDEPENDENT_AMBULATORY_CARE_PROVIDER_SITE_OTHER): Payer: Medicare Other | Admitting: Internal Medicine

## 2014-04-18 ENCOUNTER — Encounter: Payer: Self-pay | Admitting: Internal Medicine

## 2014-04-18 VITALS — BP 144/80 | HR 90 | Temp 98.0°F | Ht 65.25 in | Wt 262.0 lb

## 2014-04-18 DIAGNOSIS — R197 Diarrhea, unspecified: Secondary | ICD-10-CM

## 2014-04-18 DIAGNOSIS — R109 Unspecified abdominal pain: Secondary | ICD-10-CM

## 2014-04-18 DIAGNOSIS — R112 Nausea with vomiting, unspecified: Secondary | ICD-10-CM

## 2014-04-18 DIAGNOSIS — R531 Weakness: Secondary | ICD-10-CM

## 2014-04-18 LAB — CBC WITH DIFFERENTIAL/PLATELET
BASOS PCT: 0.4 % (ref 0.0–3.0)
Basophils Absolute: 0 10*3/uL (ref 0.0–0.1)
EOS PCT: 1.2 % (ref 0.0–5.0)
Eosinophils Absolute: 0.1 10*3/uL (ref 0.0–0.7)
HEMATOCRIT: 42.5 % (ref 36.0–46.0)
Hemoglobin: 13.4 g/dL (ref 12.0–15.0)
LYMPHS ABS: 2.4 10*3/uL (ref 0.7–4.0)
Lymphocytes Relative: 25.8 % (ref 12.0–46.0)
MCHC: 31.5 g/dL (ref 30.0–36.0)
MCV: 85.4 fl (ref 78.0–100.0)
MONO ABS: 0.7 10*3/uL (ref 0.1–1.0)
MONOS PCT: 7.8 % (ref 3.0–12.0)
NEUTROS ABS: 6 10*3/uL (ref 1.4–7.7)
Neutrophils Relative %: 64.8 % (ref 43.0–77.0)
PLATELETS: 303 10*3/uL (ref 150.0–400.0)
RBC: 4.98 Mil/uL (ref 3.87–5.11)
RDW: 14.1 % (ref 11.5–15.5)
WBC: 9.3 10*3/uL (ref 4.0–10.5)

## 2014-04-18 LAB — BASIC METABOLIC PANEL
BUN: 12 mg/dL (ref 6–23)
CHLORIDE: 105 meq/L (ref 96–112)
CO2: 29 meq/L (ref 19–32)
Calcium: 9.7 mg/dL (ref 8.4–10.5)
Creatinine, Ser: 0.7 mg/dL (ref 0.4–1.2)
GFR: 109.48 mL/min (ref >60.00)
Glucose, Bld: 96 mg/dL (ref 70–99)
POTASSIUM: 4.6 meq/L (ref 3.5–5.1)
Sodium: 142 mEq/L (ref 135–145)

## 2014-04-18 LAB — POCT URINALYSIS DIP (MANUAL ENTRY)
BILIRUBIN UA: NEGATIVE
Blood, UA: NEGATIVE
Glucose, UA: NEGATIVE
Ketones, POC UA: NEGATIVE
NITRITE UA: NEGATIVE
PH UA: 5
PROTEIN UA: NEGATIVE
Spec Grav, UA: 1.025
Urobilinogen, UA: 0.2

## 2014-04-18 LAB — HEPATIC FUNCTION PANEL
ALK PHOS: 91 U/L (ref 39–117)
ALT: 21 U/L (ref 0–35)
AST: 20 U/L (ref 0–37)
Albumin: 3.8 g/dL (ref 3.5–5.2)
BILIRUBIN DIRECT: 0 mg/dL (ref 0.0–0.3)
Total Bilirubin: 0.4 mg/dL (ref 0.2–1.2)
Total Protein: 7.7 g/dL (ref 6.0–8.3)

## 2014-04-18 NOTE — Patient Instructions (Addendum)
Need to hydrate  To avoid dehydration.  Will notify you  of labs when available.  Could have some dehydration .   gatorade  pedialyte  Hydration .ginger ale  Can try   Ranitidine   Twice a day or  prilosec  Every day to see if helps the pain.  Sounds like an infection that usually needs to run its course but it is lasting longer than unusual.      If not improving after weekend contact us for more advice and fu.  Call oncall service   If worse over weekend.   Viral Gastroenteritis Viral gastroenteritis is also known as stomach flu. This condition affects the stomach and intestinal tract. It can cause sudden diarrhea and vomiting. The illness typically lasts 3 to 8 days. Most people develop an immune response that eventually gets rid of the virus. While this natural response develops, the virus can make you quite ill. CAUSES  Many different viruses can cause gastroenteritis, such as rotavirus or noroviruses. You can catch one of these viruses by consuming contaminated food or water. You may also catch a virus by sharing utensils or other personal items with an infected person or by touching a contaminated surface. SYMPTOMS  The most common symptoms are diarrhea and vomiting. These problems can cause a severe loss of body fluids (dehydration) and a body salt (electrolyte) imbalance. Other symptoms may include:  Fever.  Headache.  Fatigue.  Abdominal pain. DIAGNOSIS  Your caregiver can usually diagnose viral gastroenteritis based on your symptoms and a physical exam. A stool sample may also be taken to test for the presence of viruses or other infections. TREATMENT  This illness typically goes away on its own. Treatments are aimed at rehydration. The most serious cases of viral gastroenteritis involve vomiting so severely that you are not able to keep fluids down. In these cases, fluids must be given through an intravenous line (IV). HOME CARE INSTRUCTIONS   Drink enough fluids to keep your  urine clear or pale yellow. Drink small amounts of fluids frequently and increase the amounts as tolerated.  Ask your caregiver for specific rehydration instructions.  Avoid:  Foods high in sugar.  Alcohol.  Carbonated drinks.  Tobacco.  Juice.  Caffeine drinks.  Extremely hot or cold fluids.  Fatty, greasy foods.  Too much intake of anything at one time.  Dairy products until 24 to 48 hours after diarrhea stops.  You may consume probiotics. Probiotics are active cultures of beneficial bacteria. They may lessen the amount and number of diarrheal stools in adults. Probiotics can be found in yogurt with active cultures and in supplements.  Wash your hands well to avoid spreading the virus.  Only take over-the-counter or prescription medicines for pain, discomfort, or fever as directed by your caregiver. Do not give aspirin to children. Antidiarrheal medicines are not recommended.  Ask your caregiver if you should continue to take your regular prescribed and over-the-counter medicines.  Keep all follow-up appointments as directed by your caregiver. SEEK IMMEDIATE MEDICAL CARE IF:   You are unable to keep fluids down.  You do not urinate at least once every 6 to 8 hours.  You develop shortness of breath.  You notice blood in your stool or vomit. This may look like coffee grounds.  You have abdominal pain that increases or is concentrated in one small area (localized).  You have persistent vomiting or diarrhea.  You have a fever.  The patient is a child younger than 3  months, and he or she has a fever.  The patient is a child older than 3 months, and he or she has a fever and persistent symptoms.  The patient is a child older than 3 months, and he or she has a fever and symptoms suddenly get worse.  The patient is a baby, and he or she has no tears when crying. MAKE SURE YOU:   Understand these instructions.  Will watch your condition.  Will get help right  away if you are not doing well or get worse. Document Released: 03/28/2005 Document Revised: 06/20/2011 Document Reviewed: 01/12/2011 [facility] Patient Information 2015 [place], [place]. This information is not intended to replace advice given to you by your health care provider. Make sure you discuss any questions you have with your health care provider.

## 2014-04-18 NOTE — Progress Notes (Signed)
Pre visit review using our clinic review tool, if applicable. No additional management support is needed unless otherwise documented below in the visit note.   Chief Complaint  Patient presents with  . Nausea  . Emesis  . Diarrhea    HPI: Nina Le acute illness with NVD since x mass  And hard to eat with abd pain. All 3 got sick   At home .  Same night  . Every on eat the same thing.  No fever soince then  But chills .  diarrhea this am soft .  Loose loose now .  No immodium pepto . Hard to pas gas unless certain position  No vomiting now just abd cramps after eating  Spaghetti last pm and   Hard to eat .  Medicine and water today  No blood no fever pos chills no uti sx  Feels weak  No syncope ROS: See pertinent positives and negatives per HPI. No cough cp sob new  Past Medical History  Diagnosis Date  . Low back pain   . Diastolic dysfunction     Mild Echo  . Alkaline phosphatase elevation     hx-neg lfts  . Hyperlipidemia   . Hypertension   . Acute back pain 03/12/2011    Family History  Problem Relation Age of Onset  . Breast cancer Sister   . Arthritis Mother   . Hypertension Mother   . Breast cancer Mother     cancer in the blood  . Heart disease Father     History   Social History  . Marital Status: Married    Spouse Name: N/A    Number of Children: N/A  . Years of Education: N/A   Social History Main Topics  . Smoking status: Never Smoker   . Smokeless tobacco: None  . Alcohol Use: No  . Drug Use: No  . Sexual Activity: None   Other Topics Concern  . 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 amLODipine (NORVASC) 10 MG tablet TAKE 1 TABLET BY MOUTH ONCE A DAY  . Cholecalciferol (VITAMIN D-3 PO) Take 2,000 Units by mouth  daily.  Marland Kitchen lisinopril-hydrochlorothiazide (PRINZIDE,ZESTORETIC) 20-12.5 MG per tablet TAKE 1 TABLET BY MOUTH ONCE A DAY  . Multiple Vitamin (MULTIVITAMIN) tablet Take 1 tablet by mouth daily.      EXAM:  BP 144/80 mmHg  Pulse 90  Temp(Src) 98 F (36.7 C) (Oral)  Ht 5' 5.25" (1.657 m)  Wt 262 lb (118.842 kg)  BMI 43.28 kg/m2  Body mass index is 43.28 kg/(m^2).  GENERAL: vitals reviewed and listed above, alert, oriented, appears well hydrated and in no acute distress no toxic looks tired  Ambulatory  HEENT: atraumatic, conjunctiva  clear, no obvious abnormalities on inspection of external nose and ears OP : no lesion edema or exudate  Moist mm NECK: no obvious masses on inspection palpation  LUNGS: clear to auscultation bilaterally, no wheezes, rales or rhonchi, CV: HRRR, no clubbing cyanosis or  peripheral edema nl cap refill  Abdomen:  Sof,t normal bowel sounds without hepatosplenomegaly, no guarding rebound or masses no CVA tenderness MS: moves all extremities gait no  change independent  PSYCH: pleasant and cooperative,   ASSESSMENT AND PLAN:  Discussed the following assessment and plan:  Nausea vomiting and diarrhea - Plan: Basic metabolic panel, CBC with Differential, POCT urinalysis dipstick, Hepatic function panel  Abdominal cramping - Plan: Basic metabolic panel, CBC with Differential, POCT urinalysis dipstick, Hepatic function panel  Slow convalescent r/o metabolic no obv obstruction  Context is as infectious GE.  Exam rassuing today   Expectant management. Close observation  -Patient advised to return or notify health care team  if symptoms worsen ,persist or new concerns arise.  Patient Instructions  Need to hydrate  To avoid dehydration.  Will notify you  of labs when available.  Could have some dehydration .   gatorade  pedialyte  Hydration .ginger ale  Can try   Ranitidine   Twice a day or  prilosec  Every day to see if helps the pain.  Sounds like an infection  that usually needs to run its course but it is lasting longer than unusual.      If not improving after weekend contact us for more advice and fu.  Call oncall service   If worse over weekend.   Viral Gastroenteritis Viral gastroenteritis is also known as stomach flu. This condition affects the stomach and intestinal tract. It can cause sudden diarrhea and vomiting. The illness typically lasts 3 to 8 days. Most people develop an immune response that eventually gets rid of the virus. While this natural response develops, the virus can make you quite ill. CAUSES  Many different viruses can cause gastroenteritis, such as rotavirus or noroviruses. You can catch one of these viruses by consuming contaminated food or water. You may also catch a virus by sharing utensils or other personal items with an infected person or by touching a contaminated surface. SYMPTOMS  The most common symptoms are diarrhea and vomiting. These problems can cause a severe loss of body fluids (dehydration) and a body salt (electrolyte) imbalance. Other symptoms may include:  Fever.  Headache.  Fatigue.  Abdominal pain. DIAGNOSIS  Your caregiver can usually diagnose viral gastroenteritis based on your symptoms and a physical exam. A stool sample may also be taken to test for the presence of viruses or other infections. TREATMENT  This illness typically goes away on its own. Treatments are aimed at rehydration. The most serious cases of viral gastroenteritis involve vomiting so severely that you are not able to keep fluids down. In these cases, fluids must be given through an intravenous line (IV). HOME CARE INSTRUCTIONS   Drink enough fluids to keep your urine clear or pale yellow. Drink small amounts of fluids frequently and increase the amounts as tolerated.  Ask your caregiver for specific rehydration instructions.  Avoid:  Foods high in sugar.  Alcohol.  Carbonated drinks.  Tobacco.  Juice.  Caffeine  drinks.  Extremely hot or cold fluids.  Fatty, greasy foods.  Too much intake of anything at one time.  Dairy products until 24 to 48 hours after diarrhea stops.  You may consume probiotics. Probiotics are active cultures of beneficial bacteria. They may lessen the amount and number of diarrheal stools in adults. Probiotics can be found in yogurt with active cultures and in supplements.  Wash your hands well to avoid spreading the virus.  Only take over-the-counter or prescription medicines for pain, discomfort, or fever as directed by your caregiver. Do not give aspirin to children. Antidiarrheal medicines are not recommended.  Ask your caregiver if you should  continue to take your regular prescribed and over-the-counter medicines.  Keep all follow-up appointments as directed by your caregiver. SEEK IMMEDIATE MEDICAL CARE IF:   You are unable to keep fluids down.  You do not urinate at least once every 6 to 8 hours.  You develop shortness of breath.  You notice blood in your stool or vomit. This may look like coffee grounds.  You have abdominal pain that increases or is concentrated in one small area (localized).  You have persistent vomiting or diarrhea.  You have a fever.  The patient is a child younger than 3 months, and he or she has a fever.  The patient is a child older than 3 months, and he or she has a fever and persistent symptoms.  The patient is a child older than 3 months, and he or she has a fever and symptoms suddenly get worse.  The patient is a baby, and he or she has no tears when crying. MAKE SURE YOU:   Understand these instructions.  Will watch your condition.  Will get help right away if you are not doing well or get worse. Document Released: 03/28/2005 Document Revised: 06/20/2011 Document Reviewed: 01/12/2011 [facility] Patient Information 2015 [place], [place]. This information is not intended to replace advice given to you by your health care  provider. Make sure you discuss any questions you have with your health care provider.      Standley Brooking. Panosh M.D.

## 2014-06-24 ENCOUNTER — Other Ambulatory Visit: Payer: Self-pay | Admitting: Internal Medicine

## 2014-06-24 NOTE — Telephone Encounter (Signed)
Sent to the pharmacy by e-scribe.  Has an upcoming appt on 07/28/14

## 2014-07-28 ENCOUNTER — Ambulatory Visit (INDEPENDENT_AMBULATORY_CARE_PROVIDER_SITE_OTHER): Payer: Medicare Other | Admitting: Internal Medicine

## 2014-07-28 ENCOUNTER — Encounter: Payer: Self-pay | Admitting: Internal Medicine

## 2014-07-28 VITALS — BP 126/80 | HR 85 | Temp 98.6°F | Resp 20 | Ht 65.25 in | Wt 268.0 lb

## 2014-07-28 DIAGNOSIS — M129 Arthropathy, unspecified: Secondary | ICD-10-CM

## 2014-07-28 DIAGNOSIS — R5383 Other fatigue: Secondary | ICD-10-CM

## 2014-07-28 DIAGNOSIS — I10 Essential (primary) hypertension: Secondary | ICD-10-CM

## 2014-07-28 DIAGNOSIS — M1710 Unilateral primary osteoarthritis, unspecified knee: Secondary | ICD-10-CM

## 2014-07-28 MED ORDER — AMLODIPINE BESYLATE 10 MG PO TABS: 10.0000 mg | ORAL_TABLET | Freq: Every day | ORAL | Status: DC

## 2014-07-28 MED ORDER — LISINOPRIL-HYDROCHLOROTHIAZIDE 20-12.5 MG PO TABS: 1.0000 | ORAL_TABLET | Freq: Every day | ORAL | Status: DC

## 2014-07-28 NOTE — Progress Notes (Signed)
Pre visit review using our clinic review tool, if applicable. No additional management support is needed unless otherwise documented below in the visit note. 

## 2014-07-28 NOTE — Progress Notes (Signed)
Chief Complaint  Patient presents with  . Follow-up    weight  . Hypertension    HPI: Nina Le  74 y.o.  Pt comes  For fu of medical issues   Her resp illness and gi all better  tookl a whiel  She is eaging healthier takign a liking to spinach and asparagus .    Cut out the baking and breads . About the same   Weight  And back  But has more energy and feeling much better with fatigue   Has a recbnet work  reunions and eating.  A bit more  But otherwise  Feels good.  Walking   3  X per week.  About 1/2 mile.-1 mile To sisters house  And back .  No decrease in exercise tolerance / ROS: See pertinent positives and negatives per HPI.  Past Medical History  Diagnosis Date  . Low back pain   . Diastolic dysfunction     Mild Echo  . Alkaline phosphatase elevation     hx-neg lfts  . Hyperlipidemia   . Hypertension   . Acute back pain 03/12/2011    Family History  Problem Relation Age of Onset  . Breast cancer Sister   . Arthritis Mother   . Hypertension Mother   . Breast cancer Mother     cancer in the blood  . Heart disease Father     History   Social History  . Marital Status: Married    Spouse Name: N/A  . Number of Children: N/A  . Years of Education: N/A   Social History Main Topics  . Smoking status: Never Smoker   . Smokeless tobacco: Not on file  . Alcohol Use: No  . Drug Use: No  . Sexual Activity: Not on file   Other Topics Concern  . 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 amLODipine (NORVASC) 10 MG tablet Take 1 tablet (10 mg total) by mouth daily.  . Cholecalciferol (VITAMIN D-3 PO) Take 2,000 Units by mouth daily.  Marland Kitchen lisinopril-hydrochlorothiazide (PRINZIDE,ZESTORETIC) 20-12.5 MG per tablet Take 1 tablet by mouth  daily.  . Multiple Vitamin (MULTIVITAMIN) tablet Take 1 tablet by mouth daily.    . [DISCONTINUED] amLODipine (NORVASC) 10 MG tablet TAKE 1 TABLET BY MOUTH ONCE A DAY  . [DISCONTINUED] lisinopril-hydrochlorothiazide (PRINZIDE,ZESTORETIC) 20-12.5 MG per tablet TAKE 1 TABLET BY MOUTH ONCE A DAY    EXAM:  BP 126/80 mmHg  Pulse 85  Temp(Src) 98.6 F (37 C) (Oral)  Resp 20  Ht 5' 5.25" (1.657 m)  Wt 268 lb (121.564 kg)  BMI 44.28 kg/m2  SpO2 97%  Body mass index is 44.28 kg/(m^2).  GENERAL: vitals reviewed and listed above, alert, oriented, appears well hydrated and in no acute distress looks well today walking much more easily  HEENT: atraumatic, conjunctiva  clear, no obvious abnormalities on inspection of external nose and ears ONECK: no obvious masses on inspection palpation  LUNGS: clear to auscultation bilaterally, no wheezes, rales or rhonchi, good air movement CV: HRRR, no clubbing cyanosis or  peripheral edema nl cap refill   MS: moves all extremities walking without a  ssistance and up from chair much better   PSYCH: pleasant and cooperative, no obvious depression or anxiety Lab Results  Component Value Date   WBC 9.3 04/18/2014   HGB 13.4 04/18/2014   HCT 42.5 04/18/2014   PLT 303.0 04/18/2014   GLUCOSE 96 04/18/2014   CHOL 190 10/25/2011   TRIG 50.0 10/25/2011   HDL 49.10 10/25/2011   LDLCALC 131* 10/25/2011   ALT 21 04/18/2014   AST 20 04/18/2014   NA 142 04/18/2014   K 4.6 04/18/2014   CL 105 04/18/2014   CREATININE 0.7 04/18/2014   BUN 12 04/18/2014   CO2 29 04/18/2014   TSH 0.68 12/03/2013   HGBA1C 6.1 12/03/2013   Wt Readings from Last 3 Encounters:  07/28/14 268 lb (121.564 kg)  04/18/14 262 lb (118.842 kg)  03/27/14 269 lb 9.6 oz (122.29 kg)   BP Readings from Last 3 Encounters:  07/28/14 126/80  04/18/14 144/80  03/27/14 122/82    ASSESSMENT AND PLAN:  Discussed the following assessment and plan:  Other fatigue - improved with lsi  so far  continue and see her at wellness in Jan17  Severe obesity (BMI >= 40)  Essential hypertension - controlled   Arthritis of knee  -Patient advised to return or notify health care team  if symptoms worsen ,persist or new concerns arise.  Patient Instructions  Continue lifestyle intervention healthy eating and exercise .  Continue   With heart healty diet.  Helps energy .   Plan wellness visit in January . 2017  Get flu vaccine in fall.        Standley Brooking. Panosh M.D.

## 2014-07-28 NOTE — Patient Instructions (Addendum)
Continue lifestyle intervention healthy eating and exercise .  Continue   With heart healty diet.  Helps energy .   Plan wellness visit in January . 2017  Get flu vaccine in fall.

## 2015-01-14 ENCOUNTER — Other Ambulatory Visit: Payer: Self-pay

## 2015-01-14 DIAGNOSIS — Z1231 Encounter for screening mammogram for malignant neoplasm of breast: Secondary | ICD-10-CM

## 2015-02-13 ENCOUNTER — Ambulatory Visit
Admission: RE | Admit: 2015-02-13 | Discharge: 2015-02-13 | Disposition: A | Discharge status: Home / Self Care | Payer: Medicare Other | Source: Ambulatory Visit

## 2015-02-13 DIAGNOSIS — Z1231 Encounter for screening mammogram for malignant neoplasm of breast: Secondary | ICD-10-CM

## 2015-05-08 ENCOUNTER — Encounter: Payer: Medicare Other | Admitting: Internal Medicine

## 2015-05-15 ENCOUNTER — Encounter: Payer: Self-pay | Admitting: Internal Medicine

## 2015-05-15 ENCOUNTER — Ambulatory Visit (INDEPENDENT_AMBULATORY_CARE_PROVIDER_SITE_OTHER): Payer: Medicare Other | Admitting: Internal Medicine

## 2015-05-15 VITALS — BP 114/80 | Temp 98.3°F | Ht 64.5 in | Wt 266.4 lb

## 2015-05-15 DIAGNOSIS — Z23 Encounter for immunization: Secondary | ICD-10-CM | POA: Diagnosis not present

## 2015-05-15 DIAGNOSIS — I10 Essential (primary) hypertension: Secondary | ICD-10-CM | POA: Diagnosis not present

## 2015-05-15 DIAGNOSIS — Z79899 Other long term (current) drug therapy: Secondary | ICD-10-CM

## 2015-05-15 DIAGNOSIS — Z1211 Encounter for screening for malignant neoplasm of colon: Secondary | ICD-10-CM

## 2015-05-15 DIAGNOSIS — R6889 Other general symptoms and signs: Secondary | ICD-10-CM

## 2015-05-15 DIAGNOSIS — Z0000 Encounter for general adult medical examination without abnormal findings: Secondary | ICD-10-CM | POA: Diagnosis not present

## 2015-05-15 DIAGNOSIS — Z0001 Encounter for general adult medical examination with abnormal findings: Secondary | ICD-10-CM

## 2015-05-15 DIAGNOSIS — R739 Hyperglycemia, unspecified: Secondary | ICD-10-CM | POA: Diagnosis not present

## 2015-05-15 LAB — CBC WITH DIFFERENTIAL/PLATELET
BASOS PCT: 0.4 % (ref 0.0–3.0)
Basophils Absolute: 0 10*3/uL (ref 0.0–0.1)
EOS ABS: 0.3 10*3/uL (ref 0.0–0.7)
Eosinophils Relative: 3.7 % (ref 0.0–5.0)
HEMATOCRIT: 42.1 % (ref 36.0–46.0)
Hemoglobin: 13.7 g/dL (ref 12.0–15.0)
Lymphocytes Relative: 29.9 % (ref 12.0–46.0)
Lymphs Abs: 2.3 10*3/uL (ref 0.7–4.0)
MCHC: 32.5 g/dL (ref 30.0–36.0)
MCV: 84.8 fl (ref 78.0–100.0)
MONO ABS: 0.7 10*3/uL (ref 0.1–1.0)
Monocytes Relative: 8.6 % (ref 3.0–12.0)
NEUTROS ABS: 4.4 10*3/uL (ref 1.4–7.7)
Neutrophils Relative %: 57.4 % (ref 43.0–77.0)
PLATELETS: 267 10*3/uL (ref 150.0–400.0)
RBC: 4.97 Mil/uL (ref 3.87–5.11)
RDW: 14.2 % (ref 11.5–15.5)
WBC: 7.7 10*3/uL (ref 4.0–10.5)

## 2015-05-15 LAB — HEPATIC FUNCTION PANEL
ALT: 15 U/L (ref 0–35)
AST: 19 U/L (ref 0–37)
Albumin: 4 g/dL (ref 3.5–5.2)
Alkaline Phosphatase: 115 U/L (ref 39–117)
BILIRUBIN DIRECT: 0.1 mg/dL (ref 0.0–0.3)
BILIRUBIN TOTAL: 0.3 mg/dL (ref 0.2–1.2)
TOTAL PROTEIN: 7.2 g/dL (ref 6.0–8.3)

## 2015-05-15 LAB — LIPID PANEL
CHOL/HDL RATIO: 5
CHOLESTEROL: 213 mg/dL — AB (ref 0–200)
HDL: 46.3 mg/dL (ref >39.00)
LDL Cholesterol: 155 mg/dL — ABNORMAL HIGH (ref 0–99)
NonHDL: 166.39
TRIGLYCERIDES: 55 mg/dL (ref 0.0–149.0)
VLDL: 11 mg/dL (ref 0.0–40.0)

## 2015-05-15 LAB — BASIC METABOLIC PANEL
BUN: 17 mg/dL (ref 6–23)
CHLORIDE: 102 meq/L (ref 96–112)
CO2: 29 meq/L (ref 19–32)
CREATININE: 0.75 mg/dL (ref 0.40–1.20)
Calcium: 10 mg/dL (ref 8.4–10.5)
GFR: 97.48 mL/min (ref >60.00)
Glucose, Bld: 102 mg/dL — ABNORMAL HIGH (ref 70–99)
Potassium: 4.7 mEq/L (ref 3.5–5.1)
Sodium: 140 mEq/L (ref 135–145)

## 2015-05-15 LAB — TSH: TSH: 1.4 u[IU]/mL (ref 0.35–4.50)

## 2015-05-15 LAB — HEMOGLOBIN A1C: Hgb A1c MFr Bld: 5.8 % (ref 4.6–6.5)

## 2015-05-15 MED ORDER — AMLODIPINE BESYLATE 10 MG PO TABS: 10.0000 mg | ORAL_TABLET | Freq: Every day | ORAL | Status: DC

## 2015-05-15 MED ORDER — LISINOPRIL-HYDROCHLOROTHIAZIDE 20-12.5 MG PO TABS: 1.0000 | ORAL_TABLET | Freq: Every day | ORAL | Status: DC

## 2015-05-15 NOTE — Progress Notes (Signed)
Pre visit review using our clinic review tool, if applicable. No additional management support is needed unless otherwise documented below in the visit note.  Chief Complaint  Patient presents with  . Medicare Wellness  . Hypertension    HPI: Nina Le 75 y.o. comes in today for Preventive Medicare wellness visit .  And Chronic disease management  HT  Doing okl no se of med reported  DJD stable getting around bp ok  In past .   Health Maintenance  Topic Date Due  . ZOSTAVAX  08/15/2002  . DEXA SCAN  08/15/2007  . TETANUS/TDAP  04/11/2014  . COLONOSCOPY  03/01/2015  . INFLUENZA VACCINE  11/10/2015  . PNA vac Low Risk Adult (2 of 2 - PPSV23) 05/14/2016  . MAMMOGRAM  02/12/2017   Health Maintenance Review LIFESTYLE:  TAD neg  Sugar beverages: sodas once a day at most . Water .  Not a sweets person but likes breads  Sleep: 6 hours    MEDICARE DOCUMENT QUESTIONS  TO SCAN   Hearing: ok  Vision:  No limitations at present . Last eye check due watery eyes ? Need new glasses   Safety:  Has smoke detector and wears seat belts.   No excess sun exposure. Falls: no  Advance directive :  Reviewed   Doesn't have one  Hand out given  Memory: Felt to be good  , no concern from her or her family.  Depression: No anhedonia unusual crying or depressive symptoms  Nutrition: Eats well balanced diet; adequate calcium and vitamin D. No swallowing chewing problems.  Injury: no major injuries in the last six months.  Other healthcare providers:  Reviewed today .  Social:  Lives with  Daughter  No pets.   Preventive parameters: up-to-date  Reviewed   ADLS:   There are no problems or need for assistance  driving, feeding, obtaining food, dressing, toileting and bathing, managing money using phone. She is independent.    ROS:  GEN/ HEENT: No fever, significant weight changes sweats headaches vision problems hearing changes, CV/ PULM; No chest pain shortness of breath cough,  syncope,edema  change in exercise tolerance. GI /GU: No adominal pain, vomiting, change in bowel habits. No blood in the stool. No significant GU symptoms. SKIN/HEME: ,no acute skin rashes suspicious lesions or bleeding. No lymphadenopathy, nodules, masses.  NEURO/ PSYCH:  No neurologic signs such as weakness numbness. No depression anxiety. IMM/ Allergy: No unusual infections.  Allergy .   REST of 12 system review negative except as per HPI   Past Medical History  Diagnosis Date  . Low back pain   . Diastolic dysfunction     Mild Echo  . Alkaline phosphatase elevation     hx-neg lfts  . Hyperlipidemia   . Hypertension   . Acute back pain 03/12/2011    Family History  Problem Relation Age of Onset  . Breast cancer Sister   . Arthritis Mother   . Hypertension Mother   . Breast cancer Mother     cancer in the blood  . Heart disease Father     Social History   Social History  . Marital Status: Married    Spouse Name: N/A  . Number of Children: N/A  . Years of Education: N/A   Social History Main Topics  . Smoking status: Never Smoker   . Smokeless tobacco: None  . Alcohol Use: No  . Drug Use: No  . Sexual Activity: Not Asked   Other  Topics Concern  . 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 amLODipine (NORVASC) 10 MG tablet Take 1 tablet (10 mg total) by mouth daily.  . Cholecalciferol (VITAMIN D-3 PO) Take 2,000 Units by mouth daily.  Marland Kitchen lisinopril-hydrochlorothiazide (PRINZIDE,ZESTORETIC) 20-12.5 MG tablet Take 1 tablet by mouth daily.  . Multiple Vitamin (MULTIVITAMIN) tablet Take 1 tablet by mouth daily.    . [DISCONTINUED] amLODipine (NORVASC) 10 MG tablet Take 1 tablet (10 mg total) by mouth daily.  . [DISCONTINUED]  lisinopril-hydrochlorothiazide (PRINZIDE,ZESTORETIC) 20-12.5 MG per tablet Take 1 tablet by mouth daily.   No facility-administered encounter medications on file as of 05/15/2015.    EXAM:  BP 114/80 mmHg  Temp(Src) 98.3 F (36.8 C) (Oral)  Ht 5' 4.5" (1.638 m)  Wt 266 lb 6.4 oz (120.838 kg)  BMI 45.04 kg/m2  Body mass index is 45.04 kg/(m^2).  Physical Exam: Vital signs reviewed XTG:GYIR is a well-developed well-nourished alert cooperative   who appears stated age in no acute distress.  HEENT: normocephalic atraumatic , Eyes: PERRL EOM's full, conjunctiva clear, Nares: paten,t no deformity discharge or tenderness., Ears: no deformity EAC's clear TMs with normal landmarks. Mouth: clear OP, no lesions, edema.  Moist mucous membranes. Dentition in adequate repair. NECK: supple without masses, thyromegaly or bruits. CHEST/PULM:  Clear to auscultation and percussion breath sounds equal no wheeze , rales or rhonchi. No chest wall deformities or tenderness.Breast: normal by inspection . No dimpling, discharge, masses, tenderness or discharge . CV: PMI is nondisplaced, S1 S2 no gallops, murmurs, rubs. Peripheral pulses are full without delay.No JVD .  ABDOMEN: Bowel sounds normal nontender  No guard or rebound, no hepato splenomegal no CVA tenderness.   Extremtities:  No clubbing cyanosis or edema, no acute joint swelling or redness no focal atrophy NEURO:  Oriented x3, cranial nerves 3-12 appear to be intact, no obvious focal weakness,gait within normal limits independent   Can get up on table alone SKIN: No acute rashes normal turgor, color, no bruising or petechiae. PSYCH: Oriented, good eye contact, no obvious depression anxiety, cognition and judgment appear normal. LN: no cervical axillary inguinal adenopathy No noted deficits in memory, attention, and speech.    Wt Readings from Last 3 Encounters:  05/15/15 266 lb 6.4 oz (120.838 kg)  07/28/14 268 lb (121.564 kg)  04/18/14 262 lb  (118.842 kg)   BP Readings from Last 3 Encounters:  05/15/15 114/80  07/28/14 126/80  04/18/14 144/80    ASSESSMENT AND PLAN:  Discussed the following assessment and plan:  Visit for preventive health examination - Plan: Basic metabolic panel, CBC with Differential/Platelet, Hepatic function panel, Lipid panel, TSH  Medicare annual wellness visit, subsequent  Essential hypertension  Hyperglycemia - Plan: Hemoglobin A1c  Medication management  Colon cancer screening - Plan: Ambulatory referral to Gastroenterology  Need for prophylactic vaccination and inoculation against influenza - Plan: Flu vaccine HIGH DOSE PF (Fluzone High dose)  Need for vaccination with 13-polyvalent pneumococcal conjugate vaccine - Plan: Pneumococcal conjugate vaccine 13-valent IM  Morbid obesity, unspecified obesity type [facility])  Patient Care Team: Burnis Medin, MD as PCP - General Marybelle Killings, MD (Orthopedic Surgery)  Patient Instructions  Stay active  Avoid breads simple carbs and sugar drinks. Check out written advance directives for HCPOA.  Will notify you  of labs when available. If doing well    And labs are normal Yearly check up with labs     Health Maintenance, Female Adopting a healthy lifestyle and getting preventive care can go a long way to promote health and wellness. Talk with your health care provider about what schedule of regular examinations is right for you. This is a good chance for you to check in with your provider about disease prevention and staying healthy. In between checkups, there are plenty of things you can do on your own. Experts have done a lot of research about which lifestyle changes and preventive measures are most likely to keep you healthy. Ask your health care provider for more information. WEIGHT AND DIET  Eat a healthy diet  Be sure to include plenty of vegetables, fruits, low-fat dairy products, and lean protein.  Do not eat a lot of foods high in  solid fats, added sugars, or salt.  Get regular exercise. This is one of the most important things you can do for your health.  Most adults should exercise for at least 150 minutes each week. The exercise should increase your heart rate and make you sweat (moderate-intensity exercise).  Most adults should also do strengthening exercises at least twice a week. This is in addition to the moderate-intensity exercise.  Maintain a healthy weight  Body mass index (BMI) is a measurement that can be used to identify possible weight problems. It estimates body fat based on height and weight. Your health care provider can help determine your BMI and help you achieve or maintain a healthy weight.  For females 23 years of age and older:   A BMI below 18.5 is considered underweight.  A BMI of 18.5 to 24.9 is normal.  A BMI of 25 to 29.9 is considered overweight.  A BMI of 30 and above is considered obese.  Watch levels of cholesterol and blood lipids  You should start having your blood tested for lipids and cholesterol at 73 years of age, then have this test every 5 years.  You may need to have your cholesterol levels checked more often if:  Your lipid or cholesterol levels are high.  You are older than 73 years of age.  You are at high risk for heart disease.  CANCER SCREENING   Lung Cancer  Lung cancer screening is recommended for adults 58-9 years old who are at high risk for lung cancer because of a history of smoking.  A yearly low-dose CT scan of the lungs is recommended for people who:  Currently smoke.  Have quit within the past 15 years.  Have at least a 30-pack-year history of smoking. A pack year is smoking an average of one pack of cigarettes a day for 1 year.  Yearly screening should continue until it has been 15 years since you quit.  Yearly screening should stop if you develop a health problem that would prevent you from having lung cancer treatment.  Breast  Cancer  Practice breast self-awareness. This means understanding how your breasts normally appear and feel.  It also means doing regular breast self-exams. Let your health care provider know about any changes, no matter how small.  If you are in your 20s or 30s, you should have a clinical breast exam (CBE) by a health care provider every 1-3 years as  part of a regular health exam.  If you are 40 or older, have a CBE every year. Also consider having a breast X-ray (mammogram) every year.  If you have a family history of breast cancer, talk to your health care provider about genetic screening.  If you are at high risk for breast cancer, talk to your health care provider about having an MRI and a mammogram every year.  Breast cancer gene (BRCA) assessment is recommended for women who have family members with BRCA-related cancers. BRCA-related cancers include:  Breast.  Ovarian.  Tubal.  Peritoneal cancers.  Results of the assessment will determine the need for genetic counseling and BRCA1 and BRCA2 testing. Cervical Cancer Your health care provider may recommend that you be screened regularly for cancer of the pelvic organs (ovaries, uterus, and vagina). This screening involves a pelvic examination, including checking for microscopic changes to the surface of your cervix (Pap test). You may be encouraged to have this screening done every 3 years, beginning at age 59.  For women ages 56-65, health care providers may recommend pelvic exams and Pap testing every 3 years, or they may recommend the Pap and pelvic exam, combined with testing for human papilloma virus (HPV), every 5 years. Some types of HPV increase your risk of cervical cancer. Testing for HPV may also be done on women of any age with unclear Pap test results.  Other health care providers may not recommend any screening for nonpregnant women who are considered low risk for pelvic cancer and who do not have symptoms. Ask your  health care provider if a screening pelvic exam is right for you.  If you have had past treatment for cervical cancer or a condition that could lead to cancer, you need Pap tests and screening for cancer for at least 20 years after your treatment. If Pap tests have been discontinued, your risk factors (such as having a new sexual partner) need to be reassessed to determine if screening should resume. Some women have medical problems that increase the chance of getting cervical cancer. In these cases, your health care provider may recommend more frequent screening and Pap tests. Colorectal Cancer  This type of cancer can be detected and often prevented.  Routine colorectal cancer screening usually begins at 73 years of age and continues through 73 years of age.  Your health care provider may recommend screening at an earlier age if you have risk factors for colon cancer.  Your health care provider may also recommend using home test kits to check for hidden blood in the stool.  A small camera at the end of a tube can be used to examine your colon directly (sigmoidoscopy or colonoscopy). This is done to check for the earliest forms of colorectal cancer.  Routine screening usually begins at age 59.  Direct examination of the colon should be repeated every 5-10 years through 73 years of age. However, you may need to be screened more often if early forms of precancerous polyps or small growths are found. Skin Cancer  Check your skin from head to toe regularly.  Tell your health care provider about any new moles or changes in moles, especially if there is a change in a mole's shape or color.  Also tell your health care provider if you have a mole that is larger than the size of a pencil eraser.  Always use sunscreen. Apply sunscreen liberally and repeatedly throughout the day.  Protect yourself by wearing long sleeves, pants, a  wide-brimmed hat, and sunglasses whenever you are outside. HEART  DISEASE, DIABETES, AND HIGH BLOOD PRESSURE   High blood pressure causes heart disease and increases the risk of stroke. High blood pressure is more likely to develop in:  People who have blood pressure in the high end of the normal range (130-139/85-89 mm Hg).  People who are overweight or obese.  People who are African American.  If you are 45-73 years of age, have your blood pressure checked every 3-5 years. If you are 14 years of age or older, have your blood pressure checked every year. You should have your blood pressure measured twice--once when you are at a hospital or clinic, and once when you are not at a hospital or clinic. Record the average of the two measurements. To check your blood pressure when you are not at a hospital or clinic, you can use:  An automated blood pressure machine at a pharmacy.  A home blood pressure monitor.  If you are between 37 years and 66 years old, ask your health care provider if you should take aspirin to prevent strokes.  Have regular diabetes screenings. This involves taking a blood sample to check your fasting blood sugar level.  If you are at a normal weight and have a low risk for diabetes, have this test once every three years after 73 years of age.  If you are overweight and have a high risk for diabetes, consider being tested at a younger age or more often. PREVENTING INFECTION  Hepatitis B  If you have a higher risk for hepatitis B, you should be screened for this virus. You are considered at high risk for hepatitis B if:  You were born in a country where hepatitis B is common. Ask your health care provider which countries are considered high risk.  Your parents were born in a high-risk country, and you have not been immunized against hepatitis B (hepatitis B vaccine).  You have HIV or AIDS.  You use needles to inject street drugs.  You live with someone who has hepatitis B.  You have had sex with someone who has hepatitis  B.  You get hemodialysis treatment.  You take certain medicines for conditions, including cancer, organ transplantation, and autoimmune conditions. Hepatitis C  Blood testing is recommended for:  Everyone born from [birth year] through 1963.  Anyone with known risk factors for hepatitis C. Sexually transmitted infections (STIs)  You should be screened for sexually transmitted infections (STIs) including gonorrhea and chlamydia if:  You are sexually active and are younger than 73 years of age.  You are older than 73 years of age and your health care provider tells you that you are at risk for this type of infection.  Your sexual activity has changed since you were last screened and you are at an increased risk for chlamydia or gonorrhea. Ask your health care provider if you are at risk.  If you do not have HIV, but are at risk, it may be recommended that you take a prescription medicine daily to prevent HIV infection. This is called pre-exposure prophylaxis (PrEP). You are considered at risk if:  You are sexually active and do not regularly use condoms or know the HIV status of your partner(s).  You take drugs by injection.  You are sexually active with a partner who has HIV. Talk with your health care provider about whether you are at high risk of being infected with HIV. If you choose to begin PrEP,  you should first be tested for HIV. You should then be tested every 3 months for as long as you are taking PrEP.  PREGNANCY   If you are premenopausal and you may become pregnant, ask your health care provider about preconception counseling.  If you may become pregnant, take 400 to 800 micrograms (mcg) of folic acid every day.  If you want to prevent pregnancy, talk to your health care provider about birth control (contraception). OSTEOPOROSIS AND MENOPAUSE   Osteoporosis is a disease in which the bones lose minerals and strength with aging. This can result in serious bone fractures. Your  risk for osteoporosis can be identified using a bone density scan.  If you are 76 years of age or older, or if you are at risk for osteoporosis and fractures, ask your health care provider if you should be screened.  Ask your health care provider whether you should take a calcium or vitamin D supplement to lower your risk for osteoporosis.  Menopause may have certain physical symptoms and risks.  Hormone replacement therapy may reduce some of these symptoms and risks. Talk to your health care provider about whether hormone replacement therapy is right for you.  HOME CARE INSTRUCTIONS   Schedule regular health, dental, and eye exams.  Stay current with your immunizations.   Do not use any tobacco products including cigarettes, chewing tobacco, or electronic cigarettes.  If you are pregnant, do not drink alcohol.  If you are breastfeeding, limit how much and how often you drink alcohol.  Limit alcohol intake to no more than 1 drink per day for nonpregnant women. One drink equals 12 ounces of beer, 5 ounces of wine, or 1 ounces of hard liquor.  Do not use street drugs.  Do not share needles.  Ask your health care provider for help if you need support or information about quitting drugs.  Tell your health care provider if you often feel depressed.  Tell your health care provider if you have ever been abused or do not feel safe at home.   This information is not intended to replace advice given to you by your health care provider. Make sure you discuss any questions you have with your health care provider.   Document Released: 10/11/2010 Document Revised: 04/18/2014 Document Reviewed: 02/27/2013 Elsevier Interactive Patient Education 2016 [place] K. Taleshia Luff M.D.   Lab Results  Component Value Date   WBC 7.7 05/15/2015   HGB 13.7 05/15/2015   HCT 42.1 05/15/2015   PLT 267.0 05/15/2015   GLUCOSE 102* 05/15/2015   CHOL 213* 05/15/2015   TRIG 55.0  05/15/2015   HDL 46.30 05/15/2015   LDLCALC 155* 05/15/2015   ALT 15 05/15/2015   AST 19 05/15/2015   NA 140 05/15/2015   K 4.7 05/15/2015   CL 102 05/15/2015   CREATININE 0.75 05/15/2015   BUN 17 05/15/2015   CO2 29 05/15/2015   TSH 1.40 05/15/2015   HGBA1C 5.8 05/15/2015

## 2015-05-15 NOTE — Patient Instructions (Addendum)
Stay active  Avoid breads simple carbs and sugar drinks. Check out written advance directives for HCPOA.  Will notify you  of labs when available. If doing well    And labs are normal Yearly check up with labs     Health Maintenance, Female Adopting a healthy lifestyle and getting preventive care can go a long way to promote health and wellness. Talk with your health care provider about what schedule of regular examinations is right for you. This is a good chance for you to check in with your provider about disease prevention and staying healthy. In between checkups, there are plenty of things you can do on your own. Experts have done a lot of research about which lifestyle changes and preventive measures are most likely to keep you healthy. Ask your health care provider for more information. WEIGHT AND DIET  Eat a healthy diet  Be sure to include plenty of vegetables, fruits, low-fat dairy products, and lean protein.  Do not eat a lot of foods high in solid fats, added sugars, or salt.  Get regular exercise. This is one of the most important things you can do for your health.  Most adults should exercise for at least 150 minutes each week. The exercise should increase your heart rate and make you sweat (moderate-intensity exercise).  Most adults should also do strengthening exercises at least twice a week. This is in addition to the moderate-intensity exercise.  Maintain a healthy weight  Body mass index (BMI) is a measurement that can be used to identify possible weight problems. It estimates body fat based on height and weight. Your health care provider can help determine your BMI and help you achieve or maintain a healthy weight.  For females 33 years of age and older:   A BMI below 18.5 is considered underweight.  A BMI of 18.5 to 24.9 is normal.  A BMI of 25 to 29.9 is considered overweight.  A BMI of 30 and above is considered obese.  Watch levels of cholesterol and blood  lipids  You should start having your blood tested for lipids and cholesterol at 73 years of age, then have this test every 5 years.  You may need to have your cholesterol levels checked more often if:  Your lipid or cholesterol levels are high.  You are older than 73 years of age.  You are at high risk for heart disease.  CANCER SCREENING   Lung Cancer  Lung cancer screening is recommended for adults 75-21 years old who are at high risk for lung cancer because of a history of smoking.  A yearly low-dose CT scan of the lungs is recommended for people who:  Currently smoke.  Have quit within the past 15 years.  Have at least a 30-pack-year history of smoking. A pack year is smoking an average of one pack of cigarettes a day for 1 year.  Yearly screening should continue until it has been 15 years since you quit.  Yearly screening should stop if you develop a health problem that would prevent you from having lung cancer treatment.  Breast Cancer  Practice breast self-awareness. This means understanding how your breasts normally appear and feel.  It also means doing regular breast self-exams. Let your health care provider know about any changes, no matter how small.  If you are in your 20s or 30s, you should have a clinical breast exam (CBE) by a health care provider every 1-3 years as part of a regular  health exam.  If you are 40 or older, have a CBE every year. Also consider having a breast X-ray (mammogram) every year.  If you have a family history of breast cancer, talk to your health care provider about genetic screening.  If you are at high risk for breast cancer, talk to your health care provider about having an MRI and a mammogram every year.  Breast cancer gene (BRCA) assessment is recommended for women who have family members with BRCA-related cancers. BRCA-related cancers include:  Breast.  Ovarian.  Tubal.  Peritoneal cancers.  Results of the assessment  will determine the need for genetic counseling and BRCA1 and BRCA2 testing. Cervical Cancer Your health care provider may recommend that you be screened regularly for cancer of the pelvic organs (ovaries, uterus, and vagina). This screening involves a pelvic examination, including checking for microscopic changes to the surface of your cervix (Pap test). You may be encouraged to have this screening done every 3 years, beginning at age 13.  For women ages 66-65, health care providers may recommend pelvic exams and Pap testing every 3 years, or they may recommend the Pap and pelvic exam, combined with testing for human papilloma virus (HPV), every 5 years. Some types of HPV increase your risk of cervical cancer. Testing for HPV may also be done on women of any age with unclear Pap test results.  Other health care providers may not recommend any screening for nonpregnant women who are considered low risk for pelvic cancer and who do not have symptoms. Ask your health care provider if a screening pelvic exam is right for you.  If you have had past treatment for cervical cancer or a condition that could lead to cancer, you need Pap tests and screening for cancer for at least 20 years after your treatment. If Pap tests have been discontinued, your risk factors (such as having a new sexual partner) need to be reassessed to determine if screening should resume. Some women have medical problems that increase the chance of getting cervical cancer. In these cases, your health care provider may recommend more frequent screening and Pap tests. Colorectal Cancer  This type of cancer can be detected and often prevented.  Routine colorectal cancer screening usually begins at 73 years of age and continues through 73 years of age.  Your health care provider may recommend screening at an earlier age if you have risk factors for colon cancer.  Your health care provider may also recommend using home test kits to check  for hidden blood in the stool.  A small camera at the end of a tube can be used to examine your colon directly (sigmoidoscopy or colonoscopy). This is done to check for the earliest forms of colorectal cancer.  Routine screening usually begins at age 72.  Direct examination of the colon should be repeated every 5-10 years through 73 years of age. However, you may need to be screened more often if early forms of precancerous polyps or small growths are found. Skin Cancer  Check your skin from head to toe regularly.  Tell your health care provider about any new moles or changes in moles, especially if there is a change in a mole's shape or color.  Also tell your health care provider if you have a mole that is larger than the size of a pencil eraser.  Always use sunscreen. Apply sunscreen liberally and repeatedly throughout the day.  Protect yourself by wearing long sleeves, pants, a wide-brimmed hat, and  sunglasses whenever you are outside. HEART DISEASE, DIABETES, AND HIGH BLOOD PRESSURE   High blood pressure causes heart disease and increases the risk of stroke. High blood pressure is more likely to develop in:  People who have blood pressure in the high end of the normal range (130-139/85-89 mm Hg).  People who are overweight or obese.  People who are African American.  If you are 13-40 years of age, have your blood pressure checked every 3-5 years. If you are 65 years of age or older, have your blood pressure checked every year. You should have your blood pressure measured twice--once when you are at a hospital or clinic, and once when you are not at a hospital or clinic. Record the average of the two measurements. To check your blood pressure when you are not at a hospital or clinic, you can use:  An automated blood pressure machine at a pharmacy.  A home blood pressure monitor.  If you are between 55 years and 85 years old, ask your health care provider if you should take  aspirin to prevent strokes.  Have regular diabetes screenings. This involves taking a blood sample to check your fasting blood sugar level.  If you are at a normal weight and have a low risk for diabetes, have this test once every three years after 73 years of age.  If you are overweight and have a high risk for diabetes, consider being tested at a younger age or more often. PREVENTING INFECTION  Hepatitis B  If you have a higher risk for hepatitis B, you should be screened for this virus. You are considered at high risk for hepatitis B if:  You were born in a country where hepatitis B is common. Ask your health care provider which countries are considered high risk.  Your parents were born in a high-risk country, and you have not been immunized against hepatitis B (hepatitis B vaccine).  You have HIV or AIDS.  You use needles to inject street drugs.  You live with someone who has hepatitis B.  You have had sex with someone who has hepatitis B.  You get hemodialysis treatment.  You take certain medicines for conditions, including cancer, organ transplantation, and autoimmune conditions. Hepatitis C  Blood testing is recommended for:  Everyone born from [birth year] through 1963.  Anyone with known risk factors for hepatitis C. Sexually transmitted infections (STIs)  You should be screened for sexually transmitted infections (STIs) including gonorrhea and chlamydia if:  You are sexually active and are younger than 73 years of age.  You are older than 73 years of age and your health care provider tells you that you are at risk for this type of infection.  Your sexual activity has changed since you were last screened and you are at an increased risk for chlamydia or gonorrhea. Ask your health care provider if you are at risk.  If you do not have HIV, but are at risk, it may be recommended that you take a prescription medicine daily to prevent HIV infection. This is called  pre-exposure prophylaxis (PrEP). You are considered at risk if:  You are sexually active and do not regularly use condoms or know the HIV status of your partner(s).  You take drugs by injection.  You are sexually active with a partner who has HIV. Talk with your health care provider about whether you are at high risk of being infected with HIV. If you choose to begin PrEP, you should first  be tested for HIV. You should then be tested every 3 months for as long as you are taking PrEP.  PREGNANCY   If you are premenopausal and you may become pregnant, ask your health care provider about preconception counseling.  If you may become pregnant, take 400 to 800 micrograms (mcg) of folic acid every day.  If you want to prevent pregnancy, talk to your health care provider about birth control (contraception). OSTEOPOROSIS AND MENOPAUSE   Osteoporosis is a disease in which the bones lose minerals and strength with aging. This can result in serious bone fractures. Your risk for osteoporosis can be identified using a bone density scan.  If you are 42 years of age or older, or if you are at risk for osteoporosis and fractures, ask your health care provider if you should be screened.  Ask your health care provider whether you should take a calcium or vitamin D supplement to lower your risk for osteoporosis.  Menopause may have certain physical symptoms and risks.  Hormone replacement therapy may reduce some of these symptoms and risks. Talk to your health care provider about whether hormone replacement therapy is right for you.  HOME CARE INSTRUCTIONS   Schedule regular health, dental, and eye exams.  Stay current with your immunizations.   Do not use any tobacco products including cigarettes, chewing tobacco, or electronic cigarettes.  If you are pregnant, do not drink alcohol.  If you are breastfeeding, limit how much and how often you drink alcohol.  Limit alcohol intake to no more than 1  drink per day for nonpregnant women. One drink equals 12 ounces of beer, 5 ounces of wine, or 1 ounces of hard liquor.  Do not use street drugs.  Do not share needles.  Ask your health care provider for help if you need support or information about quitting drugs.  Tell your health care provider if you often feel depressed.  Tell your health care provider if you have ever been abused or do not feel safe at home.   This information is not intended to replace advice given to you by your health care provider. Make sure you discuss any questions you have with your health care provider.   Document Released: 10/11/2010 Document Revised: 04/18/2014 Document Reviewed: 02/27/2013 Elsevier Interactive Patient Education [employer].

## 2015-05-21 ENCOUNTER — Encounter: Payer: Self-pay | Admitting: Internal Medicine

## 2015-05-21 ENCOUNTER — Telehealth: Payer: Self-pay | Admitting: Internal Medicine

## 2015-05-21 NOTE — Telephone Encounter (Signed)
Opened in error, [facility] called pt

## 2015-07-08 ENCOUNTER — Ambulatory Visit (AMBULATORY_SURGERY_CENTER): Payer: Self-pay | Admitting: *Deleted

## 2015-07-08 VITALS — Ht 65.0 in | Wt 265.2 lb

## 2015-07-08 DIAGNOSIS — Z1211 Encounter for screening for malignant neoplasm of colon: Secondary | ICD-10-CM

## 2015-07-08 NOTE — Progress Notes (Signed)
Denies allergies to eggs or soy products. Denies complications with sedation or anesthesia. Denies O2 use. Denies use of diet or weight loss medications.  Emmi instructions given for colonoscopy.  

## 2015-07-22 ENCOUNTER — Ambulatory Visit (AMBULATORY_SURGERY_CENTER): Payer: Medicare Other | Admitting: Internal Medicine

## 2015-07-22 ENCOUNTER — Encounter: Payer: Self-pay | Admitting: Internal Medicine

## 2015-07-22 VITALS — BP 118/56 | HR 79 | Temp 98.2°F | Resp 17 | Ht 65.0 in | Wt 265.0 lb

## 2015-07-22 DIAGNOSIS — I10 Essential (primary) hypertension: Secondary | ICD-10-CM | POA: Diagnosis not present

## 2015-07-22 DIAGNOSIS — E669 Obesity, unspecified: Secondary | ICD-10-CM | POA: Diagnosis not present

## 2015-07-22 DIAGNOSIS — Z1211 Encounter for screening for malignant neoplasm of colon: Secondary | ICD-10-CM

## 2015-07-22 MED ORDER — SODIUM CHLORIDE 0.9 % IV SOLN: 500.0000 mL | INTRAVENOUS | Status: DC

## 2015-07-22 NOTE — Patient Instructions (Addendum)
No polyps or cancer!  I do not recommend any further colonoscopy or colon cancer screening tests.  I appreciate the opportunity to care for you. Gatha Mayer, MD, FACG    YOU HAD AN ENDOSCOPIC PROCEDURE TODAY AT [facility]:   Refer to the procedure report that was given to you for any specific questions about what was found during the examination.  If the procedure report does not answer your questions, please call your gastroenterologist to clarify.  If you requested that your care partner not be given the details of your procedure findings, then the procedure report has been included in a sealed envelope for you to review at your convenience later.  YOU SHOULD EXPECT: Some feelings of bloating in the abdomen. Passage of more gas than usual.  Walking can help get rid of the air that was put into your GI tract during the procedure and reduce the bloating. If you had a lower endoscopy (such as a colonoscopy or flexible sigmoidoscopy) you may notice spotting of blood in your stool or on the toilet paper. If you underwent a bowel prep for your procedure, you may not have a normal bowel movement for a few days.  Please Note:  You might notice some irritation and congestion in your nose or some drainage.  This is from the oxygen used during your procedure.  There is no need for concern and it should clear up in a day or so.  SYMPTOMS TO REPORT IMMEDIATELY:   Following lower endoscopy (colonoscopy or flexible sigmoidoscopy):  Excessive amounts of blood in the stool  Significant tenderness or worsening of abdominal pains  Swelling of the abdomen that is new, acute  Fever of 100F or higher   For urgent or emergent issues, a gastroenterologist can be reached at any hour by calling [phone].   DIET: Your first meal following the procedure should be a small meal and then it is ok to progress to your normal diet. Heavy or fried foods are harder to digest and may  make you feel nauseous or bloated.  Likewise, meals heavy in dairy and vegetables can increase bloating.  Drink plenty of fluids but you should avoid alcoholic beverages for 24 hours.  ACTIVITY:  You should plan to take it easy for the rest of today and you should NOT DRIVE or use heavy machinery until tomorrow (because of the sedation medicines used during the test).    FOLLOW UP: Our staff will call the number listed on your records the next business day following your procedure to check on you and address any questions or concerns that you may have regarding the information given to you following your procedure. If we do not reach you, we will leave a message.  However, if you are feeling well and you are not experiencing any problems, there is no need to return our call.  We will assume that you have returned to your regular daily activities without incident.  If any biopsies were taken you will be contacted by phone or by letter within the next 1-3 weeks.  Please call us at [phone] if you have not heard about the biopsies in 3 weeks.    SIGNATURES/CONFIDENTIALITY: You and/or your care partner have signed paperwork which will be entered into your electronic medical record.  These signatures attest to the fact that that the information above on your After Visit Summary has been reviewed and is understood.  Full responsibility of the confidentiality  of this discharge information lies with you and/or your care-partner.    Handout was given to your care partner on diverticulosis. You may resume your current medications today. Please call if any questions or concerns.

## 2015-07-22 NOTE — Progress Notes (Signed)
No problems noted in the recovery room. maw 

## 2015-07-22 NOTE — Op Note (Signed)
[facility] Patient Name: Nina Le Procedure Date: 07/22/2015 8:52 AM MRN: [id number] Endoscopist: Gatha Mayer , MD Age: 75 Date of Birth: 1940/06/19 Gender: Female Procedure:                Colonoscopy Indications:              Screening for colorectal malignant neoplasm Medicines:                Propofol per Anesthesia, Monitored Anesthesia Care Procedure:                Pre-Anesthesia Assessment:                           - Prior to the procedure, a History and Physical                            was performed, and patient medications and                            allergies were reviewed. The patient's tolerance of                            previous anesthesia was also reviewed. The risks                            and benefits of the procedure and the sedation                            options and risks were discussed with the patient.                            All questions were answered, and informed consent                            was obtained. Prior Anticoagulants: The patient has                            taken no previous anticoagulant or antiplatelet                            agents. ASA Grade Assessment: III - A patient with                            severe systemic disease. After reviewing the risks                            and benefits, the patient was deemed in                            satisfactory condition to undergo the procedure.                           After obtaining informed consent, the colonoscope  was passed under direct vision. Throughout the                            procedure, the patient's blood pressure, pulse, and                            oxygen saturations were monitored continuously. The                            Model PCF-[id number]) scope was introduced                            through the anus and advanced to the the cecum,                            identified by appendiceal  orifice and ileocecal                            valve. The colonoscopy was performed without                            difficulty. The patient tolerated the procedure                            well. The quality of the bowel preparation was                            excellent. The bowel preparation used was Miralax.                            The ileocecal valve, appendiceal orifice, and                            rectum were photographed. Scope In: 9:08:45 AM Scope Out: 9:18:37 AM Scope Withdrawal Time: 0 hours 8 minutes 2 seconds  Total Procedure Duration: 0 hours 9 minutes 52 seconds  Findings:                 The perianal and digital rectal examinations were                            normal.                           Many small and large-mouthed diverticula were found                            in the left colon. There was no evidence of                            diverticular bleeding.                           The exam was otherwise without abnormality on  direct and retroflexion views. Complications:            No immediate complications. Estimated Blood Loss:     Estimated blood loss: none. Impression:               - Severe diverticulosis in the left colon. There                            was no evidence of diverticular bleeding.                           - The examination was otherwise normal on direct                            and retroflexion views.                           - No specimens collected. Recommendation:           - Patient has a contact number available for                            emergencies. The signs and symptoms of potential                            delayed complications were discussed with the                            patient. Return to normal activities tomorrow.                            Written discharge instructions were provided to the                            patient.                           - Resume  previous diet.                           - Continue present medications.                           - No repeat colonoscopy due to age. Gatha Mayer, MD 07/22/2015 9:24:01 AM This report has been signed electronically. CC Letter to:             Shanon Ace, MD

## 2015-07-22 NOTE — Progress Notes (Signed)
Report given to PACU RN, vss 

## 2015-07-23 ENCOUNTER — Telehealth: Payer: Self-pay

## 2015-07-23 NOTE — Telephone Encounter (Signed)
  Follow up Call-  Call back number 07/22/2015  Post procedure Call Back phone  # [phone]  Permission to leave phone message Yes     Patient questions:  Do you have a fever, pain , or abdominal swelling? No. Pain Score  0 *  Have you tolerated food without any problems? Yes.    Have you been able to return to your normal activities? Yes.    Do you have any questions about your discharge instructions: Diet   No. Medications  No. Follow up visit  No.  Do you have questions or concerns about your Care? No.  Actions: * If pain score is 4 or above: No action needed, pain <4.

## 2015-12-17 DIAGNOSIS — H04123 Dry eye syndrome of bilateral lacrimal glands: Secondary | ICD-10-CM | POA: Diagnosis not present

## 2015-12-17 DIAGNOSIS — H2513 Age-related nuclear cataract, bilateral: Secondary | ICD-10-CM | POA: Diagnosis not present

## 2016-01-13 ENCOUNTER — Other Ambulatory Visit: Payer: Self-pay | Admitting: Internal Medicine

## 2016-01-13 DIAGNOSIS — Z1231 Encounter for screening mammogram for malignant neoplasm of breast: Secondary | ICD-10-CM

## 2016-02-15 ENCOUNTER — Ambulatory Visit (INDEPENDENT_AMBULATORY_CARE_PROVIDER_SITE_OTHER): Payer: Medicare Other | Admitting: Family Medicine

## 2016-02-15 ENCOUNTER — Ambulatory Visit
Admission: RE | Admit: 2016-02-15 | Discharge: 2016-02-15 | Disposition: A | Discharge status: Home / Self Care | Payer: Medicare Other | Source: Ambulatory Visit | Attending: Internal Medicine | Admitting: Internal Medicine

## 2016-02-15 DIAGNOSIS — Z23 Encounter for immunization: Secondary | ICD-10-CM | POA: Diagnosis not present

## 2016-02-15 DIAGNOSIS — Z1231 Encounter for screening mammogram for malignant neoplasm of breast: Secondary | ICD-10-CM | POA: Diagnosis not present

## 2016-02-18 ENCOUNTER — Ambulatory Visit (INDEPENDENT_AMBULATORY_CARE_PROVIDER_SITE_OTHER): Payer: Medicare Other | Admitting: Family Medicine

## 2016-02-18 ENCOUNTER — Encounter: Payer: Self-pay | Admitting: Family Medicine

## 2016-02-18 ENCOUNTER — Telehealth: Payer: Self-pay | Admitting: Internal Medicine

## 2016-02-18 VITALS — BP 108/80 | HR 92 | Temp 99.3°F | Ht 65.0 in | Wt 267.5 lb

## 2016-02-18 DIAGNOSIS — B9789 Other viral agents as the cause of diseases classified elsewhere: Secondary | ICD-10-CM | POA: Diagnosis not present

## 2016-02-18 DIAGNOSIS — J069 Acute upper respiratory infection, unspecified: Secondary | ICD-10-CM

## 2016-02-18 LAB — POCT INFLUENZA A/B: INFLUENZA A, POC: NEGATIVE

## 2016-02-18 MED ORDER — BENZONATATE 100 MG PO CAPS: 100.0000 mg | ORAL_CAPSULE | Freq: Two times a day (BID) | ORAL | 0 refills | Status: DC | PRN

## 2016-02-18 NOTE — Patient Instructions (Addendum)
INSTRUCTIONS FOR UPPER RESPIRATORY INFECTION:  -plenty of rest and fluids  -nasal saline wash 2-3 times daily (use prepackaged nasal saline or bottled/distilled water if making your own)   -can use AFRIN nasal spray for drainage and nasal congestion - but do NOT use longer then 3-4 days  -can use tylenol (in no history of liver disease) or ibuprofen (if no history of kidney disease, bowel bleeding or significant heart disease) as directed for aches and sorethroat  -in the winter time, using a humidifier at night is helpful (please follow cleaning instructions)  -if you are taking a cough medication - use only as directed, may also try a teaspoon of honey to coat the throat and throat lozenges. I sent Tessalon cough medication to your pharmacy to use as instructed as needed.  -for sore throat, salt water gargles can help  -follow up if you have fevers, facial pain, tooth pain, difficulty breathing or are worsening or symptoms persist longer then expected  Upper Respiratory Infection, Adult An upper respiratory infection (URI) is also known as the common cold. It is often caused by a type of germ (virus). Colds are easily spread (contagious). You can pass it to others by kissing, coughing, sneezing, or drinking out of the same glass. Usually, you get better in 1 to 3  weeks.  However, the cough can last for even longer. HOME CARE   Only take medicine as told by your doctor. Follow instructions provided above.  Drink enough water and fluids to keep your pee (urine) clear or pale yellow.  Get plenty of rest.  Return to work when your temperature is < 100 for 24 hours or as told by your doctor. You may use a face mask and wash your hands to stop your cold from spreading. GET HELP RIGHT AWAY IF:   After the first few days, you feel you are getting worse.  You have questions about your medicine.  You have chills, shortness of breath, or red spit (mucus).  You have pain in the face for  more then 1-2 days, especially when you bend forward.  You have a fever, puffy (swollen) neck, pain when you swallow, or white spots in the back of your throat.  You have a bad headache, ear pain, sinus pain, or chest pain.  You have a high-pitched whistling sound when you breathe in and out (wheezing).  You cough up blood.  You have sore muscles or a stiff neck. MAKE SURE YOU:   Understand these instructions.  Will watch your condition.  Will get help right away if you are not doing well or get worse. Document Released: 09/14/2007 Document Revised: 06/20/2011 Document Reviewed: 07/03/2013 [facility] Patient Information 2015 [place], [place]. This information is not intended to replace advice given to you by your health care provider. Make sure you discuss any questions you have with your health care provider.

## 2016-02-18 NOTE — Telephone Encounter (Signed)
Patient Name: Nina Le  DOB: [birth year]    Initial Comment Caller had a the flu shot on Monday and is having cold s/s. congestion, cough and nasal drainage    Nurse Assessment  Nurse: Raphael Gibney, RN, Vanita Ingles Date/Time (Eastern Time): 02/18/2016 10:49:22 AM  Confirm and document reason for call. If symptomatic, describe symptoms. You must click the next button to save text entered. ---Caller states she had flu vaccine on Monday. She has sinus congestion. Having a lot of coughing. Coughing up light green mucus. Had chills last night. She coughed all night. Symptoms started on Tuesday night. Has taken Robitussin.  Has the patient traveled out of the country within the last 30 days? ---No  Does the patient have any new or worsening symptoms? ---Yes  Will a triage be completed? ---Yes  Related visit to physician within the last 2 weeks? ---No  Does the PT have any chronic conditions? (i.e. diabetes, asthma, etc.) ---No  Is this a behavioral health or substance abuse call? ---No     Guidelines    Guideline Title Affirmed Question Affirmed Notes  Cough - Acute Productive SEVERE coughing spells (e.g., whooping sound after coughing, vomiting after coughing)    Final Disposition User   See Physician within 24 Hours [place], RN, [place]    Comments  appt scheduled for 02/18/2016 with Dr. Colin Benton  appt scheduled for 2 pm on 02/18/2016 with Dr. Colin Benton   Referrals  REFERRED TO PCP OFFICE   Disagree/Comply: Comply

## 2016-02-18 NOTE — Progress Notes (Signed)
Pre visit review using our clinic review tool, if applicable. No additional management support is needed unless otherwise documented below in the visit note. 

## 2016-02-18 NOTE — Addendum Note (Signed)
Addended by: Agnes Lawrence on: 02/18/2016 02:25 PM   Modules accepted: Orders

## 2016-02-18 NOTE — Progress Notes (Addendum)
Smoker she can go the month of therapy protruding  HPI:  Acute visit for cough and congestion: -started: 2 days ago -symptoms:nasal congestion, sore throat, tickle in throat, laryngitis cough - occ productive - usually clear, occ green, chills last night -denies:fever, SOB, NVD, tooth pain, malaise, body aches -has tried: cough drops -sick contacts/travel/risks: no reported flu, strep or tick exposure -she had her flu shot a 3 days ago  ROS: See pertinent positives and negatives per HPI.  Past Medical History:  Diagnosis Date  . Acute back pain 03/12/2011  . Alkaline phosphatase elevation    hx-neg lfts  . Bacterial endocarditis 2006  . Diastolic dysfunction    Mild Echo  . Hyperlipidemia   . Hypertension   . Low back pain     Past Surgical History:  Procedure Laterality Date  . CARDIAC CATHETERIZATION     nl coronary 2006   . COLONOSCOPY    . KNEE SURGERY     Rt  . SPINE SURGERY  2006   Dr. Lorin Mercy  . US ECHOCARDIOGRAPHY     mild mitral regurgitation    Family History  Problem Relation Age of Onset  . Breast cancer Sister   . Arthritis Mother   . Hypertension Mother   . Breast cancer Mother     cancer in the blood  . Heart disease Father   . Colon cancer Neg Hx     Social History   Social History  . Marital status: Married    Spouse name: N/A  . Number of children: N/A  . Years of education: N/A   Social History Main Topics  . Smoking status: Never Smoker  . Smokeless tobacco: None  . Alcohol use No  . Drug use: No  . Sexual activity: Not Asked   Other Topics Concern  . None   Social History Narrative   Residence 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  amLODipine (NORVASC) 10 MG tablet, Take 1 tablet (10 mg total) by mouth daily., Disp: 180 tablet, Rfl: 1 .  Cholecalciferol (VITAMIN D-3 PO),  Take 2,000 Units by mouth daily., Disp: , Rfl:  .  lisinopril-hydrochlorothiazide (PRINZIDE,ZESTORETIC) 20-12.5 MG tablet, Take 1 tablet by mouth daily., Disp: 180 tablet, Rfl: 1 .  Multiple Vitamin (MULTIVITAMIN) tablet, Take 1 tablet by mouth daily.  , Disp: , Rfl:  .  benzonatate (TESSALON) 100 MG capsule, Take 1 capsule (100 mg total) by mouth 2 (two) times daily as needed for cough., Disp: 20 capsule, Rfl: 0  EXAM:  Vitals:   02/18/16 1401  BP: 108/80  Pulse: 92  Temp: 99.3 F (37.4 C)    Body mass index is 44.51 kg/m.  GENERAL: vitals reviewed and listed above, alert, oriented, appears well hydrated and in no acute distress  HEENT: atraumatic, conjunttiva clear, no obvious abnormalities on inspection of external nose and ears, normal appearance of ear canals and TMs, clear nasal congestion, mild post oropharyngeal erythema with PND, no tonsillar edema or exudate, no sinus TTP  NECK: no obvious masses on inspection  LUNGS: clear to auscultation bilaterally, no wheezes, rales or rhonchi, good air movement, no signs of resp distress, O2 sat 98 % on RA  CV: HRRR, no peripheral edema  MS: moves all extremities without noticeable abnormality  PSYCH: pleasant and cooperative, no obvious depression or anxiety  ASSESSMENT AND PLAN:  Discussed the following assessment and plan:  Viral upper respiratory tract infection  -given HPI and exam findings today, a serious infection or illness is unlikely. We discussed potential etiologies, with VURI being most likely. Influenza is on the differential, but it is unusual to have laryngitis with this so felt less likely. We discussed limitations of testing and tamiflu risks and benefits. She opted for tamifly only if testing positive and otherwise symptomatic care with follow up as needed if worsening or not improving as expected. Tessalon for cough. We discussed treatment side effects, likely course, antibiotic misuse, transmission, and signs  of developing a serious illness. -of course, we advised to return or notify a doctor immediately if symptoms worsen or persist or new concerns arise.  Flu testing negative.  Patient Instructions  INSTRUCTIONS FOR UPPER RESPIRATORY INFECTION:  -plenty of rest and fluids  -nasal saline wash 2-3 times daily (use prepackaged nasal saline or bottled/distilled water if making your own)   -can use AFRIN nasal spray for drainage and nasal congestion - but do NOT use longer then 3-4 days  -can use tylenol (in no history of liver disease) or ibuprofen (if no history of kidney disease, bowel bleeding or significant heart disease) as directed for aches and sorethroat  -in the winter time, using a humidifier at night is helpful (please follow cleaning instructions)  -if you are taking a cough medication - use only as directed, may also try a teaspoon of honey to coat the throat and throat lozenges. I sent Tessalon cough medication to your pharmacy to use as instructed as needed.  -for sore throat, salt water gargles can help  -follow up if you have fevers, facial pain, tooth pain, difficulty breathing or are worsening or symptoms persist longer then expected  Upper Respiratory Infection, Adult An upper respiratory infection (URI) is also known as the common cold. It is often caused by a type of germ (virus). Colds are easily spread (contagious). You can pass it to others by kissing, coughing, sneezing, or drinking out of the same glass. Usually, you get better in 1 to 3  weeks.  However, the cough can last for even longer. HOME CARE   Only take medicine as told by your doctor. Follow instructions provided above.  Drink enough water and fluids to keep your pee (urine) clear or pale yellow.  Get plenty of rest.  Return to work when your temperature is < 100 for 24 hours or as told by your doctor. You may use a face mask and wash your hands to stop your cold from spreading. GET HELP RIGHT AWAY IF:    After the first few days, you feel you are getting worse.  You have questions about your medicine.  You have chills, shortness of breath, or red spit (mucus).  You have pain in the face for more then 1-2 days, especially when you bend forward.  You have a fever, puffy (swollen) neck, pain when you swallow, or white spots in the back of your throat.  You have a bad headache, ear pain, sinus pain, or chest pain.  You have a high-pitched whistling sound when you breathe in and out (wheezing).  You cough up blood.  You have sore muscles or a stiff neck. MAKE SURE YOU:   Understand these instructions.  Will watch your condition.  Will get help right away  if you are not doing well or get worse. Document Released: 09/14/2007 Document Revised: 06/20/2011 Document Reviewed: 07/03/2013 [facility] Patient Information 2015 [place], [place]. This information is not intended to replace advice given to you by your health care provider. Make sure you discuss any questions you have with your health care provider.    Colin Benton R., DO

## 2016-05-13 NOTE — Progress Notes (Signed)
Pre visit review using our clinic review tool, if applicable. No additional management support is needed unless otherwise documented below in the visit note.  Chief Complaint  Patient presents with  . Medicare Wellness    HPI: Nina Le 76 y.o. comes in today for Preventive Medicare wellness visit . And  Exam  Chronic disease management  Arthritis  knee can she try topical Voltaren? bp ok no se of med Trying to keep sugars down    Health Maintenance  Topic Date Due  . DEXA SCAN  08/15/2007  . ZOSTAVAX  05/15/2017 (Originally 08/15/2002)  . TETANUS/TDAP  05/15/2017 (Originally 04/11/2014)  . MAMMOGRAM  02/14/2018  . COLONOSCOPY  07/21/2025  . INFLUENZA VACCINE  Completed  . PNA vac Low Risk Adult  Completed   Health Maintenance Review LIFESTYLE:  TAD no etoh. .  Sugar beverages: pepsi  Dec 7.5  .   Sleep:  About 6 hours    HH of  2  No pets .     MEDICARE DOCUMENT QUESTIONS  TO SCAN     Hearing: ok  Vision:  No limitations at present . Last eye check UTD   [place]    Safety:  Has smoke detector and wears seat belts.  No firearms. No excess sun exposure. Sees dentist regularly.  Falls: n  Advance directive :  Reviewed    No  Given HO to fill out form   Memory: Felt to be good  , no concern from her or her family.  Depression: No anhedonia unusual crying or depressive symptoms  Nutrition: Eats well balanced diet; adequate calcium and vitamin D. No swallowing chewing problems.  Injury: no major injuries in the last six months.  Other healthcare providers:  Reviewed today .  Social:  Lives daughter  No pets.   Preventive parameters: up-to-date  Reviewed   ADLS:   There are no problems or need for assistance  driving, feeding, obtaining food, dressing, toileting and bathing, managing money using phone. She is independent. Some mobility slowness     ROS:  See above  GEN/ HEENT: No fever, significant weight changes sweats headaches vision problems hearing  changes, CV/ PULM; No chest pain shortness of breath cough, syncope,edema  change in exercise tolerance. GI /GU: No adominal pain, vomiting, change in bowel habits. No blood in the stool. No significant GU symptoms. SKIN/HEME: ,no acute skin rashes suspicious lesions or bleeding. No lymphadenopathy, nodules, masses.  NEURO/ PSYCH:  No neurologic signs such as weakness numbness. No depression anxiety. IMM/ Allergy: No unusual infections.  Allergy .   Not now  REST of 12 system review negative except as per HPI   Past Medical History:  Diagnosis Date  . Acute back pain 03/12/2011  . Alkaline phosphatase elevation    hx-neg lfts  . Bacterial endocarditis 2006  . Diastolic dysfunction    Mild Echo  . Hyperlipidemia   . Hypertension   . Low back pain     Family History  Problem Relation Age of Onset  . Breast cancer Sister   . Arthritis Mother   . Hypertension Mother   . Breast cancer Mother     cancer in the blood  . Heart disease Father   . Colon cancer Neg Hx     Social History   Social History  . Marital status: Married    Spouse name: N/A  . Number of children: N/A  . Years of education: N/A   Social History Main  Topics  . Smoking status: Never Smoker  . Smokeless tobacco: Never Used  . Alcohol use No  . Drug use: No  . Sexual activity: Not Asked   Other Topics Concern  . 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 amLODipine (NORVASC) 10 MG tablet Take 1 tablet (10 mg total) by mouth daily.  . Cholecalciferol (VITAMIN D-3 PO) Take 2,000 Units by mouth daily.  . diclofenac sodium (VOLTAREN) 1 % GEL Apply 4 g topically 4 (four) times daily. As needed for knee pain  . lisinopril-hydrochlorothiazide (PRINZIDE,ZESTORETIC) 20-12.5 MG tablet  Take 1 tablet by mouth daily.  . Multiple Vitamin (MULTIVITAMIN) tablet Take 1 tablet by mouth daily.    . [DISCONTINUED] benzonatate (TESSALON) 100 MG capsule Take 1 capsule (100 mg total) by mouth 2 (two) times daily as needed for cough.   No facility-administered encounter medications on file as of 05/16/2016.     EXAM:  BP 106/72 (BP Location: Left Arm, Patient Position: Sitting, Cuff Size: Large)   Temp 97.9 F (36.6 C) (Oral)   Ht _0  (1.651 m)   Wt 266 lb (120.7 kg)   BMI 44.26 kg/m   Body mass index is 44.26 kg/m.  Physical Exam: Vital signs reviewed OIN:OMVE is a well-developed well-nourished alert cooperative   who appears stated age in no acute distress.  pelasenat able to get up on table  HEENT: normocephalic atraumatic , Eyes: PERRL EOM's full, conjunctiva clear, Nares: paten,t no deformity discharge or tenderness., Ears: no deformity EAC's clear TMs with normal landmarks. Mouth: clear OP, no lesions, edema.  Moist mucous membranes. Dentition in adequate repair. NECK: supple without masses, thyromegaly or bruits. CHEST/PULM:  Clear to auscultation and percussion breath sounds equal no wheeze , rales or rhonchi. No chest wall deformities or tenderness.Breast: normal by inspection . No dimpling, discharge, masses, tenderness or discharge . CV: PMI is nondisplaced, S1 S2 no gallops, murmurs, rubs. Peripheral pulses are full without delay.No JVD .  ABDOMEN: Bowel sounds normal nontender  No guard or rebound, no hepato splenomegal no CVA tenderness.   Extremtities:  No clubbing cyanosis or edema, no acute joint swelling or redness no focal atrophy  obv knee arthruitis tender lef tmedial knees  NEURO:  Oriented x3, cranial nerves 3-12 appear to be intact, no obvious focal weakness,gait within normal limits no abnormal reflexes or asymmetrical SKIN: No acute rashes normal turgor, color, no bruising or petechiae. PSYCH: Oriented, good eye contact, no obvious depression anxiety,  cognition and judgment appear normal. LN: no cervical axillary inguinal adenopathy No noted deficits in memory, attention, and speech.    Wt Readings from Last 3 Encounters:  05/16/16 266 lb (120.7 kg)  02/18/16 267 lb 8 oz (121.3 kg)  07/22/15 265 lb (120.2 kg)   BP Readings from Last 3 Encounters:  05/16/16 106/72  02/18/16 108/80  07/22/15 (!) 118/56     ASSESSMENT AND PLAN:  Discussed the following assessment and plan:  Visit for preventive health examination - Plan: Basic metabolic panel, CBC with Differential/Platelet, Hepatic function panel, Lipid panel, TSH  Medicare annual wellness visit, subsequent  Essential hypertension - Plan: Basic metabolic panel, CBC with  Differential/Platelet, Hepatic function panel, Lipid panel, TSH, Hemoglobin A1c  Hyperglycemia - Plan: Basic metabolic panel, CBC with Differential/Platelet, Hepatic function panel, Lipid panel, TSH, Hemoglobin A1c  Medication management - Plan: Basic metabolic panel, CBC with Differential/Platelet, Hepatic function panel, Lipid panel, TSH, Hemoglobin A1c  Morbid obesity, unspecified obesity type ([place]) - Plan: Basic metabolic panel, CBC with Differential/Platelet, Hepatic function panel, Lipid panel, TSH, Hemoglobin A1c  Severe obesity (BMI >= 40) (HCC)  Estrogen deficiency - Plan: DG Bone Density  Arthritis of knee - Plan: Basic metabolic panel, CBC with Differential/Platelet, Hepatic function panel, Lipid panel, TSH, Hemoglobin A1c  Need for 23-polyvalent pneumococcal polysaccharide vaccine - Plan: Pneumococcal polysaccharide vaccine 23-valent greater than or equal to 2yo subcutaneous/IM Discussed healthy eating activity certainly reasonable to add topical anti-inflammatories benefit more than risk has not had a bone density will plan to schedule one convenient. Blood pressure. He controlled continue medicine at this time. Obesity's challenging but she is doing better with her eating. Patient Care  Team: Burnis Medin, MD as PCP - General Marybelle Killings, MD (Orthopedic Surgery)  Patient Instructions  Continue avoiding sugars breads and processed carbohydrates. Limit your sweet beverages. Okay to use alcohol anti-inflammatory for the knee. We'll let you know labs when they are back. Make an appointment for a bone density scan at the [facility] office radiology department. Can call the main of our number and asked for radiology appointment. I will put an order in the system. This is to look at risk of future fractures. No change in medicine otherwise.   look into  Completing  advanced directive    Health Maintenance, Female Introduction Adopting a healthy lifestyle and getting preventive care can go a long way to promote health and wellness. Talk with your health care provider about what schedule of regular examinations is right for you. This is a good chance for you to check in with your provider about disease prevention and staying healthy. In between checkups, there are plenty of things you can do on your own. Experts have done a lot of research about which lifestyle changes and preventive measures are most likely to keep you healthy. Ask your health care provider for more information. Weight and diet Eat a healthy diet  Be sure to include plenty of vegetables, fruits, low-fat dairy products, and lean protein.  Do not eat a lot of foods high in solid fats, added sugars, or salt.  Get regular exercise. This is one of the most important things you can do for your health.  Most adults should exercise for at least 150 minutes each week. The exercise should increase your heart rate and make you sweat (moderate-intensity exercise).  Most adults should also do strengthening exercises at least twice a week. This is in addition to the moderate-intensity exercise. Maintain a healthy weight  Body mass index (BMI) is a measurement that can be used to identify possible weight problems. It  estimates body fat based on height and weight. Your health care provider can help determine your BMI and help you achieve or maintain a healthy weight.  For females 80 years of age and older:  A BMI below 18.5 is considered underweight.  A BMI of 18.5 to 24.9 is normal.  A BMI of 25 to 29.9 is considered overweight.  A BMI of 30 and above is considered obese. Watch levels of cholesterol and blood lipids  You should start having your blood tested for lipids and cholesterol at 74 years of age,  then have this test every 5 years.  You may need to have your cholesterol levels checked more often if:  Your lipid or cholesterol levels are high.  You are older than 74 years of age.  You are at high risk for heart disease. Cancer screening Lung Cancer  Lung cancer screening is recommended for adults 17-45 years old who are at high risk for lung cancer because of a history of smoking.  A yearly low-dose CT scan of the lungs is recommended for people who:  Currently smoke.  Have quit within the past 15 years.  Have at least a 30-pack-year history of smoking. A pack year is smoking an average of one pack of cigarettes a day for 1 year.  Yearly screening should continue until it has been 15 years since you quit.  Yearly screening should stop if you develop a health problem that would prevent you from having lung cancer treatment. Breast Cancer  Practice breast self-awareness. This means understanding how your breasts normally appear and feel.  It also means doing regular breast self-exams. Let your health care provider know about any changes, no matter how small.  If you are in your 20s or 30s, you should have a clinical breast exam (CBE) by a health care provider every 1-3 years as part of a regular health exam.  If you are 47 or older, have a CBE every year. Also consider having a breast X-ray (mammogram) every year.  If you have a family history of breast cancer, talk to your  health care provider about genetic screening.  If you are at high risk for breast cancer, talk to your health care provider about having an MRI and a mammogram every year.  Breast cancer gene (BRCA) assessment is recommended for women who have family members with BRCA-related cancers. BRCA-related cancers include:  Breast.  Ovarian.  Tubal.  Peritoneal cancers.  Results of the assessment will determine the need for genetic counseling and BRCA1 and BRCA2 testing. Cervical Cancer  Your health care provider may recommend that you be screened regularly for cancer of the pelvic organs (ovaries, uterus, and vagina). This screening involves a pelvic examination, including checking for microscopic changes to the surface of your cervix (Pap test). You may be encouraged to have this screening done every 3 years, beginning at age 45.  For women ages 97-65, health care providers may recommend pelvic exams and Pap testing every 3 years, or they may recommend the Pap and pelvic exam, combined with testing for human papilloma virus (HPV), every 5 years. Some types of HPV increase your risk of cervical cancer. Testing for HPV may also be done on women of any age with unclear Pap test results.  Other health care providers may not recommend any screening for nonpregnant women who are considered low risk for pelvic cancer and who do not have symptoms. Ask your health care provider if a screening pelvic exam is right for you.  If you have had past treatment for cervical cancer or a condition that could lead to cancer, you need Pap tests and screening for cancer for at least 20 years after your treatment. If Pap tests have been discontinued, your risk factors (such as having a new sexual partner) need to be reassessed to determine if screening should resume. Some women have medical problems that increase the chance of getting cervical cancer. In these cases, your health care provider may recommend more frequent  screening and Pap tests. Colorectal Cancer  This type  of cancer can be detected and often prevented.  Routine colorectal cancer screening usually begins at 74 years of age and continues through 74 years of age.  Your health care provider may recommend screening at an earlier age if you have risk factors for colon cancer.  Your health care provider may also recommend using home test kits to check for hidden blood in the stool.  A small camera at the end of a tube can be used to examine your colon directly (sigmoidoscopy or colonoscopy). This is done to check for the earliest forms of colorectal cancer.  Routine screening usually begins at age 8.  Direct examination of the colon should be repeated every 5-10 years through 74 years of age. However, you may need to be screened more often if early forms of precancerous polyps or small growths are found. Skin Cancer  Check your skin from head to toe regularly.  Tell your health care provider about any new moles or changes in moles, especially if there is a change in a mole's shape or color.  Also tell your health care provider if you have a mole that is larger than the size of a pencil eraser.  Always use sunscreen. Apply sunscreen liberally and repeatedly throughout the day.  Protect yourself by wearing long sleeves, pants, a wide-brimmed hat, and sunglasses whenever you are outside. Heart disease, diabetes, and high blood pressure  High blood pressure causes heart disease and increases the risk of stroke. High blood pressure is more likely to develop in:  People who have blood pressure in the high end of the normal range (130-139/85-89 mm Hg).  People who are overweight or obese.  People who are African American.  If you are 32-48 years of age, have your blood pressure checked every 3-5 years. If you are 67 years of age or older, have your blood pressure checked every year. You should have your blood pressure measured twice-once  when you are at a hospital or clinic, and once when you are not at a hospital or clinic. Record the average of the two measurements. To check your blood pressure when you are not at a hospital or clinic, you can use:  An automated blood pressure machine at a pharmacy.  A home blood pressure monitor.  If you are between 29 years and 18 years old, ask your health care provider if you should take aspirin to prevent strokes.  Have regular diabetes screenings. This involves taking a blood sample to check your fasting blood sugar level.  If you are at a normal weight and have a low risk for diabetes, have this test once every three years after 74 years of age.  If you are overweight and have a high risk for diabetes, consider being tested at a younger age or more often. Preventing infection Hepatitis B  If you have a higher risk for hepatitis B, you should be screened for this virus. You are considered at high risk for hepatitis B if:  You were born in a country where hepatitis B is common. Ask your health care provider which countries are considered high risk.  Your parents were born in a high-risk country, and you have not been immunized against hepatitis B (hepatitis B vaccine).  You have HIV or AIDS.  You use needles to inject street drugs.  You live with someone who has hepatitis B.  You have had sex with someone who has hepatitis B.  You get hemodialysis treatment.  You take certain  medicines for conditions, including cancer, organ transplantation, and autoimmune conditions. Hepatitis C  Blood testing is recommended for:  Everyone born from [birth year] through 1963.  Anyone with known risk factors for hepatitis C. Sexually transmitted infections (STIs)  You should be screened for sexually transmitted infections (STIs) including gonorrhea and chlamydia if:  You are sexually active and are younger than 74 years of age.  You are older than 74 years of age and your health care  provider tells you that you are at risk for this type of infection.  Your sexual activity has changed since you were last screened and you are at an increased risk for chlamydia or gonorrhea. Ask your health care provider if you are at risk.  If you do not have HIV, but are at risk, it may be recommended that you take a prescription medicine daily to prevent HIV infection. This is called pre-exposure prophylaxis (PrEP). You are considered at risk if:  You are sexually active and do not regularly use condoms or know the HIV status of your partner(s).  You take drugs by injection.  You are sexually active with a partner who has HIV. Talk with your health care provider about whether you are at high risk of being infected with HIV. If you choose to begin PrEP, you should first be tested for HIV. You should then be tested every 3 months for as long as you are taking PrEP. Pregnancy  If you are premenopausal and you may become pregnant, ask your health care provider about preconception counseling.  If you may become pregnant, take 400 to 800 micrograms (mcg) of folic acid every day.  If you want to prevent pregnancy, talk to your health care provider about birth control (contraception). Osteoporosis and menopause  Osteoporosis is a disease in which the bones lose minerals and strength with aging. This can result in serious bone fractures. Your risk for osteoporosis can be identified using a bone density scan.  If you are 11 years of age or older, or if you are at risk for osteoporosis and fractures, ask your health care provider if you should be screened.  Ask your health care provider whether you should take a calcium or vitamin D supplement to lower your risk for osteoporosis.  Menopause may have certain physical symptoms and risks.  Hormone replacement therapy may reduce some of these symptoms and risks. Talk to your health care provider about whether hormone replacement therapy is right  for you. Follow these instructions at home:  Schedule regular health, dental, and eye exams.  Stay current with your immunizations.  Do not use any tobacco products including cigarettes, chewing tobacco, or electronic cigarettes.  If you are pregnant, do not drink alcohol.  If you are breastfeeding, limit how much and how often you drink alcohol.  Limit alcohol intake to no more than 1 drink per day for nonpregnant women. One drink equals 12 ounces of beer, 5 ounces of wine, or 1 ounces of hard liquor.  Do not use street drugs.  Do not share needles.  Ask your health care provider for help if you need support or information about quitting drugs.  Tell your health care provider if you often feel depressed.  Tell your health care provider if you have ever been abused or do not feel safe at home. This information is not intended to replace advice given to you by your health care provider. Make sure you discuss any questions you have with your health care provider.  Document Released: 10/11/2010 Document Revised: 09/03/2015 Document Reviewed: 12/30/2014  2017 [employer]vier    Mariann Laster K. Zenith Lamphier M.D.

## 2016-05-16 ENCOUNTER — Ambulatory Visit (INDEPENDENT_AMBULATORY_CARE_PROVIDER_SITE_OTHER): Payer: Medicare Other | Admitting: Internal Medicine

## 2016-05-16 ENCOUNTER — Encounter: Payer: Self-pay | Admitting: Internal Medicine

## 2016-05-16 VITALS — BP 106/72 | Temp 97.9°F | Ht 65.0 in | Wt 266.0 lb

## 2016-05-16 DIAGNOSIS — M1710 Unilateral primary osteoarthritis, unspecified knee: Secondary | ICD-10-CM

## 2016-05-16 DIAGNOSIS — Z23 Encounter for immunization: Secondary | ICD-10-CM

## 2016-05-16 DIAGNOSIS — E2839 Other primary ovarian failure: Secondary | ICD-10-CM | POA: Diagnosis not present

## 2016-05-16 DIAGNOSIS — Z79899 Other long term (current) drug therapy: Secondary | ICD-10-CM

## 2016-05-16 DIAGNOSIS — Z0000 Encounter for general adult medical examination without abnormal findings: Secondary | ICD-10-CM

## 2016-05-16 DIAGNOSIS — I10 Essential (primary) hypertension: Secondary | ICD-10-CM

## 2016-05-16 DIAGNOSIS — R739 Hyperglycemia, unspecified: Secondary | ICD-10-CM | POA: Diagnosis not present

## 2016-05-16 LAB — CBC WITH DIFFERENTIAL/PLATELET
BASOS ABS: 0 10*3/uL (ref 0.0–0.1)
Basophils Relative: 0.5 % (ref 0.0–3.0)
Eosinophils Absolute: 0 10*3/uL (ref 0.0–0.7)
Eosinophils Relative: 0.4 % (ref 0.0–5.0)
HCT: 42.6 % (ref 36.0–46.0)
Hemoglobin: 14 g/dL (ref 12.0–15.0)
Lymphocytes Relative: 22.2 % (ref 12.0–46.0)
Lymphs Abs: 1.8 10*3/uL (ref 0.7–4.0)
MCHC: 32.9 g/dL (ref 30.0–36.0)
MCV: 84.8 fl (ref 78.0–100.0)
MONO ABS: 0.6 10*3/uL (ref 0.1–1.0)
Monocytes Relative: 8 % (ref 3.0–12.0)
NEUTROS ABS: 5.5 10*3/uL (ref 1.4–7.7)
NEUTROS PCT: 68.9 % (ref 43.0–77.0)
PLATELETS: 284 10*3/uL (ref 150.0–400.0)
RBC: 5.02 Mil/uL (ref 3.87–5.11)
RDW: 14.2 % (ref 11.5–15.5)
WBC: 8 10*3/uL (ref 4.0–10.5)

## 2016-05-16 LAB — HEPATIC FUNCTION PANEL
ALBUMIN: 4.1 g/dL (ref 3.5–5.2)
ALT: 11 U/L (ref 0–35)
AST: 15 U/L (ref 0–37)
Alkaline Phosphatase: 100 U/L (ref 39–117)
BILIRUBIN TOTAL: 0.5 mg/dL (ref 0.2–1.2)
Bilirubin, Direct: 0.1 mg/dL (ref 0.0–0.3)
Total Protein: 7.5 g/dL (ref 6.0–8.3)

## 2016-05-16 LAB — LIPID PANEL
CHOLESTEROL: 232 mg/dL — AB (ref 0–200)
HDL: 47 mg/dL (ref >39.00)
LDL CALC: 170 mg/dL — AB (ref 0–99)
NonHDL: 185.12
Total CHOL/HDL Ratio: 5
Triglycerides: 75 mg/dL (ref 0.0–149.0)
VLDL: 15 mg/dL (ref 0.0–40.0)

## 2016-05-16 LAB — BASIC METABOLIC PANEL
BUN: 20 mg/dL (ref 6–23)
CALCIUM: 9.6 mg/dL (ref 8.4–10.5)
CO2: 31 meq/L (ref 19–32)
Chloride: 102 mEq/L (ref 96–112)
Creatinine, Ser: 0.71 mg/dL (ref 0.40–1.20)
GFR: 103.56 mL/min (ref >60.00)
GLUCOSE: 101 mg/dL — AB (ref 70–99)
Potassium: 3.8 mEq/L (ref 3.5–5.1)
Sodium: 138 mEq/L (ref 135–145)

## 2016-05-16 LAB — TSH: TSH: 1.33 u[IU]/mL (ref 0.35–4.50)

## 2016-05-16 LAB — HEMOGLOBIN A1C: Hgb A1c MFr Bld: 5.8 % (ref 4.6–6.5)

## 2016-05-16 MED ORDER — DICLOFENAC SODIUM 1 % TD GEL: 4.0000 g | Freq: Four times a day (QID) | TRANSDERMAL | 5 refills | Status: DC

## 2016-05-16 NOTE — Patient Instructions (Addendum)
Continue avoiding sugars breads and processed carbohydrates. Limit your sweet beverages. Okay to use alcohol anti-inflammatory for the knee. We'll let you know labs when they are back. Make an appointment for a bone density scan at the [facility] office radiology department. Can call the main of our number and asked for radiology appointment. I will put an order in the system. This is to look at risk of future fractures. No change in medicine otherwise.   look into  Completing  advanced directive    Health Maintenance, Female Introduction Adopting a healthy lifestyle and getting preventive care can go a long way to promote health and wellness. Talk with your health care provider about what schedule of regular examinations is right for you. This is a good chance for you to check in with your provider about disease prevention and staying healthy. In between checkups, there are plenty of things you can do on your own. Experts have done a lot of research about which lifestyle changes and preventive measures are most likely to keep you healthy. Ask your health care provider for more information. Weight and diet Eat a healthy diet  Be sure to include plenty of vegetables, fruits, low-fat dairy products, and lean protein.  Do not eat a lot of foods high in solid fats, added sugars, or salt.  Get regular exercise. This is one of the most important things you can do for your health.  Most adults should exercise for at least 150 minutes each week. The exercise should increase your heart rate and make you sweat (moderate-intensity exercise).  Most adults should also do strengthening exercises at least twice a week. This is in addition to the moderate-intensity exercise. Maintain a healthy weight  Body mass index (BMI) is a measurement that can be used to identify possible weight problems. It estimates body fat based on height and weight. Your health care provider can help determine your BMI and help you  achieve or maintain a healthy weight.  For females 81 years of age and older:  A BMI below 18.5 is considered underweight.  A BMI of 18.5 to 24.9 is normal.  A BMI of 25 to 29.9 is considered overweight.  A BMI of 30 and above is considered obese. Watch levels of cholesterol and blood lipids  You should start having your blood tested for lipids and cholesterol at 74 years of age, then have this test every 5 years.  You may need to have your cholesterol levels checked more often if:  Your lipid or cholesterol levels are high.  You are older than 74 years of age.  You are at high risk for heart disease. Cancer screening Lung Cancer  Lung cancer screening is recommended for adults 93-55 years old who are at high risk for lung cancer because of a history of smoking.  A yearly low-dose CT scan of the lungs is recommended for people who:  Currently smoke.  Have quit within the past 15 years.  Have at least a 30-pack-year history of smoking. A pack year is smoking an average of one pack of cigarettes a day for 1 year.  Yearly screening should continue until it has been 15 years since you quit.  Yearly screening should stop if you develop a health problem that would prevent you from having lung cancer treatment. Breast Cancer  Practice breast self-awareness. This means understanding how your breasts normally appear and feel.  It also means doing regular breast self-exams. Let your health care provider know about  any changes, no matter how small.  If you are in your 20s or 30s, you should have a clinical breast exam (CBE) by a health care provider every 1-3 years as part of a regular health exam.  If you are 36 or older, have a CBE every year. Also consider having a breast X-ray (mammogram) every year.  If you have a family history of breast cancer, talk to your health care provider about genetic screening.  If you are at high risk for breast cancer, talk to your health care  provider about having an MRI and a mammogram every year.  Breast cancer gene (BRCA) assessment is recommended for women who have family members with BRCA-related cancers. BRCA-related cancers include:  Breast.  Ovarian.  Tubal.  Peritoneal cancers.  Results of the assessment will determine the need for genetic counseling and BRCA1 and BRCA2 testing. Cervical Cancer  Your health care provider may recommend that you be screened regularly for cancer of the pelvic organs (ovaries, uterus, and vagina). This screening involves a pelvic examination, including checking for microscopic changes to the surface of your cervix (Pap test). You may be encouraged to have this screening done every 3 years, beginning at age 28.  For women ages 47-65, health care providers may recommend pelvic exams and Pap testing every 3 years, or they may recommend the Pap and pelvic exam, combined with testing for human papilloma virus (HPV), every 5 years. Some types of HPV increase your risk of cervical cancer. Testing for HPV may also be done on women of any age with unclear Pap test results.  Other health care providers may not recommend any screening for nonpregnant women who are considered low risk for pelvic cancer and who do not have symptoms. Ask your health care provider if a screening pelvic exam is right for you.  If you have had past treatment for cervical cancer or a condition that could lead to cancer, you need Pap tests and screening for cancer for at least 20 years after your treatment. If Pap tests have been discontinued, your risk factors (such as having a new sexual partner) need to be reassessed to determine if screening should resume. Some women have medical problems that increase the chance of getting cervical cancer. In these cases, your health care provider may recommend more frequent screening and Pap tests. Colorectal Cancer  This type of cancer can be detected and often prevented.  Routine  colorectal cancer screening usually begins at 74 years of age and continues through 74 years of age.  Your health care provider may recommend screening at an earlier age if you have risk factors for colon cancer.  Your health care provider may also recommend using home test kits to check for hidden blood in the stool.  A small camera at the end of a tube can be used to examine your colon directly (sigmoidoscopy or colonoscopy). This is done to check for the earliest forms of colorectal cancer.  Routine screening usually begins at age 66.  Direct examination of the colon should be repeated every 5-10 years through 74 years of age. However, you may need to be screened more often if early forms of precancerous polyps or small growths are found. Skin Cancer  Check your skin from head to toe regularly.  Tell your health care provider about any new moles or changes in moles, especially if there is a change in a mole's shape or color.  Also tell your health care provider if you  have a mole that is larger than the size of a pencil eraser.  Always use sunscreen. Apply sunscreen liberally and repeatedly throughout the day.  Protect yourself by wearing long sleeves, pants, a wide-brimmed hat, and sunglasses whenever you are outside. Heart disease, diabetes, and high blood pressure  High blood pressure causes heart disease and increases the risk of stroke. High blood pressure is more likely to develop in:  People who have blood pressure in the high end of the normal range (130-139/85-89 mm Hg).  People who are overweight or obese.  People who are African American.  If you are 31-72 years of age, have your blood pressure checked every 3-5 years. If you are 82 years of age or older, have your blood pressure checked every year. You should have your blood pressure measured twice-once when you are at a hospital or clinic, and once when you are not at a hospital or clinic. Record the average of the two  measurements. To check your blood pressure when you are not at a hospital or clinic, you can use:  An automated blood pressure machine at a pharmacy.  A home blood pressure monitor.  If you are between 105 years and 3 years old, ask your health care provider if you should take aspirin to prevent strokes.  Have regular diabetes screenings. This involves taking a blood sample to check your fasting blood sugar level.  If you are at a normal weight and have a low risk for diabetes, have this test once every three years after 74 years of age.  If you are overweight and have a high risk for diabetes, consider being tested at a younger age or more often. Preventing infection Hepatitis B  If you have a higher risk for hepatitis B, you should be screened for this virus. You are considered at high risk for hepatitis B if:  You were born in a country where hepatitis B is common. Ask your health care provider which countries are considered high risk.  Your parents were born in a high-risk country, and you have not been immunized against hepatitis B (hepatitis B vaccine).  You have HIV or AIDS.  You use needles to inject street drugs.  You live with someone who has hepatitis B.  You have had sex with someone who has hepatitis B.  You get hemodialysis treatment.  You take certain medicines for conditions, including cancer, organ transplantation, and autoimmune conditions. Hepatitis C  Blood testing is recommended for:  Everyone born from [birth year] through 1963.  Anyone with known risk factors for hepatitis C. Sexually transmitted infections (STIs)  You should be screened for sexually transmitted infections (STIs) including gonorrhea and chlamydia if:  You are sexually active and are younger than 74 years of age.  You are older than 74 years of age and your health care provider tells you that you are at risk for this type of infection.  Your sexual activity has changed since you were last  screened and you are at an increased risk for chlamydia or gonorrhea. Ask your health care provider if you are at risk.  If you do not have HIV, but are at risk, it may be recommended that you take a prescription medicine daily to prevent HIV infection. This is called pre-exposure prophylaxis (PrEP). You are considered at risk if:  You are sexually active and do not regularly use condoms or know the HIV status of your partner(s).  You take drugs by injection.  You are  sexually active with a partner who has HIV. Talk with your health care provider about whether you are at high risk of being infected with HIV. If you choose to begin PrEP, you should first be tested for HIV. You should then be tested every 3 months for as long as you are taking PrEP. Pregnancy  If you are premenopausal and you may become pregnant, ask your health care provider about preconception counseling.  If you may become pregnant, take 400 to 800 micrograms (mcg) of folic acid every day.  If you want to prevent pregnancy, talk to your health care provider about birth control (contraception). Osteoporosis and menopause  Osteoporosis is a disease in which the bones lose minerals and strength with aging. This can result in serious bone fractures. Your risk for osteoporosis can be identified using a bone density scan.  If you are 55 years of age or older, or if you are at risk for osteoporosis and fractures, ask your health care provider if you should be screened.  Ask your health care provider whether you should take a calcium or vitamin D supplement to lower your risk for osteoporosis.  Menopause may have certain physical symptoms and risks.  Hormone replacement therapy may reduce some of these symptoms and risks. Talk to your health care provider about whether hormone replacement therapy is right for you. Follow these instructions at home:  Schedule regular health, dental, and eye exams.  Stay current with your  immunizations.  Do not use any tobacco products including cigarettes, chewing tobacco, or electronic cigarettes.  If you are pregnant, do not drink alcohol.  If you are breastfeeding, limit how much and how often you drink alcohol.  Limit alcohol intake to no more than 1 drink per day for nonpregnant women. One drink equals 12 ounces of beer, 5 ounces of wine, or 1 ounces of hard liquor.  Do not use street drugs.  Do not share needles.  Ask your health care provider for help if you need support or information about quitting drugs.  Tell your health care provider if you often feel depressed.  Tell your health care provider if you have ever been abused or do not feel safe at home. This information is not intended to replace advice given to you by your health care provider. Make sure you discuss any questions you have with your health care provider. Document Released: 10/11/2010 Document Revised: 09/03/2015 Document Reviewed: 12/30/2014  2017 [employer]

## 2016-05-18 ENCOUNTER — Telehealth: Payer: Self-pay | Admitting: Internal Medicine

## 2016-05-18 NOTE — Telephone Encounter (Signed)
Labs have not been notated by Dr Regis Bill. Forwarding to [facility] for follow up.

## 2016-05-18 NOTE — Telephone Encounter (Signed)
Tried to return call.  See result note.

## 2016-05-18 NOTE — Telephone Encounter (Signed)
See results note comment Blood work was normal except her cholesterol was up from last year. And blood sugar borderline up  .  Please attend to healthy eating and lifestyle. I saw the phone note that she is not feeling well she could get a sore arm in ED after the pneumonia shot but should make her significantly ill. Have her keep [place] informed if she is having fever or other flulike symptoms.

## 2016-05-18 NOTE — Telephone Encounter (Signed)
Patient Name: Nina Le DOB: [birth year] Initial Comment Caller states had pneumonia shot and blood work done on Monday. She started feeling sick. Yesterday she had chills and headache and very dizzy Nurse Assessment Nurse: Marcelline Deist, RN, Kermit Balo Date/Time (Eastern Time): 05/18/2016 11:35:26 AM Confirm and document reason for call. If symptomatic, describe symptoms. ---Caller states had pneumonia shot and blood work done on Monday. She started feeling sick right after she was there. Had not eaten either. Yesterday, she had chills and headache and was very dizzy/vertigo. Came home & got in bed Monday after the shot. Feels much better today, slight headache. Wondering if blood count was low. Does the patient have any new or worsening symptoms? ---Yes Will a triage be completed? ---Yes Related visit to physician within the last 2 weeks? ---Yes Does the PT have any chronic conditions? (i.e. diabetes, asthma, etc.) ---Yes List chronic conditions. ---on BP rx Is this a behavioral health or substance abuse call? ---No Guidelines Guideline Title Affirmed Question Affirmed Notes Immunization Reactions Pneumococcal vaccine reactions (all triage questions negative) Final Disposition [place], RN, Kermit Balo Comments Caller would like to know what her blood work showed, if she is down on her count or anything. She states she is almost back to 100% now, feels much better. Nurse explained it could have been temporary symptoms from vaccine in combination with the fact that she had not eaten, or had much to drink. Disagree/Comply: Comply

## 2016-05-19 NOTE — Telephone Encounter (Signed)
Spoke to the pt and informed her of results.  States her arm is now better.  Complains of dizziness.  No other symptoms.  She is not sure what to do.  Please advise.  OV?

## 2016-05-19 NOTE — Telephone Encounter (Signed)
Please have her check her blood pressure over the next few days. If she has a fever racing heart shortness of breath make office visit otherwise send in readings for next week.

## 2016-05-20 NOTE — Telephone Encounter (Signed)
Spoke to the pt and advised that she call back on Monday with BP readings.  Instructed her to make an ov if fever, racing heart or sob starts.

## 2016-05-23 NOTE — Telephone Encounter (Signed)
Sunday: 118/57 Monday: 123/61

## 2016-05-23 NOTE — Telephone Encounter (Signed)
Pt states she is feeling fine.  No more headache, dizziness or arm pain.  Appetite is back to normal.  See below for bp readings.  Pt notified will call back if Dr. Regis Bill has any remarks.  Otherwise, no news is good news.  Pt agreed.

## 2016-06-24 ENCOUNTER — Other Ambulatory Visit: Payer: Self-pay | Admitting: Internal Medicine

## 2017-01-10 ENCOUNTER — Other Ambulatory Visit: Payer: Self-pay | Admitting: Internal Medicine

## 2017-01-10 DIAGNOSIS — Z1231 Encounter for screening mammogram for malignant neoplasm of breast: Secondary | ICD-10-CM

## 2017-02-02 DIAGNOSIS — H2513 Age-related nuclear cataract, bilateral: Secondary | ICD-10-CM | POA: Diagnosis not present

## 2017-02-02 DIAGNOSIS — H40003 Preglaucoma, unspecified, bilateral: Secondary | ICD-10-CM | POA: Diagnosis not present

## 2017-02-02 DIAGNOSIS — H04123 Dry eye syndrome of bilateral lacrimal glands: Secondary | ICD-10-CM | POA: Diagnosis not present

## 2017-02-16 ENCOUNTER — Ambulatory Visit (INDEPENDENT_AMBULATORY_CARE_PROVIDER_SITE_OTHER): Payer: Medicare Other | Admitting: *Deleted

## 2017-02-16 ENCOUNTER — Ambulatory Visit
Admission: RE | Admit: 2017-02-16 | Discharge: 2017-02-16 | Disposition: A | Discharge status: Home / Self Care | Payer: Medicare Other | Source: Ambulatory Visit | Attending: Internal Medicine | Admitting: Internal Medicine

## 2017-02-16 DIAGNOSIS — Z23 Encounter for immunization: Secondary | ICD-10-CM

## 2017-02-16 DIAGNOSIS — Z1231 Encounter for screening mammogram for malignant neoplasm of breast: Secondary | ICD-10-CM | POA: Diagnosis not present

## 2017-05-16 NOTE — Progress Notes (Signed)
Chief Complaint  Patient presents with  . Annual Exam    no new concerns. Some knee pain- ongoing    HPI: Nina Le 77 y.o. comes in today for Preventive Medicare exam/ wellness visit .Since last visit.  Bp no problsm with medication  Feels like she is doing well trying to eat healtheir cutting back on sugfars etc . Left knee using topical  As needed    Health Maintenance  Topic Date Due  . DEXA SCAN  08/15/2007  . TETANUS/TDAP  04/11/2014  . MAMMOGRAM  02/17/2019  . COLONOSCOPY  07/21/2025  . INFLUENZA VACCINE  Completed  . PNA vac Low Risk Adult  Completed   Health Maintenance Review LIFESTYLE:  Exercise:   Not in cold    A little  Tobacco/ETS: no Alcohol:   no Sugar beverages:little soda ginger ale  One a day.  Sleep: 6 hours  Drug use: no HH:1  No pets    Hearing: ok  Vision:  No limitations at present . Last eye check UTD   Safety:  Has smoke detector and wears seat belts.  . No excess sun exposure.not regulary  dentist r  Falls:  no  Memory: Felt to be good  , no concern from her or her family.  Depression: No anhedonia unusual crying or depressive symptoms  Nutrition: Eats well balanced diet; adequate calcium and vitamin D. No swallowing chewing problems.  Injury: no major injuries in the last six months.  Other healthcare providers:  Reviewed today .  Social:  Lives alone . No pets.  Daughter moved to [employer] parameters: up-to-date  Reviewed   ADLS:   There are no problems or need for assistance  driving, feeding, obtaining food, dressing, toileting and bathing, managing money using phone. She is independent.    ROS:  GEN/ HEENT: No fever, significant weight changes sweats headaches vision problems hearing changes, CV/ PULM; No chest pain shortness of breath cough, syncope,edema  change in exercise tolerance. GI /GU: No adominal pain, vomiting, change in bowel habits. No blood in the stool. No significant GU  symptoms. SKIN/HEME: ,no acute skin rashes suspicious lesions or bleeding. No lymphadenopathy, nodules, masses.  NEURO/ PSYCH:  No neurologic signs such as weakness numbness. No depression anxiety. IMM/ Allergy: No unusual infections.  Allergy .   REST of 12 system review negative except as per HPI   Past Medical History:  Diagnosis Date  . Acute back pain 03/12/2011  . Alkaline phosphatase elevation    hx-neg lfts  . Bacterial endocarditis 2006  . Diastolic dysfunction    Mild Echo  . Hyperlipidemia   . Hypertension   . Low back pain     Family History  Problem Relation Age of Onset  . Arthritis Mother   . Hypertension Mother   . Breast cancer Mother        cancer in the blood  . Heart disease Father   . Breast cancer Sister   . Colon cancer Neg Hx     Social History   Socioeconomic History  . Marital status: Married    Spouse name: None  . Number of children: None  . Years of education: None  . Highest education level: None  Social Needs  . Financial resource strain: None  . Food insecurity - worry: None  . Food insecurity - inability: None  . Transportation needs - medical: None  . Transportation needs - non-medical: None  Occupational History  . None  Tobacco Use  . Smoking status: Never Smoker  . Smokeless tobacco: Never Used  Substance and Sexual Activity  . Alcohol use: No  . Drug use: No  . Sexual activity: None  Other Topics Concern  . 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 amLODipine (NORVASC) 10 MG tablet TAKE ONE TABLET BY MOUTH ONCE DAILY  . Cholecalciferol (VITAMIN D-3 PO) Take 2,000 Units by mouth daily.  . diclofenac sodium (VOLTAREN) 1 % GEL Apply 4 g topically 4 (four) times daily. As needed for knee pain  .  lisinopril-hydrochlorothiazide (PRINZIDE,ZESTORETIC) 20-12.5 MG tablet TAKE ONE TABLET BY MOUTH ONCE DAILY  . Multiple Vitamin (MULTIVITAMIN) tablet Take 1 tablet by mouth daily.     No facility-administered encounter medications on file as of 05/17/2017.     EXAM:  BP 98/62 (BP Location: Left Arm, Patient Position: Sitting, Cuff Size: Large)   Pulse 74   Temp 98.4 F (36.9 C) (Oral)   Ht 5' 4"  (1.626 m)   Wt 263 lb 8 oz (119.5 kg)   BMI 45.23 kg/m   Body mass index is 45.23 kg/m. Repeat bp  100/78  rr  Physical Exam: Vital signs reviewed UTM:LYYT is a well-developed well-nourished alert cooperative   who appears stated age in no acute distress.  HEENT: normocephalic atraumatic , Eyes: PERRL EOM's full, conjunctiva clear, Nares: paten,t no deformity discharge or tenderness., Ears: no deformity EAC's clear TMs with normal landmarks. Mouth: clear OP, no lesions, edema.  Moist mucous membranes. Dentition in adequate repair. NECK: supple without masses, thyromegaly or bruits. CHEST/PULM:  Clear to auscultation and percussion breath sounds equal no wheeze , rales or rhonchi. No chest wall deformities or tenderness. CV: PMI is nondisplaced, S1 S2 no gallops, murmurs, rubs. Peripheral pulsespresentNo JVD .  Breast: normal by inspection . No dimpling, discharge, masses, tenderness or discharge . ABDOMEN: Bowel sounds normal nontender  No guard or rebound, no hepato splenomegal no CVA tenderness.   Extremtities:  No clubbing cyanosis or edema, no acute joint swelling or redness no focal atrophy some tenderness left medial knee  NEURO:  Oriented x3, cranial nerves 3-12 appear to be intact, no obvious focal weaknes no abnormal reflexes or asymmetrical SKIN: No acute rashes normal turgor, color, no bruising or petechiae. PSYCH: Oriented, good eye contact, no obvious depression anxiety, cognition and judgment appear normal. LN: no cervical axillaryadenopathy No noted deficits in memory, attention,  and speech.   Lab Results  Component Value Date   WBC 8.0 05/16/2016   HGB 14.0 05/16/2016   HCT 42.6 05/16/2016   PLT 284.0 05/16/2016   GLUCOSE 101 (H) 05/16/2016   CHOL 232 (H) 05/16/2016   TRIG 75.0 05/16/2016   HDL 47.00 05/16/2016   LDLCALC 170 (H) 05/16/2016   ALT 11 05/16/2016   AST 15 05/16/2016   NA 138 05/16/2016   K 3.8 05/16/2016   CL 102 05/16/2016   CREATININE 0.71 05/16/2016   BUN 20 05/16/2016   CO2 31 05/16/2016   TSH 1.33 05/16/2016   HGBA1C 5.8 05/16/2016   Wt Readings from Last 3 Encounters:  05/17/17 263 lb 8 oz (119.5 kg)  05/16/16 266 lb (  120.7 kg)  02/18/16 267 lb 8 oz (121.3 kg)    ASSESSMENT AND PLAN:  Discussed the following assessment and plan:  Visit for preventive health examination - Plan: Basic metabolic panel, CBC with Differential/Platelet, Hemoglobin A1c, Hepatic function panel, Lipid panel  Medication management - Plan: Basic metabolic panel, CBC with Differential/Platelet, Hemoglobin A1c, Hepatic function panel, Lipid panel  Hyperglycemia - Plan: Basic metabolic panel, CBC with Differential/Platelet, Hemoglobin A1c, Hepatic function panel, Lipid panel  Essential hypertension - Plan: Basic metabolic panel, CBC with Differential/Platelet, Hemoglobin A1c, Hepatic function panel, Lipid panel  Hyperlipidemia, unspecified hyperlipidemia type - Plan: Basic metabolic panel, CBC with Differential/Platelet, Hemoglobin A1c, Hepatic function panel, Lipid panel  Arthritis of knee - left using  topical as needed  If  getting low bp  Sx  Lightheadedness etc  And bp on low side contact us and we can adjust medication  ( 1/2 norvasc 5 mg ?)  As needed.  Today she is doing well and feeling well  Patient Care Team: Panosh, Standley Brooking, MD as PCP - General Marybelle Killings, MD (Orthopedic Surgery)  Patient Instructions  Glad you are doing well. Healthy weight loss to continue  ....   Will notify you  of labs when available.  Can get singrix vaccine   In pharmacy ( new shingles vaccine)   If all is well then  cpx  yearly check up   .  Health Maintenance, Female Adopting a healthy lifestyle and getting preventive care can go a long way to promote health and wellness. Talk with your health care provider about what schedule of regular examinations is right for you. This is a good chance for you to check in with your provider about disease prevention and staying healthy. In between checkups, there are plenty of things you can do on your own. Experts have done a lot of research about which lifestyle changes and preventive measures are most likely to keep you healthy. Ask your health care provider for more information. Weight and diet Eat a healthy diet  Be sure to include plenty of vegetables, fruits, low-fat dairy products, and lean protein.  Do not eat a lot of foods high in solid fats, added sugars, or salt.  Get regular exercise. This is one of the most important things you can do for your health. ? Most adults should exercise for at least 150 minutes each week. The exercise should increase your heart rate and make you sweat (moderate-intensity exercise). ? Most adults should also do strengthening exercises at least twice a week. This is in addition to the moderate-intensity exercise.  Maintain a healthy weight  Body mass index (BMI) is a measurement that can be used to identify possible weight problems. It estimates body fat based on height and weight. Your health care provider can help determine your BMI and help you achieve or maintain a healthy weight.  For females 25 years of age and older: ? A BMI below 18.5 is considered underweight. ? A BMI of 18.5 to 24.9 is normal. ? A BMI of 25 to 29.9 is considered overweight. ? A BMI of 30 and above is considered obese.  Watch levels of cholesterol and blood lipids  You should start having your blood tested for lipids and cholesterol at 75 years of age, then have this test every 5  years.  You may need to have your cholesterol levels checked more often if: ? Your lipid or cholesterol levels are high. ? You are older than  75 years of age. ? You are at high risk for heart disease.  Cancer screening Lung Cancer  Lung cancer screening is recommended for adults 67-20 years old who are at high risk for lung cancer because of a history of smoking.  A yearly low-dose CT scan of the lungs is recommended for people who: ? Currently smoke. ? Have quit within the past 15 years. ? Have at least a 30-pack-year history of smoking. A pack year is smoking an average of one pack of cigarettes a day for 1 year.  Yearly screening should continue until it has been 15 years since you quit.  Yearly screening should stop if you develop a health problem that would prevent you from having lung cancer treatment.  Breast Cancer  Practice breast self-awareness. This means understanding how your breasts normally appear and feel.  It also means doing regular breast self-exams. Let your health care provider know about any changes, no matter how small.  If you are in your 20s or 30s, you should have a clinical breast exam (CBE) by a health care provider every 1-3 years as part of a regular health exam.  If you are 79 or older, have a CBE every year. Also consider having a breast X-ray (mammogram) every year.  If you have a family history of breast cancer, talk to your health care provider about genetic screening.  If you are at high risk for breast cancer, talk to your health care provider about having an MRI and a mammogram every year.  Breast cancer gene (BRCA) assessment is recommended for women who have family members with BRCA-related cancers. BRCA-related cancers include: ? Breast. ? Ovarian. ? Tubal. ? Peritoneal cancers.  Results of the assessment will determine the need for genetic counseling and BRCA1 and BRCA2 testing.  Cervical Cancer Your health care provider may  recommend that you be screened regularly for cancer of the pelvic organs (ovaries, uterus, and vagina). This screening involves a pelvic examination, including checking for microscopic changes to the surface of your cervix (Pap test). You may be encouraged to have this screening done every 3 years, beginning at age 42.  For women ages 96-65, health care providers may recommend pelvic exams and Pap testing every 3 years, or they may recommend the Pap and pelvic exam, combined with testing for human papilloma virus (HPV), every 5 years. Some types of HPV increase your risk of cervical cancer. Testing for HPV may also be done on women of any age with unclear Pap test results.  Other health care providers may not recommend any screening for nonpregnant women who are considered low risk for pelvic cancer and who do not have symptoms. Ask your health care provider if a screening pelvic exam is right for you.  If you have had past treatment for cervical cancer or a condition that could lead to cancer, you need Pap tests and screening for cancer for at least 20 years after your treatment. If Pap tests have been discontinued, your risk factors (such as having a new sexual partner) need to be reassessed to determine if screening should resume. Some women have medical problems that increase the chance of getting cervical cancer. In these cases, your health care provider may recommend more frequent screening and Pap tests.  Colorectal Cancer  This type of cancer can be detected and often prevented.  Routine colorectal cancer screening usually begins at 75 years of age and continues through 75 years of age.  Your health care  provider may recommend screening at an earlier age if you have risk factors for colon cancer.  Your health care provider may also recommend using home test kits to check for hidden blood in the stool.  A small camera at the end of a tube can be used to examine your colon directly  (sigmoidoscopy or colonoscopy). This is done to check for the earliest forms of colorectal cancer.  Routine screening usually begins at age 56.  Direct examination of the colon should be repeated every 5-10 years through 75 years of age. However, you may need to be screened more often if early forms of precancerous polyps or small growths are found.  Skin Cancer  Check your skin from head to toe regularly.  Tell your health care provider about any new moles or changes in moles, especially if there is a change in a mole's shape or color.  Also tell your health care provider if you have a mole that is larger than the size of a pencil eraser.  Always use sunscreen. Apply sunscreen liberally and repeatedly throughout the day.  Protect yourself by wearing long sleeves, pants, a wide-brimmed hat, and sunglasses whenever you are outside.  Heart disease, diabetes, and high blood pressure  High blood pressure causes heart disease and increases the risk of stroke. High blood pressure is more likely to develop in: ? People who have blood pressure in the high end of the normal range (130-139/85-89 mm Hg). ? People who are overweight or obese. ? People who are African American.  If you are 22-63 years of age, have your blood pressure checked every 3-5 years. If you are 66 years of age or older, have your blood pressure checked every year. You should have your blood pressure measured twice-once when you are at a hospital or clinic, and once when you are not at a hospital or clinic. Record the average of the two measurements. To check your blood pressure when you are not at a hospital or clinic, you can use: ? An automated blood pressure machine at a pharmacy. ? A home blood pressure monitor.  If you are between 41 years and 27 years old, ask your health care provider if you should take aspirin to prevent strokes.  Have regular diabetes screenings. This involves taking a blood sample to check your  fasting blood sugar level. ? If you are at a normal weight and have a low risk for diabetes, have this test once every three years after 75 years of age. ? If you are overweight and have a high risk for diabetes, consider being tested at a younger age or more often. Preventing infection Hepatitis B  If you have a higher risk for hepatitis B, you should be screened for this virus. You are considered at high risk for hepatitis B if: ? You were born in a country where hepatitis B is common. Ask your health care provider which countries are considered high risk. ? Your parents were born in a high-risk country, and you have not been immunized against hepatitis B (hepatitis B vaccine). ? You have HIV or AIDS. ? You use needles to inject street drugs. ? You live with someone who has hepatitis B. ? You have had sex with someone who has hepatitis B. ? You get hemodialysis treatment. ? You take certain medicines for conditions, including cancer, organ transplantation, and autoimmune conditions.  Hepatitis C  Blood testing is recommended for: ? Everyone born from [birth year] through 1963. ? Anyone  with known risk factors for hepatitis C.  Sexually transmitted infections (STIs)  You should be screened for sexually transmitted infections (STIs) including gonorrhea and chlamydia if: ? You are sexually active and are younger than 75 years of age. ? You are older than 75 years of age and your health care provider tells you that you are at risk for this type of infection. ? Your sexual activity has changed since you were last screened and you are at an increased risk for chlamydia or gonorrhea. Ask your health care provider if you are at risk.  If you do not have HIV, but are at risk, it may be recommended that you take a prescription medicine daily to prevent HIV infection. This is called pre-exposure prophylaxis (PrEP). You are considered at risk if: ? You are sexually active and do not regularly use condoms  or know the HIV status of your partner(s). ? You take drugs by injection. ? You are sexually active with a partner who has HIV.  Talk with your health care provider about whether you are at high risk of being infected with HIV. If you choose to begin PrEP, you should first be tested for HIV. You should then be tested every 3 months for as long as you are taking PrEP. Pregnancy  If you are premenopausal and you may become pregnant, ask your health care provider about preconception counseling.  If you may become pregnant, take 400 to 800 micrograms (mcg) of folic acid every day.  If you want to prevent pregnancy, talk to your health care provider about birth control (contraception). Osteoporosis and menopause  Osteoporosis is a disease in which the bones lose minerals and strength with aging. This can result in serious bone fractures. Your risk for osteoporosis can be identified using a bone density scan.  If you are 66 years of age or older, or if you are at risk for osteoporosis and fractures, ask your health care provider if you should be screened.  Ask your health care provider whether you should take a calcium or vitamin D supplement to lower your risk for osteoporosis.  Menopause may have certain physical symptoms and risks.  Hormone replacement therapy may reduce some of these symptoms and risks. Talk to your health care provider about whether hormone replacement therapy is right for you. Follow these instructions at home:  Schedule regular health, dental, and eye exams.  Stay current with your immunizations.  Do not use any tobacco products including cigarettes, chewing tobacco, or electronic cigarettes.  If you are pregnant, do not drink alcohol.  If you are breastfeeding, limit how much and how often you drink alcohol.  Limit alcohol intake to no more than 1 drink per day for nonpregnant women. One drink equals 12 ounces of beer, 5 ounces of wine, or 1 ounces of hard  liquor.  Do not use street drugs.  Do not share needles.  Ask your health care provider for help if you need support or information about quitting drugs.  Tell your health care provider if you often feel depressed.  Tell your health care provider if you have ever been abused or do not feel safe at home. This information is not intended to replace advice given to you by your health care provider. Make sure you discuss any questions you have with your health care provider. Document Released: 10/11/2010 Document Revised: 09/03/2015 Document Reviewed: 12/30/2014 Elsevier Interactive Patient Education  2018 [place]. Panosh M.D.

## 2017-05-17 ENCOUNTER — Encounter: Payer: Self-pay | Admitting: Internal Medicine

## 2017-05-17 ENCOUNTER — Ambulatory Visit (INDEPENDENT_AMBULATORY_CARE_PROVIDER_SITE_OTHER): Payer: Medicare Other | Admitting: Internal Medicine

## 2017-05-17 VITALS — BP 98/62 | HR 74 | Temp 98.4°F | Ht 64.0 in | Wt 263.5 lb

## 2017-05-17 DIAGNOSIS — M1710 Unilateral primary osteoarthritis, unspecified knee: Secondary | ICD-10-CM | POA: Diagnosis not present

## 2017-05-17 DIAGNOSIS — Z0000 Encounter for general adult medical examination without abnormal findings: Secondary | ICD-10-CM

## 2017-05-17 DIAGNOSIS — E785 Hyperlipidemia, unspecified: Secondary | ICD-10-CM

## 2017-05-17 DIAGNOSIS — Z79899 Other long term (current) drug therapy: Secondary | ICD-10-CM

## 2017-05-17 DIAGNOSIS — I10 Essential (primary) hypertension: Secondary | ICD-10-CM | POA: Diagnosis not present

## 2017-05-17 DIAGNOSIS — R739 Hyperglycemia, unspecified: Secondary | ICD-10-CM | POA: Diagnosis not present

## 2017-05-17 LAB — CBC WITH DIFFERENTIAL/PLATELET
Basophils Absolute: 0 10*3/uL (ref 0.0–0.1)
Basophils Relative: 0.6 % (ref 0.0–3.0)
EOS PCT: 0.4 % (ref 0.0–5.0)
Eosinophils Absolute: 0 10*3/uL (ref 0.0–0.7)
HEMATOCRIT: 40 % (ref 36.0–46.0)
Hemoglobin: 13.1 g/dL (ref 12.0–15.0)
LYMPHS ABS: 1.8 10*3/uL (ref 0.7–4.0)
Lymphocytes Relative: 27 % (ref 12.0–46.0)
MCHC: 32.8 g/dL (ref 30.0–36.0)
MCV: 84.6 fl (ref 78.0–100.0)
MONOS PCT: 8.6 % (ref 3.0–12.0)
Monocytes Absolute: 0.6 10*3/uL (ref 0.1–1.0)
NEUTROS PCT: 63.4 % (ref 43.0–77.0)
Neutro Abs: 4.3 10*3/uL (ref 1.4–7.7)
Platelets: 272 10*3/uL (ref 150.0–400.0)
RBC: 4.73 Mil/uL (ref 3.87–5.11)
RDW: 13.9 % (ref 11.5–15.5)
WBC: 6.8 10*3/uL (ref 4.0–10.5)

## 2017-05-17 LAB — BASIC METABOLIC PANEL
BUN: 18 mg/dL (ref 6–23)
CO2: 31 mEq/L (ref 19–32)
Calcium: 9.4 mg/dL (ref 8.4–10.5)
Chloride: 103 mEq/L (ref 96–112)
Creatinine, Ser: 0.71 mg/dL (ref 0.40–1.20)
GFR: 103.28 mL/min (ref >60.00)
GLUCOSE: 106 mg/dL — AB (ref 70–99)
POTASSIUM: 3.9 meq/L (ref 3.5–5.1)
SODIUM: 139 meq/L (ref 135–145)

## 2017-05-17 LAB — LIPID PANEL
Cholesterol: 201 mg/dL — ABNORMAL HIGH (ref 0–200)
HDL: 41 mg/dL (ref >39.00)
LDL Cholesterol: 147 mg/dL — ABNORMAL HIGH (ref 0–99)
NONHDL: 159.89
Total CHOL/HDL Ratio: 5
Triglycerides: 63 mg/dL (ref 0.0–149.0)
VLDL: 12.6 mg/dL (ref 0.0–40.0)

## 2017-05-17 LAB — HEPATIC FUNCTION PANEL
ALBUMIN: 3.9 g/dL (ref 3.5–5.2)
ALK PHOS: 88 U/L (ref 39–117)
ALT: 13 U/L (ref 0–35)
AST: 13 U/L (ref 0–37)
Bilirubin, Direct: 0.1 mg/dL (ref 0.0–0.3)
TOTAL PROTEIN: 7.1 g/dL (ref 6.0–8.3)
Total Bilirubin: 0.4 mg/dL (ref 0.2–1.2)

## 2017-05-17 LAB — HEMOGLOBIN A1C: Hgb A1c MFr Bld: 6 % (ref 4.6–6.5)

## 2017-05-17 MED ORDER — AMLODIPINE BESYLATE 10 MG PO TABS: 10.0000 mg | ORAL_TABLET | Freq: Every day | ORAL | 1 refills | Status: DC

## 2017-05-17 MED ORDER — LISINOPRIL-HYDROCHLOROTHIAZIDE 20-12.5 MG PO TABS: 1.0000 | ORAL_TABLET | Freq: Every day | ORAL | 1 refills | Status: DC

## 2017-05-17 NOTE — Patient Instructions (Addendum)
Glad you are doing well. Healthy weight loss to continue  ....   Will notify you  of labs when available.  Can get singrix vaccine  In pharmacy ( new shingles vaccine)   If all is well then  cpx  yearly check up   .  Health Maintenance, Female Adopting a healthy lifestyle and getting preventive care can go a long way to promote health and wellness. Talk with your health care provider about what schedule of regular examinations is right for you. This is a good chance for you to check in with your provider about disease prevention and staying healthy. In between checkups, there are plenty of things you can do on your own. Experts have done a lot of research about which lifestyle changes and preventive measures are most likely to keep you healthy. Ask your health care provider for more information. Weight and diet Eat a healthy diet  Be sure to include plenty of vegetables, fruits, low-fat dairy products, and lean protein.  Do not eat a lot of foods high in solid fats, added sugars, or salt.  Get regular exercise. This is one of the most important things you can do for your health. ? Most adults should exercise for at least 150 minutes each week. The exercise should increase your heart rate and make you sweat (moderate-intensity exercise). ? Most adults should also do strengthening exercises at least twice a week. This is in addition to the moderate-intensity exercise.  Maintain a healthy weight  Body mass index (BMI) is a measurement that can be used to identify possible weight problems. It estimates body fat based on height and weight. Your health care provider can help determine your BMI and help you achieve or maintain a healthy weight.  For females 25 years of age and older: ? A BMI below 18.5 is considered underweight. ? A BMI of 18.5 to 24.9 is normal. ? A BMI of 25 to 29.9 is considered overweight. ? A BMI of 30 and above is considered obese.  Watch levels of cholesterol and  blood lipids  You should start having your blood tested for lipids and cholesterol at 75 years of age, then have this test every 5 years.  You may need to have your cholesterol levels checked more often if: ? Your lipid or cholesterol levels are high. ? You are older than 75 years of age. ? You are at high risk for heart disease.  Cancer screening Lung Cancer  Lung cancer screening is recommended for adults 72-34 years old who are at high risk for lung cancer because of a history of smoking.  A yearly low-dose CT scan of the lungs is recommended for people who: ? Currently smoke. ? Have quit within the past 15 years. ? Have at least a 30-pack-year history of smoking. A pack year is smoking an average of one pack of cigarettes a day for 1 year.  Yearly screening should continue until it has been 15 years since you quit.  Yearly screening should stop if you develop a health problem that would prevent you from having lung cancer treatment.  Breast Cancer  Practice breast self-awareness. This means understanding how your breasts normally appear and feel.  It also means doing regular breast self-exams. Let your health care provider know about any changes, no matter how small.  If you are in your 20s or 30s, you should have a clinical breast exam (CBE) by a health care provider every 1-3 years as part of  a regular health exam.  If you are 78 or older, have a CBE every year. Also consider having a breast X-ray (mammogram) every year.  If you have a family history of breast cancer, talk to your health care provider about genetic screening.  If you are at high risk for breast cancer, talk to your health care provider about having an MRI and a mammogram every year.  Breast cancer gene (BRCA) assessment is recommended for women who have family members with BRCA-related cancers. BRCA-related cancers include: ? Breast. ? Ovarian. ? Tubal. ? Peritoneal cancers.  Results of the assessment  will determine the need for genetic counseling and BRCA1 and BRCA2 testing.  Cervical Cancer Your health care provider may recommend that you be screened regularly for cancer of the pelvic organs (ovaries, uterus, and vagina). This screening involves a pelvic examination, including checking for microscopic changes to the surface of your cervix (Pap test). You may be encouraged to have this screening done every 3 years, beginning at age 46.  For women ages 60-65, health care providers may recommend pelvic exams and Pap testing every 3 years, or they may recommend the Pap and pelvic exam, combined with testing for human papilloma virus (HPV), every 5 years. Some types of HPV increase your risk of cervical cancer. Testing for HPV may also be done on women of any age with unclear Pap test results.  Other health care providers may not recommend any screening for nonpregnant women who are considered low risk for pelvic cancer and who do not have symptoms. Ask your health care provider if a screening pelvic exam is right for you.  If you have had past treatment for cervical cancer or a condition that could lead to cancer, you need Pap tests and screening for cancer for at least 20 years after your treatment. If Pap tests have been discontinued, your risk factors (such as having a new sexual partner) need to be reassessed to determine if screening should resume. Some women have medical problems that increase the chance of getting cervical cancer. In these cases, your health care provider may recommend more frequent screening and Pap tests.  Colorectal Cancer  This type of cancer can be detected and often prevented.  Routine colorectal cancer screening usually begins at 75 years of age and continues through 75 years of age.  Your health care provider may recommend screening at an earlier age if you have risk factors for colon cancer.  Your health care provider may also recommend using home test kits to  check for hidden blood in the stool.  A small camera at the end of a tube can be used to examine your colon directly (sigmoidoscopy or colonoscopy). This is done to check for the earliest forms of colorectal cancer.  Routine screening usually begins at age 56.  Direct examination of the colon should be repeated every 5-10 years through 75 years of age. However, you may need to be screened more often if early forms of precancerous polyps or small growths are found.  Skin Cancer  Check your skin from head to toe regularly.  Tell your health care provider about any new moles or changes in moles, especially if there is a change in a mole's shape or color.  Also tell your health care provider if you have a mole that is larger than the size of a pencil eraser.  Always use sunscreen. Apply sunscreen liberally and repeatedly throughout the day.  Protect yourself by wearing long sleeves,  pants, a wide-brimmed hat, and sunglasses whenever you are outside.  Heart disease, diabetes, and high blood pressure  High blood pressure causes heart disease and increases the risk of stroke. High blood pressure is more likely to develop in: ? People who have blood pressure in the high end of the normal range (130-139/85-89 mm Hg). ? People who are overweight or obese. ? People who are African American.  If you are 2-75 years of age, have your blood pressure checked every 3-5 years. If you are 65 years of age or older, have your blood pressure checked every year. You should have your blood pressure measured twice-once when you are at a hospital or clinic, and once when you are not at a hospital or clinic. Record the average of the two measurements. To check your blood pressure when you are not at a hospital or clinic, you can use: ? An automated blood pressure machine at a pharmacy. ? A home blood pressure monitor.  If you are between 76 years and 83 years old, ask your health care provider if you should  take aspirin to prevent strokes.  Have regular diabetes screenings. This involves taking a blood sample to check your fasting blood sugar level. ? If you are at a normal weight and have a low risk for diabetes, have this test once every three years after 74 years of age. ? If you are overweight and have a high risk for diabetes, consider being tested at a younger age or more often. Preventing infection Hepatitis B  If you have a higher risk for hepatitis B, you should be screened for this virus. You are considered at high risk for hepatitis B if: ? You were born in a country where hepatitis B is common. Ask your health care provider which countries are considered high risk. ? Your parents were born in a high-risk country, and you have not been immunized against hepatitis B (hepatitis B vaccine). ? You have HIV or AIDS. ? You use needles to inject street drugs. ? You live with someone who has hepatitis B. ? You have had sex with someone who has hepatitis B. ? You get hemodialysis treatment. ? You take certain medicines for conditions, including cancer, organ transplantation, and autoimmune conditions.  Hepatitis C  Blood testing is recommended for: ? Everyone born from [birth year] through 1963. ? Anyone with known risk factors for hepatitis C.  Sexually transmitted infections (STIs)  You should be screened for sexually transmitted infections (STIs) including gonorrhea and chlamydia if: ? You are sexually active and are younger than 75 years of age. ? You are older than 75 years of age and your health care provider tells you that you are at risk for this type of infection. ? Your sexual activity has changed since you were last screened and you are at an increased risk for chlamydia or gonorrhea. Ask your health care provider if you are at risk.  If you do not have HIV, but are at risk, it may be recommended that you take a prescription medicine daily to prevent HIV infection. This is called  pre-exposure prophylaxis (PrEP). You are considered at risk if: ? You are sexually active and do not regularly use condoms or know the HIV status of your partner(s). ? You take drugs by injection. ? You are sexually active with a partner who has HIV.  Talk with your health care provider about whether you are at high risk of being infected with HIV. If you  choose to begin PrEP, you should first be tested for HIV. You should then be tested every 3 months for as long as you are taking PrEP. Pregnancy  If you are premenopausal and you may become pregnant, ask your health care provider about preconception counseling.  If you may become pregnant, take 400 to 800 micrograms (mcg) of folic acid every day.  If you want to prevent pregnancy, talk to your health care provider about birth control (contraception). Osteoporosis and menopause  Osteoporosis is a disease in which the bones lose minerals and strength with aging. This can result in serious bone fractures. Your risk for osteoporosis can be identified using a bone density scan.  If you are 83 years of age or older, or if you are at risk for osteoporosis and fractures, ask your health care provider if you should be screened.  Ask your health care provider whether you should take a calcium or vitamin D supplement to lower your risk for osteoporosis.  Menopause may have certain physical symptoms and risks.  Hormone replacement therapy may reduce some of these symptoms and risks. Talk to your health care provider about whether hormone replacement therapy is right for you. Follow these instructions at home:  Schedule regular health, dental, and eye exams.  Stay current with your immunizations.  Do not use any tobacco products including cigarettes, chewing tobacco, or electronic cigarettes.  If you are pregnant, do not drink alcohol.  If you are breastfeeding, limit how much and how often you drink alcohol.  Limit alcohol intake to no more  than 1 drink per day for nonpregnant women. One drink equals 12 ounces of beer, 5 ounces of wine, or 1 ounces of hard liquor.  Do not use street drugs.  Do not share needles.  Ask your health care provider for help if you need support or information about quitting drugs.  Tell your health care provider if you often feel depressed.  Tell your health care provider if you have ever been abused or do not feel safe at home. This information is not intended to replace advice given to you by your health care provider. Make sure you discuss any questions you have with your health care provider. Document Released: 10/11/2010 Document Revised: 09/03/2015 Document Reviewed: 12/30/2014 Elsevier Interactive Patient Education  [employer].

## 2017-05-29 ENCOUNTER — Telehealth: Payer: Self-pay | Admitting: Internal Medicine

## 2017-05-29 NOTE — Telephone Encounter (Signed)
Copied from [place]. Topic: Quick Communication - Rx Refill/Question >> May 29, 2017 12:58 PM Waylan Rocher, Lumin L wrote: Medication: amlodipine, lisinopril Has the patient contacted their pharmacy? Yes.   (Agent: If no, request that the patient contact the pharmacy for the refill.) Preferred Pharmacy (with phone number or street name): [facility]: Please be advised that RX refills may take up to 3 business days. We ask that you follow-up with your pharmacy.  Sent to wrong pharm ([facility]). Resend to [facility].

## 2017-05-31 MED ORDER — LISINOPRIL-HYDROCHLOROTHIAZIDE 20-12.5 MG PO TABS: 1.0000 | ORAL_TABLET | Freq: Every day | ORAL | 1 refills | Status: DC

## 2017-05-31 MED ORDER — AMLODIPINE BESYLATE 10 MG PO TABS: 10.0000 mg | ORAL_TABLET | Freq: Every day | ORAL | 1 refills | Status: DC

## 2017-05-31 NOTE — Telephone Encounter (Signed)
Rxs re-sent to [facility] per pt request.  Pt aware. Nothing further needed.

## 2018-01-18 ENCOUNTER — Other Ambulatory Visit: Payer: Self-pay | Admitting: Internal Medicine

## 2018-01-18 DIAGNOSIS — Z1231 Encounter for screening mammogram for malignant neoplasm of breast: Secondary | ICD-10-CM

## 2018-02-27 ENCOUNTER — Ambulatory Visit (INDEPENDENT_AMBULATORY_CARE_PROVIDER_SITE_OTHER): Payer: Medicare Other

## 2018-02-27 ENCOUNTER — Ambulatory Visit
Admission: RE | Admit: 2018-02-27 | Discharge: 2018-02-27 | Disposition: A | Discharge status: Home / Self Care | Payer: Medicare Other | Source: Ambulatory Visit | Attending: Internal Medicine | Admitting: Internal Medicine

## 2018-02-27 DIAGNOSIS — Z23 Encounter for immunization: Secondary | ICD-10-CM | POA: Diagnosis not present

## 2018-02-27 DIAGNOSIS — Z1231 Encounter for screening mammogram for malignant neoplasm of breast: Secondary | ICD-10-CM | POA: Diagnosis not present

## 2018-03-13 DIAGNOSIS — H40003 Preglaucoma, unspecified, bilateral: Secondary | ICD-10-CM | POA: Diagnosis not present

## 2018-03-13 DIAGNOSIS — H2513 Age-related nuclear cataract, bilateral: Secondary | ICD-10-CM | POA: Diagnosis not present

## 2018-05-16 NOTE — Progress Notes (Signed)
Chief Complaint  Patient presents with  . Annual Exam    Pt is concern about pain in left arm that has been hurting for 6 months only hurts sometimes. pt describes the pain as an ache. Hurts when there is pressure on it. pt states that her knee is still bothering her     HPI: Nina Le 78 y.o. comes in today for Preventive Medicare exam/ wellness visit .and med management   Sore left arm upper   Not all the time.    Worse with  Muscle area s on it.   No injury  Radiation  Cp sob  BP in control no se noted  Left knee problematic  Still remot hx of injections  Health Maintenance  Topic Date Due  . DEXA SCAN  08/15/2007  . TETANUS/TDAP  04/11/2014  . COLONOSCOPY  07/21/2025  . INFLUENZA VACCINE  Completed  . PNA vac Low Risk Adult  Completed   Health Maintenance Review LIFESTYLE:  Exercise:   Nothing  caus cold     Mail box   Otherwise .  Tobacco/ETS: no Alcohol:  no Sugar beverages:  Ginger ale  Small once a day .  Sleep: 7-8  Drug use: no HH:  1   No pets   Hearing: ok  Vision:  No limitations at present . Last eye check UTD  Safety:  Has smoke detector and wears seat belts.  . No excess sun exposure. Sees dentist regularly.  Falls:   X mas   No injury step   Advance directive :  HO given for hcpoa etc   Memory: Felt to be good  , no concern from her or her family.  Depression: No anhedonia unusual crying or depressive symptoms  Nutrition: Eats well balanced diet; adequate calcium and vitamin D. No swallowing chewing problems.  Injury: no major injuries in the last six months.  Other healthcare providers:  Reviewed today .  Preventive parameters: dsic shingrix  Reviewed   ADLS:   There are no problems or need for assistance  driving, feeding, obtaining food, dressing, toileting and bathing, managing money using phone. She is independent.but  Arthritis    ROS: see hpi  GEN/ HEENT: No fever, significant weight changes sweats headaches vision problems  hearing changes, CV/ PULM; No chest pain shortness of breath cough, syncope,edema  change in exercise tolerance. GI /GU: No adominal pain, vomiting, change in bowel habits. No blood in the stool. No significant GU symptoms. SKIN/HEME: ,no acute skin rashes suspicious lesions or bleeding. No lymphadenopathy, nodules, masses.  NEURO/ PSYCH:  No neurologic signs such as weakness numbness. No depression anxiety. IMM/ Allergy: No unusual infections.  Allergy .   REST of 12 system review negative except as per HPI   Past Medical History:  Diagnosis Date  . Acute back pain 03/12/2011  . Alkaline phosphatase elevation    hx-neg lfts  . Bacterial endocarditis 2006  . Diastolic dysfunction    Mild Echo  . Hyperlipidemia   . Hypertension   . Low back pain     Family History  Problem Relation Age of Onset  . Arthritis Mother   . Hypertension Mother   . Breast cancer Mother        cancer in the blood  . Heart disease Father   . Breast cancer Sister   . Colon cancer Neg Hx     Social History   Socioeconomic History  . Marital status: Married    Spouse  name: Not on file  . Number of children: Not on file  . Years of education: Not on file  . Highest education level: Not on file  Occupational History  . Not on file  Social Needs  . Financial resource strain: Not on file  . Food insecurity:    Worry: Not on file    Inability: Not on file  . Transportation needs:    Medical: Not on file    Non-medical: Not on file  Tobacco Use  . Smoking status: Never Smoker  . Smokeless tobacco: Never Used  Substance and Sexual Activity  . Alcohol use: No  . Drug use: No  . Sexual activity: Not on file  Lifestyle  . Physical activity:    Days per week: Not on file    Minutes per session: Not on file  . Stress: Not on file  Relationships  . Social connections:    Talks on phone: Not on file    Gets together: Not on file    Attends religious service: Not on file    Active member of  club or organization: Not on file    Attends meetings of clubs or organizations: Not on file    Relationship status: Not on file  Other Topics Concern  . 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 amLODipine (NORVASC) 10 MG tablet Take 1 tablet (10 mg total) by mouth daily.  . Cholecalciferol (VITAMIN D-3 PO) Take 2,000 Units by mouth daily.  . diclofenac sodium (VOLTAREN) 1 % GEL Apply 4 g topically 4 (four) times daily. As needed for knee pain  . lisinopril-hydrochlorothiazide (PRINZIDE,ZESTORETIC) 20-12.5 MG tablet Take 1 tablet by mouth daily.  . Multiple Vitamin (MULTIVITAMIN) tablet Take 1 tablet by mouth daily.     No facility-administered encounter medications on file as of 05/18/2018.     EXAM:  BP 128/72 (BP Location: Right Arm, Patient Position: Sitting, Cuff Size: Large)   Pulse 82   Temp 97.8 F (36.6 C) (Oral)   Ht 5' 5"  (1.651 m)   Wt 256 lb 6.4 oz (116.3 kg)   BMI 42.67 kg/m   Body mass index is 42.67 kg/m.  Physical Exam: Vital signs reviewed OAC:ZYSA is a well-developed well-nourished alert cooperative   who appears stated age in no acute distress.  HEENT: normocephalic atraumatic , Eyes: PERRL EOM's full, conjunctiva clear, Nares: paten,t no deformity discharge or tenderness., Ears: no deformity EAC's clear TMs with normal landmarks. Mouth: clear OP, no lesions, edema.  Moist mucous membranes. Dentition in adequate repair. NECK: supple without masses, thyromegaly or bruits. CHEST/PULM:  Clear to auscultation and percussion breath sounds equal no wheeze , rales or rhonchi. No chest wall deformities or tenderness.Breast: normal by inspection . No dimpling, discharge, masses, tenderness or discharge . CV: PMI is nondisplaced, S1 S2 no  gallops, murmurs, rubs. Peripheral pulses are present  delay.No JVD .  BDOMEN: Bowel sounds normal nontender  No guard or rebound, no hepato splenomegal no CVA tenderness.   Extremtities:  No clubbing cyanosis or edema, no acute joint swelling or redness no focal atrophy left knee 1+ effusion no warmth  Left arm  nl  ? Tender at biceps no masses  NEURO:  Oriented x3, cranial nerves 3-12 appear to be intact, no obvious focal weakness,gait mild antalgic  Favoring lef kness SKIN: No acute rashes normal turgor, color, no bruising or petechiae. PSYCH: Oriented, good eye contact, no obvious depression anxiety, cognition and judgment appear normal. LN: no cervical axillaryadenopathy No noted deficits in memory, attention, and speech.   Lab Results  Component Value Date   WBC 7.8 05/18/2018   HGB 13.3 05/18/2018   HCT 40.0 05/18/2018   PLT 282.0 05/18/2018   GLUCOSE 84 05/18/2018   CHOL 201 (H) 05/18/2018   TRIG 59.0 05/18/2018   HDL 45.60 05/18/2018   LDLCALC 144 (H) 05/18/2018   ALT 14 05/18/2018   AST 15 05/18/2018   NA 140 05/18/2018   K 4.0 05/18/2018   CL 101 05/18/2018   CREATININE 0.80 05/18/2018   BUN 18 05/18/2018   CO2 31 05/18/2018   TSH 1.33 05/16/2016   HGBA1C 5.8 05/18/2018   Wt Readings from Last 3 Encounters:  05/18/18 256 lb 6.4 oz (116.3 kg)  05/17/17 263 lb 8 oz (119.5 kg)  05/16/16 266 lb (120.7 kg)    ASSESSMENT AND PLAN:  Discussed the following assessment and plan:  Visit for preventive health examination  Medication management - Plan: Basic metabolic panel, CBC with Differential/Platelet, Hemoglobin A1c, Hepatic function panel, Lipid panel  Hyperlipidemia, unspecified hyperlipidemia type - Plan: Basic metabolic panel, CBC with Differential/Platelet, Hemoglobin A1c, Hepatic function panel, Lipid panel  Essential hypertension - Plan: Basic metabolic panel, CBC with Differential/Platelet, Hemoglobin A1c, Hepatic function panel, Lipid panel  Hyperglycemia  - Plan: Basic metabolic panel, CBC with Differential/Platelet, Hemoglobin A1c, Hepatic function panel, Lipid panel  Severe obesity (BMI >= 40) (HCC) - Plan: Basic metabolic panel, CBC with Differential/Platelet, Hemoglobin A1c, Hepatic function panel, Lipid panel  Estrogen deficiency - Plan: DG Bone Density  Left upper arm pain  Arthritis of knee See instructions  Order for dexa  Disc shingrix Patient Care Team: Burnis Medin, MD as PCP - Marianna Payment, MD (Orthopedic Surgery)  Patient Instructions    Your blood pressure is in range   uncertain cause of your left arm pain but it could be related to your shoulder or perhaps a bursitis in your shoulder radiating to your arm it does not sound like a heart problem.   If getting worse and persistent see the orthopedist specialist.  They may also want to reevaluate your left knee problem.  Continue healthy weight loss .  Wt Readings from Last 3 Encounters:  05/18/18 256 lb 6.4 oz (116.3 kg)  05/17/17 263 lb 8 oz (119.5 kg)  05/16/16 266 lb (120.7 kg)     Lab tests today we will let you know results  Consider new shingles vaccine get at your pharmacy.  Can get a bone density if wish at [facility]  dept at [place] office  Make appt when conveneient   Preventive Care 18 Years and Older, Female Preventive care refers to lifestyle choices and visits with your health care provider that can promote health and wellness. What does preventive care include?  A yearly physical exam. This is also called an annual well check.  Dental exams once or twice a year.  Routine eye exams. Ask your health care provider how often you should have your eyes checked.  Personal lifestyle choices, including: ? Daily care of your teeth and gums. ? Regular physical activity. ? Eating a healthy  diet. ? Avoiding tobacco and drug use. ? Limiting alcohol use. ? Practicing safe sex. ? Taking low-dose aspirin every day. ? Taking vitamin  and mineral supplements as recommended by your health care provider. What happens during an annual well check? The services and screenings done by your health care provider during your annual well check will depend on your age, overall health, lifestyle risk factors, and family history of disease. Counseling Your health care provider may ask you questions about your:  Alcohol use.  Tobacco use.  Drug use.  Emotional well-being.  Home and relationship well-being.  Sexual activity.  Eating habits.  History of falls.  Memory and ability to understand (cognition).  Work and work Statistician.  Reproductive health.  Screening You may have the following tests or measurements:  Height, weight, and BMI.  Blood pressure.  Lipid and cholesterol levels. These may be checked every 5 years, or more frequently if you are over 77 years old.  Skin check.  Lung cancer screening. You may have this screening every year starting at age 75 if you have a 30-pack-year history of smoking and currently smoke or have quit within the past 15 years.  Colorectal cancer screening. All adults should have this screening starting at age 15 and continuing until age 50. You will have tests every 1-10 years, depending on your results and the type of screening test. People at increased risk should start screening at an earlier age. Screening tests may include: ? Guaiac-based fecal occult blood testing. ? Fecal immunochemical test (FIT). ? Stool DNA test. ? Virtual colonoscopy. ? Sigmoidoscopy. During this test, a flexible tube with a tiny camera (sigmoidoscope) is used to examine your rectum and lower colon. The sigmoidoscope is inserted through your anus into your rectum and lower colon. ? Colonoscopy. During this test, a long, thin, flexible tube with a tiny camera (colonoscope) is used to examine your entire colon and rectum.  Hepatitis C blood test.  Hepatitis B blood test.  Sexually transmitted  disease (STD) testing.  Diabetes screening. This is done by checking your blood sugar (glucose) after you have not eaten for a while (fasting). You may have this done every 1-3 years.  Bone density scan. This is done to screen for osteoporosis. You may have this done starting at age 78.  Mammogram. This may be done every 1-2 years. Talk to your health care provider about how often you should have regular mammograms. Talk with your health care provider about your test results, treatment options, and if necessary, the need for more tests. Vaccines Your health care provider may recommend certain vaccines, such as:  Influenza vaccine. This is recommended every year.  Tetanus, diphtheria, and acellular pertussis (Tdap, Td) vaccine. You may need a Td booster every 10 years.  Varicella vaccine. You may need this if you have not been vaccinated.  Zoster vaccine. You may need this after age 82.  Measles, mumps, and rubella (MMR) vaccine. You may need at least one dose of MMR if you were born in 1957 or later. You may also need a second dose.  Pneumococcal 13-valent conjugate (PCV13) vaccine. One dose is recommended after age 42.  Pneumococcal polysaccharide (PPSV23) vaccine. One dose is recommended after age 36.  Meningococcal vaccine. You may need this if you have certain conditions.  Hepatitis A vaccine. You may need this if you have certain conditions or if you travel or work in places where you may be exposed to hepatitis A.  Hepatitis B vaccine.  You may need this if you have certain conditions or if you travel or work in places where you may be exposed to hepatitis B.  Haemophilus influenzae type b (Hib) vaccine. You may need this if you have certain conditions. Talk to your health care provider about which screenings and vaccines you need and how often you need them. This information is not intended to replace advice given to you by your health care provider. Make sure you discuss any  questions you have with your health care provider. Document Released: 04/24/2015 Document Revised: 05/18/2017 Document Reviewed: 01/27/2015 Elsevier Interactive Patient Education  2019 [place]. Yasira Engelson M.D.

## 2018-05-18 ENCOUNTER — Ambulatory Visit (INDEPENDENT_AMBULATORY_CARE_PROVIDER_SITE_OTHER): Payer: BLUE CROSS/BLUE SHIELD | Admitting: Internal Medicine

## 2018-05-18 ENCOUNTER — Encounter: Payer: Self-pay | Admitting: Internal Medicine

## 2018-05-18 VITALS — BP 128/72 | HR 82 | Temp 97.8°F | Ht 65.0 in | Wt 256.4 lb

## 2018-05-18 DIAGNOSIS — R739 Hyperglycemia, unspecified: Secondary | ICD-10-CM | POA: Diagnosis not present

## 2018-05-18 DIAGNOSIS — Z79899 Other long term (current) drug therapy: Secondary | ICD-10-CM

## 2018-05-18 DIAGNOSIS — E785 Hyperlipidemia, unspecified: Secondary | ICD-10-CM

## 2018-05-18 DIAGNOSIS — M79622 Pain in left upper arm: Secondary | ICD-10-CM

## 2018-05-18 DIAGNOSIS — Z0000 Encounter for general adult medical examination without abnormal findings: Secondary | ICD-10-CM

## 2018-05-18 DIAGNOSIS — I10 Essential (primary) hypertension: Secondary | ICD-10-CM | POA: Diagnosis not present

## 2018-05-18 DIAGNOSIS — M1710 Unilateral primary osteoarthritis, unspecified knee: Secondary | ICD-10-CM

## 2018-05-18 DIAGNOSIS — E2839 Other primary ovarian failure: Secondary | ICD-10-CM

## 2018-05-18 LAB — HEPATIC FUNCTION PANEL
ALBUMIN: 4.1 g/dL (ref 3.5–5.2)
ALT: 14 U/L (ref 0–35)
AST: 15 U/L (ref 0–37)
Alkaline Phosphatase: 101 U/L (ref 39–117)
Bilirubin, Direct: 0.1 mg/dL (ref 0.0–0.3)
TOTAL PROTEIN: 7.1 g/dL (ref 6.0–8.3)
Total Bilirubin: 0.4 mg/dL (ref 0.2–1.2)

## 2018-05-18 LAB — LIPID PANEL
Cholesterol: 201 mg/dL — ABNORMAL HIGH (ref 0–200)
HDL: 45.6 mg/dL (ref >39.00)
LDL Cholesterol: 144 mg/dL — ABNORMAL HIGH (ref 0–99)
NonHDL: 155.85
Total CHOL/HDL Ratio: 4
Triglycerides: 59 mg/dL (ref 0.0–149.0)
VLDL: 11.8 mg/dL (ref 0.0–40.0)

## 2018-05-18 LAB — CBC WITH DIFFERENTIAL/PLATELET
BASOS PCT: 0.5 % (ref 0.0–3.0)
Basophils Absolute: 0 10*3/uL (ref 0.0–0.1)
EOS ABS: 0 10*3/uL (ref 0.0–0.7)
Eosinophils Relative: 0.4 % (ref 0.0–5.0)
HCT: 40 % (ref 36.0–46.0)
HEMOGLOBIN: 13.3 g/dL (ref 12.0–15.0)
LYMPHS ABS: 1.8 10*3/uL (ref 0.7–4.0)
Lymphocytes Relative: 22.3 % (ref 12.0–46.0)
MCHC: 33.2 g/dL (ref 30.0–36.0)
MCV: 84.7 fl (ref 78.0–100.0)
MONO ABS: 0.6 10*3/uL (ref 0.1–1.0)
Monocytes Relative: 8.1 % (ref 3.0–12.0)
NEUTROS PCT: 68.7 % (ref 43.0–77.0)
Neutro Abs: 5.4 10*3/uL (ref 1.4–7.7)
Platelets: 282 10*3/uL (ref 150.0–400.0)
RBC: 4.72 Mil/uL (ref 3.87–5.11)
RDW: 14 % (ref 11.5–15.5)
WBC: 7.8 10*3/uL (ref 4.0–10.5)

## 2018-05-18 LAB — BASIC METABOLIC PANEL
BUN: 18 mg/dL (ref 6–23)
CHLORIDE: 101 meq/L (ref 96–112)
CO2: 31 mEq/L (ref 19–32)
CREATININE: 0.8 mg/dL (ref 0.40–1.20)
Calcium: 9.6 mg/dL (ref 8.4–10.5)
GFR: 84.44 mL/min (ref >60.00)
GLUCOSE: 84 mg/dL (ref 70–99)
Potassium: 4 mEq/L (ref 3.5–5.1)
Sodium: 140 mEq/L (ref 135–145)

## 2018-05-18 LAB — HEMOGLOBIN A1C: HEMOGLOBIN A1C: 5.8 % (ref 4.6–6.5)

## 2018-05-18 NOTE — Patient Instructions (Addendum)
Your blood pressure is in range   uncertain cause of your left arm pain but it could be related to your shoulder or perhaps a bursitis in your shoulder radiating to your arm it does not sound like a heart problem.   If getting worse and persistent see the orthopedist specialist.  They may also want to reevaluate your left knee problem.  Continue healthy weight loss .  Wt Readings from Last 3 Encounters:  05/18/18 256 lb 6.4 oz (116.3 kg)  05/17/17 263 lb 8 oz (119.5 kg)  05/16/16 266 lb (120.7 kg)     Lab tests today we will let you know results  Consider new shingles vaccine get at your pharmacy.  Can get a bone density if wish at [place] [facility]  dept at [place] office  Make appt when conveneient   Preventive Care 8 Years and Older, Female Preventive care refers to lifestyle choices and visits with your health care provider that can promote health and wellness. What does preventive care include?  A yearly physical exam. This is also called an annual well check.  Dental exams once or twice a year.  Routine eye exams. Ask your health care provider how often you should have your eyes checked.  Personal lifestyle choices, including: ? Daily care of your teeth and gums. ? Regular physical activity. ? Eating a healthy diet. ? Avoiding tobacco and drug use. ? Limiting alcohol use. ? Practicing safe sex. ? Taking low-dose aspirin every day. ? Taking vitamin and mineral supplements as recommended by your health care provider. What happens during an annual well check? The services and screenings done by your health care provider during your annual well check will depend on your age, overall health, lifestyle risk factors, and family history of disease. Counseling Your health care provider may ask you questions about your:  Alcohol use.  Tobacco use.  Drug use.  Emotional well-being.  Home and relationship well-being.  Sexual activity.  Eating habits.  History of  falls.  Memory and ability to understand (cognition).  Work and work Statistician.  Reproductive health.  Screening You may have the following tests or measurements:  Height, weight, and BMI.  Blood pressure.  Lipid and cholesterol levels. These may be checked every 5 years, or more frequently if you are over 70 years old.  Skin check.  Lung cancer screening. You may have this screening every year starting at age 81 if you have a 30-pack-year history of smoking and currently smoke or have quit within the past 15 years.  Colorectal cancer screening. All adults should have this screening starting at age 74 and continuing until age 68. You will have tests every 1-10 years, depending on your results and the type of screening test. People at increased risk should start screening at an earlier age. Screening tests may include: ? Guaiac-based fecal occult blood testing. ? Fecal immunochemical test (FIT). ? Stool DNA test. ? Virtual colonoscopy. ? Sigmoidoscopy. During this test, a flexible tube with a tiny camera (sigmoidoscope) is used to examine your rectum and lower colon. The sigmoidoscope is inserted through your anus into your rectum and lower colon. ? Colonoscopy. During this test, a long, thin, flexible tube with a tiny camera (colonoscope) is used to examine your entire colon and rectum.  Hepatitis C blood test.  Hepatitis B blood test.  Sexually transmitted disease (STD) testing.  Diabetes screening. This is done by checking your blood sugar (glucose) after you have not eaten for a  while (fasting). You may have this done every 1-3 years.  Bone density scan. This is done to screen for osteoporosis. You may have this done starting at age 46.  Mammogram. This may be done every 1-2 years. Talk to your health care provider about how often you should have regular mammograms. Talk with your health care provider about your test results, treatment options, and if necessary, the need  for more tests. Vaccines Your health care provider may recommend certain vaccines, such as:  Influenza vaccine. This is recommended every year.  Tetanus, diphtheria, and acellular pertussis (Tdap, Td) vaccine. You may need a Td booster every 10 years.  Varicella vaccine. You may need this if you have not been vaccinated.  Zoster vaccine. You may need this after age 28.  Measles, mumps, and rubella (MMR) vaccine. You may need at least one dose of MMR if you were born in 1957 or later. You may also need a second dose.  Pneumococcal 13-valent conjugate (PCV13) vaccine. One dose is recommended after age 34.  Pneumococcal polysaccharide (PPSV23) vaccine. One dose is recommended after age 65.  Meningococcal vaccine. You may need this if you have certain conditions.  Hepatitis A vaccine. You may need this if you have certain conditions or if you travel or work in places where you may be exposed to hepatitis A.  Hepatitis B vaccine. You may need this if you have certain conditions or if you travel or work in places where you may be exposed to hepatitis B.  Haemophilus influenzae type b (Hib) vaccine. You may need this if you have certain conditions. Talk to your health care provider about which screenings and vaccines you need and how often you need them. This information is not intended to replace advice given to you by your health care provider. Make sure you discuss any questions you have with your health care provider. Document Released: 04/24/2015 Document Revised: 05/18/2017 Document Reviewed: 01/27/2015 Elsevier Interactive Patient Education  2019 [employer].

## 2018-07-24 ENCOUNTER — Other Ambulatory Visit: Payer: Self-pay | Admitting: Internal Medicine

## 2018-07-26 ENCOUNTER — Other Ambulatory Visit: Payer: Self-pay | Admitting: Internal Medicine

## 2019-01-22 ENCOUNTER — Other Ambulatory Visit: Payer: Self-pay | Admitting: Internal Medicine

## 2019-01-22 DIAGNOSIS — Z1231 Encounter for screening mammogram for malignant neoplasm of breast: Secondary | ICD-10-CM

## 2019-03-10 NOTE — Progress Notes (Signed)
This visit occurred during the SARS-CoV-2 public health emergency.  Safety protocols were in place, including screening questions prior to the visit, additional usage of staff PPE, and extensive cleaning of exam room while observing appropriate contact time as indicated for disinfecting solutions.    Chief Complaint  Patient presents with  . Hip Pain    Pt states she has been having right hip for a week and states that when she walks it radiates up her buttocks on her right side     HPI: Nina Le 78 y.o. come in for  New problem   Last Friday week  10 days when got up from  [facility] right mid buttock .   Felt pulling  And then sore down  Right side of leg  To the knee   r like  Pulled muscle.  And heating pad .   And switched  Ice to  Heat.   Was really bad on t giving .   never had this type of pain before was bad enough to decrease her appetite.  No falling.Marland Kitchen lbetter with laying down  And feet up is better .  Walking hurts   .  No numbness or weakness .  May have some mild back discomfort. Knees are  Weak at times   Back suregery 10 years.  Different pain than when she had her back surgery has taken Tylenol with modest effect.    No fever.   ROS: See pertinent positives and negatives per HPI. She has been trying to lose weight by decreasing her baked goods and feels good about the reduction. Past Medical History:  Diagnosis Date  . Acute back pain 03/12/2011  . Alkaline phosphatase elevation    hx-neg lfts  . Bacterial endocarditis 2006  . Diastolic dysfunction    Mild Echo  . Hyperlipidemia   . Hypertension   . Low back pain     Family History  Problem Relation Age of Onset  . Arthritis Mother   . Hypertension Mother   . Breast cancer Mother        cancer in the blood  . Heart disease Father   . Breast cancer Sister   . Colon cancer Neg Hx     Social History   Socioeconomic History  . Marital status: Married    Spouse name: Not on  file  . Number of children: Not on file  . Years of education: Not on file  . Highest education level: Not on file  Occupational History  . Not on file  Social Needs  . Financial resource strain: Not on file  . Food insecurity    Worry: Not on file    Inability: Not on file  . Transportation needs    Medical: Not on file    Non-medical: Not on file  Tobacco Use  . Smoking status: Never Smoker  . Smokeless tobacco: Never Used  Substance and Sexual Activity  . Alcohol use: No  . Drug use: No  . Sexual activity: Not on file  Lifestyle  . Physical activity    Days per week: Not on file    Minutes per session: Not on file  . Stress: Not on file  Relationships  . Social Herbalist on phone: Not on file    Gets together: Not on file    Attends religious service: Not on file    Active member of club or organization: Not on  file    Attends meetings of clubs or organizations: Not on file    Relationship status: Not on file  Other Topics Concern  . 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 amLODipine (NORVASC) 10 MG tablet Take 1 tablet by mouth once daily 180 tablet 0  . Cholecalciferol (VITAMIN D-3 PO) Take 2,000 Units by mouth daily.    . diclofenac sodium (VOLTAREN) 1 % GEL Apply 4 g topically 4 (four) times daily. As needed for knee pain 4 Tube 5  . lisinopril-hydrochlorothiazide (ZESTORETIC) 20-12.5 MG tablet Take 1 tablet by mouth once daily 180 tablet 0  . Multiple Vitamin (MULTIVITAMIN) tablet Take 1 tablet by mouth daily.       No facility-administered medications prior to visit.      EXAM:  BP 130/72 (BP Location: Right Arm, Patient Position: Sitting, Cuff Size: Normal)   Pulse 76   Temp 97.8 F (36.6 C) (Temporal)    Wt 248 lb 6.4 oz (112.7 kg)   SpO2 98%   BMI 41.34 kg/m   Body mass index is 41.34 kg/m.  GENERAL: vitals reviewed and listed above, alert, oriented, appears well hydrated and in no acute distress HEENT: atraumatic, conjunctiva  clear, no obvious abnormalities on inspection of external nose and ears OP masked NECK: no obvious masses on inspection palpation  LUNGS: clear to auscultation bilaterally, no wheezes, rales or rhonchi, good air movement CV: HRRR, no clubbing cyanosis or  peripheral edema nl cap refill  MS: moves all extremities points to right mid buttocks area as area of discomfort no back midline tenderness well-healed scar.  Her gait is seems to be even she is able to put weight on her right leg without movement and increasing pain but walking is painful.  No obvious weakness.  No Trendelenburg.  She has no groin pain. PSYCH: pleasant and cooperative, no obvious depression or anxiety Lab Results  Component Value Date   WBC 7.8 05/18/2018   HGB 13.3 05/18/2018   HCT 40.0 05/18/2018   PLT 282.0 05/18/2018   GLUCOSE 84 05/18/2018   CHOL 201 (H) 05/18/2018   TRIG 59.0 05/18/2018   HDL 45.60 05/18/2018   LDLCALC 144 (H) 05/18/2018   ALT 14 05/18/2018   AST 15 05/18/2018   NA 140 05/18/2018   K 4.0 05/18/2018   CL 101 05/18/2018   CREATININE 0.80 05/18/2018   BUN 18 05/18/2018   CO2 31 05/18/2018   TSH 1.33 05/16/2016   HGBA1C 5.8 05/18/2018   BP Readings from Last 3 Encounters:  03/11/19 130/72  05/18/18 128/72  05/17/17 98/62   Wt Readings from Last 3 Encounters:  03/11/19 248 lb 6.4 oz (112.7 kg)  05/18/18 256 lb 6.4 oz (116.3 kg)  05/17/17 263 lb 8 oz (119.5 kg)     ASSESSMENT AND PLAN:  Discussed the following assessment and plan:  Right hip pain - Plan: DG Hip Unilat W OR W/O Pelvis 2-3 Views Right, DG Hip Unilat W OR W/O Pelvis 2-3 Views Right  Need for immunization against influenza - Plan: Flu Vaccine QUAD High Dose(Fluad)  Acute buttock pain -  Plan: DG Hip Unilat W  OR W/O Pelvis 2-3 Views Right, DG Hip Unilat W OR W/O Pelvis 2-3 Views Right  Severe obesity (BMI >= 40) (HCC) The pain appears to be musculoskeletal in origin although uncertain if from back or hip.  She has mild arthritis on x-ray discussed medicines risk-benefit can use Mobic once a day for 5 to 7 days and then as needed and Tylenol as an add-on.  Also can use topicals.  Is somewhat better today but discussed alarm symptoms such as fever weakness.  For which she would seek more urgent care. If not improving over the next couple weeks contact us and we may refer to her orthopedist or other specialist.  At this time her x-ray shows nothing alarming. Congratulated her on her weight loss since last year. -Patient advised to return or notify health care team  if  new concerns arise.  Patient Instructions  This could be  Hip soft tissue  Arthritis  Mild  or even  Sciatica  Where the nerve  goes through the pyriformis   Muscles.   This can get better on its own with slow recovery  tylneol 2-3 x per day can try otc   patches and avoid prolonged sitting     If not better in 1-2 weeks then n consdier getting   Ortho pedics to see you .  Contact us if any weakness fever   Worsening     Piriformis Syndrome  Piriformis syndrome is a condition that can cause pain and numbness in your buttocks and down the back of your leg. Piriformis syndrome happens when the small muscle that connects the base of your spine to your hip (piriformis muscle) presses on the nerve that runs down the back of your leg (sciatic nerve). The piriformis muscle helps your hip rotate and helps to bring your leg back and out. It also helps shift your weight to keep you stable while you are walking. The sciatic nerve runs under or through the piriformis muscle. Damage to the piriformis muscle can cause spasms that put pressure on the nerve below. This causes pain and discomfort while sitting and moving. The pain may  feel as if it begins in the buttock and spreads (radiates) down your hip and thigh. What are the causes? This condition is caused by pressure on the sciatic nerve from the piriformis muscle. The piriformis muscle can get irritated with overuse, especially if other hip muscles are weak and the piriformis muscle has to do extra work. Piriformis syndrome can also occur after an injury, like a fall onto your buttocks. What increases the risk? You are more likely to develop this condition if you:  Are a woman.  Sit for long periods of time.  Are a cyclist.  Have weak buttocks muscles (gluteal muscles). What are the signs or symptoms? Symptoms of this condition include:  Pain, tingling, or numbness that starts in the buttock and runs down the back of your leg (sciatica).  Pain in the groin or thigh area. Your symptoms may get worse:  The longer you sit.  When you walk, run, or climb stairs.  When straining to have a bowel movement. How is this diagnosed? This condition is diagnosed based on your symptoms, medical history, and physical exam.  During the exam, your health care provider may: ? Move your leg into different positions to check for pain. ? Press on the muscles of your hip and buttock to see if that increases your symptoms.  You may also have  tests, including: ? Imaging tests such as X-rays, MRI, or ultrasound. ? Electromyogram (EMG). This test measures electrical signals sent by your nerves into the muscles. ? Nerve conduction study. This test measures how well electrical signals pass through your nerves. How is this treated? This condition may be treated by:  Stopping all activities that cause pain or make your condition worse.  Applying ice or using heat therapy.  Taking medicines to reduce pain and swelling.  Taking a muscle relaxer (muscle relaxant) to stop muscle spasms.  Doing range-of-motion and strengthening exercises (physical therapy) as told by your  health care provider.  Massaging the area.  Having acupuncture.  Getting an injection of medicine in the piriformis muscle. Your health care provider will choose the medicine based on your condition. He or she may inject: ? An anti-inflammatory medicine (steroid) to reduce swelling. ? A numbing medicine (local anesthetic) to block the pain. ? Botulinum toxin. The toxin blocks nerve impulses to specific muscles to reduce muscle tension. In rare cases, you may need surgery to cut the muscle and release pressure on the nerve if other treatments do not work. Follow these instructions at home: Activity  Do not sit for long periods. Get up and walk around every 20 minutes or as often as told by your health care provider. ? When driving long distances, make sure to take frequent stops to get up and stretch.  Use a cushion when you sit on hard surfaces.  Do exercises as told by your health care provider.  Return to your normal activities as told by your health care provider. Ask your health care provider what activities are safe for you. Managing pain, stiffness, and swelling      If directed, apply heat to the affected area as often as told by your health care provider. Use the heat source that your health care provider recommends, such as a moist heat pack or a heating pad. ? Place a towel between your skin and the heat source. ? Leave the heat on for 20-30 minutes. ? Remove the heat if your skin turns bright red. This is especially important if you are unable to feel pain, heat, or cold. You may have a greater risk of getting burned.  If directed, put ice on the injured area. ? Put ice in a plastic bag. ? Place a towel between your skin and the bag. ? Leave the ice on for 20 minutes, 2-3 times a day. General instructions  Take over-the-counter and prescription medicines only as told by your health care provider.  Ask your health care provider if the medicine prescribed to you  requires you to avoid driving or using heavy machinery.  You may need to take actions to prevent or treat constipation, such as: ? Drink enough fluid to keep your urine pale yellow. ? Take over-the-counter or prescription medicines. ? Eat foods that are high in fiber, such as beans, whole grains, and fresh fruits and vegetables. ? Limit foods that are high in fat and processed sugars, such as fried or sweet foods.  Keep all follow-up visits as told by your health care provider. This is important. How is this prevented?  Do not sit for longer than 20 minutes at a time. When you sit, choose padded surfaces.  Warm up and stretch before being active.  Cool down and stretch after being active.  Give your body time to rest between periods of activity.  Make sure to use equipment that fits you.  Maintain physical fitness, including: ? Strength. ? Flexibility. Contact a health care provider if:  Your pain and stiffness continue or get worse.  Your leg or hip becomes weak.  You have changes in your bowel function or bladder function. Summary  Piriformis syndrome is a condition that can cause pain, tingling, and numbness in your buttocks and down the back of your leg.  You may try applying heat or ice to relieve the pain.  Do not sit for long periods. Get up and walk around every 20 minutes or as often as told by your health care provider. This information is not intended to replace advice given to you by your health care provider. Make sure you discuss any questions you have with your health care provider. Document Released: 03/28/2005 Document Revised: 07/19/2018 Document Reviewed: 11/22/2017 Elsevier Patient Education  [place]: There is no acute fracture or dislocation. Mild to moderate arthritic changes of the hips bilaterally. The bones are osteopenic. There is degenerative changes of the SI joints as well as degenerative changes of the visualized  lower lumbar spine. Bilateral tubal ligation clips noted. The soft tissues are unremarkable.  IMPRESSION: 1. No acute fracture or dislocation. 2. Mild to moderate arthritic changes of the hips.   Electronically Signed   By: Anner Crete M.D.   On: 03/11/2019 15:22    Standley Brooking.  M.D.

## 2019-03-11 ENCOUNTER — Ambulatory Visit (INDEPENDENT_AMBULATORY_CARE_PROVIDER_SITE_OTHER): Payer: Medicare Other | Admitting: Internal Medicine

## 2019-03-11 ENCOUNTER — Other Ambulatory Visit: Payer: Self-pay

## 2019-03-11 ENCOUNTER — Encounter: Payer: Self-pay | Admitting: Internal Medicine

## 2019-03-11 ENCOUNTER — Ambulatory Visit (INDEPENDENT_AMBULATORY_CARE_PROVIDER_SITE_OTHER): Payer: Medicare Other

## 2019-03-11 VITALS — BP 130/72 | HR 76 | Temp 97.8°F | Wt 248.4 lb

## 2019-03-11 DIAGNOSIS — M25551 Pain in right hip: Secondary | ICD-10-CM

## 2019-03-11 DIAGNOSIS — M7918 Myalgia, other site: Secondary | ICD-10-CM

## 2019-03-11 DIAGNOSIS — Z23 Encounter for immunization: Secondary | ICD-10-CM | POA: Diagnosis not present

## 2019-03-11 MED ORDER — MELOXICAM 7.5 MG PO TABS: 7.5000 mg | ORAL_TABLET | Freq: Every day | ORAL | 0 refills | Status: DC

## 2019-03-11 NOTE — Patient Instructions (Addendum)
This could be  Hip soft tissue  Arthritis  Mild  or even  Sciatica  Where the nerve  goes through the pyriformis   Muscles.   This can get better on its own with slow recovery  tylneol 2-3 x per day can try otc   patches and avoid prolonged sitting     If not better in 1-2 weeks then n consdier getting   Ortho pedics to see you .  Contact us if any weakness fever   Worsening     Piriformis Syndrome  Piriformis syndrome is a condition that can cause pain and numbness in your buttocks and down the back of your leg. Piriformis syndrome happens when the small muscle that connects the base of your spine to your hip (piriformis muscle) presses on the nerve that runs down the back of your leg (sciatic nerve). The piriformis muscle helps your hip rotate and helps to bring your leg back and out. It also helps shift your weight to keep you stable while you are walking. The sciatic nerve runs under or through the piriformis muscle. Damage to the piriformis muscle can cause spasms that put pressure on the nerve below. This causes pain and discomfort while sitting and moving. The pain may feel as if it begins in the buttock and spreads (radiates) down your hip and thigh. What are the causes? This condition is caused by pressure on the sciatic nerve from the piriformis muscle. The piriformis muscle can get irritated with overuse, especially if other hip muscles are weak and the piriformis muscle has to do extra work. Piriformis syndrome can also occur after an injury, like a fall onto your buttocks. What increases the risk? You are more likely to develop this condition if you:  Are a woman.  Sit for long periods of time.  Are a cyclist.  Have weak buttocks muscles (gluteal muscles). What are the signs or symptoms? Symptoms of this condition include:  Pain, tingling, or numbness that starts in the buttock and runs down the back of your leg (sciatica).  Pain in the groin or thigh area. Your symptoms  may get worse:  The longer you sit.  When you walk, run, or climb stairs.  When straining to have a bowel movement. How is this diagnosed? This condition is diagnosed based on your symptoms, medical history, and physical exam.  During the exam, your health care provider may: ? Move your leg into different positions to check for pain. ? Press on the muscles of your hip and buttock to see if that increases your symptoms.  You may also have tests, including: ? Imaging tests such as X-rays, MRI, or ultrasound. ? Electromyogram (EMG). This test measures electrical signals sent by your nerves into the muscles. ? Nerve conduction study. This test measures how well electrical signals pass through your nerves. How is this treated? This condition may be treated by:  Stopping all activities that cause pain or make your condition worse.  Applying ice or using heat therapy.  Taking medicines to reduce pain and swelling.  Taking a muscle relaxer (muscle relaxant) to stop muscle spasms.  Doing range-of-motion and strengthening exercises (physical therapy) as told by your health care provider.  Massaging the area.  Having acupuncture.  Getting an injection of medicine in the piriformis muscle. Your health care provider will choose the medicine based on your condition. He or she may inject: ? An anti-inflammatory medicine (steroid) to reduce swelling. ? A numbing medicine (local anesthetic)  to block the pain. ? Botulinum toxin. The toxin blocks nerve impulses to specific muscles to reduce muscle tension. In rare cases, you may need surgery to cut the muscle and release pressure on the nerve if other treatments do not work. Follow these instructions at home: Activity  Do not sit for long periods. Get up and walk around every 20 minutes or as often as told by your health care provider. ? When driving long distances, make sure to take frequent stops to get up and stretch.  Use a cushion  when you sit on hard surfaces.  Do exercises as told by your health care provider.  Return to your normal activities as told by your health care provider. Ask your health care provider what activities are safe for you. Managing pain, stiffness, and swelling      If directed, apply heat to the affected area as often as told by your health care provider. Use the heat source that your health care provider recommends, such as a moist heat pack or a heating pad. ? Place a towel between your skin and the heat source. ? Leave the heat on for 20-30 minutes. ? Remove the heat if your skin turns bright red. This is especially important if you are unable to feel pain, heat, or cold. You may have a greater risk of getting burned.  If directed, put ice on the injured area. ? Put ice in a plastic bag. ? Place a towel between your skin and the bag. ? Leave the ice on for 20 minutes, 2-3 times a day. General instructions  Take over-the-counter and prescription medicines only as told by your health care provider.  Ask your health care provider if the medicine prescribed to you requires you to avoid driving or using heavy machinery.  You may need to take actions to prevent or treat constipation, such as: ? Drink enough fluid to keep your urine pale yellow. ? Take over-the-counter or prescription medicines. ? Eat foods that are high in fiber, such as beans, whole grains, and fresh fruits and vegetables. ? Limit foods that are high in fat and processed sugars, such as fried or sweet foods.  Keep all follow-up visits as told by your health care provider. This is important. How is this prevented?  Do not sit for longer than 20 minutes at a time. When you sit, choose padded surfaces.  Warm up and stretch before being active.  Cool down and stretch after being active.  Give your body time to rest between periods of activity.  Make sure to use equipment that fits you.  Maintain physical fitness,  including: ? Strength. ? Flexibility. Contact a health care provider if:  Your pain and stiffness continue or get worse.  Your leg or hip becomes weak.  You have changes in your bowel function or bladder function. Summary  Piriformis syndrome is a condition that can cause pain, tingling, and numbness in your buttocks and down the back of your leg.  You may try applying heat or ice to relieve the pain.  Do not sit for long periods. Get up and walk around every 20 minutes or as often as told by your health care provider. This information is not intended to replace advice given to you by your health care provider. Make sure you discuss any questions you have with your health care provider. Document Released: 03/28/2005 Document Revised: 07/19/2018 Document Reviewed: 11/22/2017 Elsevier Patient Education  [place]: There is  no acute fracture or dislocation. Mild to moderate arthritic changes of the hips bilaterally. The bones are osteopenic. There is degenerative changes of the SI joints as well as degenerative changes of the visualized lower lumbar spine. Bilateral tubal ligation clips noted. The soft tissues are unremarkable.  IMPRESSION: 1. No acute fracture or dislocation. 2. Mild to moderate arthritic changes of the hips.   Electronically Signed   By: Anner Crete M.D.   On: 03/11/2019 15:22

## 2019-03-12 ENCOUNTER — Ambulatory Visit
Admission: RE | Admit: 2019-03-12 | Discharge: 2019-03-12 | Disposition: A | Discharge status: Home / Self Care | Payer: Medicare Other | Source: Ambulatory Visit | Attending: Internal Medicine | Admitting: Internal Medicine

## 2019-03-12 DIAGNOSIS — Z1231 Encounter for screening mammogram for malignant neoplasm of breast: Secondary | ICD-10-CM

## 2019-03-18 ENCOUNTER — Telehealth: Payer: Self-pay

## 2019-03-18 MED ORDER — IBUPROFEN 800 MG PO TABS: 800.0000 mg | ORAL_TABLET | Freq: Two times a day (BID) | ORAL | 0 refills | Status: DC | PRN

## 2019-03-18 NOTE — Telephone Encounter (Signed)
Copied from [place] [id number]. Topic: General - Other >> Mar 18, 2019 11:46 AM Leward Quan A wrote: Reason for CRM: Patient called to say that she is still having the hip pain and that she is afraid to take the meloxicam (MOBIC) 7.5 MG tablet due to the side effects. Also say that her sister took it and ended up in the hospital so she is asking if Dr Regis Bill can please send and Rx to the pharmacy for Ibuprofen 800 MG please. Patient can be reached at Ph# [phone]

## 2019-03-18 NOTE — Addendum Note (Signed)
Addended byBurnis Medin on: 03/18/2019 06:13 PM   Modules accepted: Orders

## 2019-03-18 NOTE — Telephone Encounter (Signed)
So ibuprofen and mobic are similar  For side effects but we could try ibuprofen first   Problems with kidneys and stomach. But sending in to [facility] to try .

## 2019-03-18 NOTE — Telephone Encounter (Signed)
Please advise 

## 2019-03-19 NOTE — Telephone Encounter (Signed)
Pt notified that this has been sent in

## 2019-04-16 ENCOUNTER — Ambulatory Visit
Admission: RE | Admit: 2019-04-16 | Discharge: 2019-04-16 | Disposition: A | Discharge status: Home / Self Care | Payer: Medicare Other | Source: Ambulatory Visit | Attending: Internal Medicine | Admitting: Internal Medicine

## 2019-04-16 ENCOUNTER — Telehealth: Payer: Self-pay | Admitting: Internal Medicine

## 2019-04-16 ENCOUNTER — Other Ambulatory Visit: Payer: Self-pay

## 2019-04-16 DIAGNOSIS — Z780 Asymptomatic menopausal state: Secondary | ICD-10-CM | POA: Diagnosis not present

## 2019-04-16 DIAGNOSIS — E2839 Other primary ovarian failure: Secondary | ICD-10-CM

## 2019-04-16 NOTE — Telephone Encounter (Signed)
Form placed in red folder  

## 2019-04-16 NOTE — Telephone Encounter (Signed)
Patient dropped off disability placard.  Mail forms to [place] [facility] [address] [place],  [id number]  Disposition: Drs folder

## 2019-04-16 NOTE — Telephone Encounter (Signed)
Form has been mailed out

## 2019-04-19 ENCOUNTER — Telehealth: Payer: Self-pay | Admitting: Internal Medicine

## 2019-04-19 DIAGNOSIS — M25562 Pain in left knee: Secondary | ICD-10-CM

## 2019-04-19 NOTE — Telephone Encounter (Signed)
Please advise 

## 2019-04-19 NOTE — Telephone Encounter (Signed)
The patent is having left knee pain and was wondering if Dr. Regis Bill can write her a Rx for a knee brace since Dr. Carter Kitten knows that she has knee pain.  Please Advise

## 2019-04-23 NOTE — Telephone Encounter (Signed)
So I don't  prescribe knee braces . Because there a re many kinds and many dont help .  I defer knee brace prescriptions to the orthopedist  Specialists  May we do a referral to ortho for knee pain ? To assess fo you

## 2019-04-23 NOTE — Addendum Note (Signed)
Addended by: Modena Morrow R on: 04/23/2019 10:14 AM   Modules accepted: Orders

## 2019-04-23 NOTE — Telephone Encounter (Signed)
Pt has been noified and referral has been placed per patients request

## 2019-05-02 ENCOUNTER — Ambulatory Visit: Payer: Medicare Other | Admitting: Orthopaedic Surgery

## 2019-05-02 ENCOUNTER — Other Ambulatory Visit: Payer: Self-pay

## 2019-05-02 ENCOUNTER — Encounter: Payer: Self-pay | Admitting: Orthopaedic Surgery

## 2019-05-02 ENCOUNTER — Ambulatory Visit (INDEPENDENT_AMBULATORY_CARE_PROVIDER_SITE_OTHER): Payer: Medicare Other

## 2019-05-02 DIAGNOSIS — M1712 Unilateral primary osteoarthritis, left knee: Secondary | ICD-10-CM

## 2019-05-02 DIAGNOSIS — M25562 Pain in left knee: Secondary | ICD-10-CM

## 2019-05-02 DIAGNOSIS — G8929 Other chronic pain: Secondary | ICD-10-CM

## 2019-05-02 MED ORDER — BUPIVACAINE HCL 0.25 % IJ SOLN
2.0000 mL | INTRAMUSCULAR | Status: AC | PRN
  Administered 2019-05-02: 2 mL via INTRA_ARTICULAR

## 2019-05-02 MED ORDER — METHYLPREDNISOLONE ACETATE 40 MG/ML IJ SUSP
40.0000 mg | INTRAMUSCULAR | Status: AC | PRN
  Administered 2019-05-02: 40 mg via INTRA_ARTICULAR

## 2019-05-02 MED ORDER — LIDOCAINE HCL 1 % IJ SOLN
2.0000 mL | INTRAMUSCULAR | Status: AC | PRN
  Administered 2019-05-02: 2 mL

## 2019-05-02 NOTE — Progress Notes (Signed)
Office Visit Note    Patient: Nina Le           Date of Birth: [birth year]           MRN: [id number] Visit Date: 05/02/2019              Requested by: Burnis Medin, MD [place],  [place] [id number] PCP: Burnis Medin, MD   Assessment & Plan: Visit Diagnoses:  1. Unilateral primary osteoarthritis, left knee   2. Chronic pain of left knee     Plan: Impression is advanced generative joint disease left knee.  We will inject the left knee with cortisone today.  I will also provided the patient with a viscosupplementation handout.  She will make all efforts at weight loss to get to a BMI of under 40 before proceeding with definitive treatment of a total knee replacement.  We will also put in a referral for a venous Doppler ultrasound left lower extremity to rule out chronic DVT.  She will follow-up with [place] as needed.  Follow-Up Instructions: Return if symptoms worsen or fail to improve.   Orders:  Orders Placed This Encounter  Procedures  . Large Joint Inj: L knee  . XR KNEE 3 VIEW LEFT   No orders of the defined types were placed in this encounter.     Procedures: Large Joint Inj: L knee on 05/02/2019 10:56 AM Indications: pain Details: 22 G needle, anterolateral approach Medications: 2 mL lidocaine 1 %; 2 mL bupivacaine 0.25 %; 40 mg methylPREDNISolone acetate 40 MG/ML      Clinical Data: No additional findings.   Subjective: Chief Complaint  Patient presents with  . Left Knee - Pain    HPI patient is a pleasant 79 year old female who comes in today with left knee pain.  This has been ongoing for the past year and is progressively worsened.  Pain she has is throughout the entire knee.  She does admit to pain in the calf which is also been ongoing for about a year.  The knee pain is aggravated going from a seated to standing position.  Occasionally has pain at night.  She has been taking Tylenol without relief of symptoms.  She does note  to previous cortisone injections over 5 years ago which she thinks helped.  Of note, no chest pain or shortness of breath.  No history of DVT or PE.  Review of Systems as detailed in HPI.  All others reviewed and are negative.   Objective: Vital Signs: There were no vitals taken for this visit.  Physical Exam well-developed well-nourished female no acute distress.  Alert and oriented x3.  Ortho Exam examination of her left knee she has a trace effusion.  Range of motion 0 to 110 degrees.  Medial and lateral joint line tenderness.  Moderate patellofemoral crepitus.  She is neurovascular intact distally.  Calf is moderately tender.  No swelling and no erythema.  Negative Homans.  Specialty Comments:  No specialty comments available.  Imaging: XR KNEE 3 VIEW LEFT  Result Date: 05/02/2019 X-rays demonstrate marked tricompartmental degenerative changes    PMFS History: Patient Active Problem List   Diagnosis Date Noted  . Essential hypertension 03/27/2014  . Arthritis of knee 12/26/2013  . Unspecified venous (peripheral) insufficiency 12/26/2013  . Hyperglycemia 12/03/2013  . Severe obesity (BMI >= 40) ([place]) 01/17/2013  . Medication side effect 01/09/2013  . Hx of abnormal mammogram 10/25/2011  . Knee pain, right 10/25/2011  .  Diastolic dysfunction   . TIREDNESS 03/08/2010  . COUGH 02/23/2010  . HYPERLIPIDEMIA 02/28/2008  . OBESITY 02/28/2008  . MITRAL REGURGITATION, MILD 02/28/2008  . HYPERTENSION 11/16/2006   Past Medical History:  Diagnosis Date  . Acute back pain 03/12/2011  . Alkaline phosphatase elevation    hx-neg lfts  . Bacterial endocarditis 2006  . Diastolic dysfunction    Mild Echo  . Hyperlipidemia   . Hypertension   . Low back pain     Family History  Problem Relation Age of Onset  . Arthritis Mother   . Hypertension Mother   . Breast cancer Mother        cancer in the blood  . Heart disease Father   . Breast cancer Sister   . Colon cancer Neg Hx       Past Surgical History:  Procedure Laterality Date  . CARDIAC CATHETERIZATION     nl coronary 2006   . COLONOSCOPY    . KNEE SURGERY     Rt  . SPINE SURGERY  2006   Dr. Lorin Mercy  . US ECHOCARDIOGRAPHY     mild mitral regurgitation   Social History   Occupational History  . Not on file  Tobacco Use  . Smoking status: Never Smoker  . Smokeless tobacco: Never Used  Substance and Sexual Activity  . Alcohol use: No  . Drug use: No  . Sexual activity: Not on file

## 2019-05-08 ENCOUNTER — Other Ambulatory Visit: Payer: Self-pay

## 2019-05-08 ENCOUNTER — Ambulatory Visit (HOSPITAL_COMMUNITY)
Admission: RE | Admit: 2019-05-08 | Discharge: 2019-05-08 | Disposition: A | Discharge status: Home / Self Care | Payer: Medicare Other | Source: Ambulatory Visit | Attending: Orthopaedic Surgery | Admitting: Orthopaedic Surgery

## 2019-05-08 DIAGNOSIS — M25562 Pain in left knee: Secondary | ICD-10-CM | POA: Diagnosis not present

## 2019-05-08 DIAGNOSIS — G8929 Other chronic pain: Secondary | ICD-10-CM | POA: Diagnosis not present

## 2019-05-08 DIAGNOSIS — M1712 Unilateral primary osteoarthritis, left knee: Secondary | ICD-10-CM

## 2019-05-08 NOTE — Progress Notes (Signed)
Left lower extremity venous duplex completed.  Preliminary results can be found under CV proc under chart review.  05/08/2019 10:38 AM  Hamzah Savoca, K., RDMS, RVT

## 2019-05-08 NOTE — Progress Notes (Signed)
Negative for DVT

## 2019-05-20 ENCOUNTER — Encounter: Payer: Self-pay | Admitting: Internal Medicine

## 2019-05-20 ENCOUNTER — Other Ambulatory Visit: Payer: Self-pay

## 2019-05-20 ENCOUNTER — Ambulatory Visit (INDEPENDENT_AMBULATORY_CARE_PROVIDER_SITE_OTHER): Payer: Medicare Other | Admitting: Internal Medicine

## 2019-05-20 VITALS — BP 128/74 | HR 69 | Temp 97.2°F | Ht 65.5 in | Wt 248.0 lb

## 2019-05-20 DIAGNOSIS — E785 Hyperlipidemia, unspecified: Secondary | ICD-10-CM

## 2019-05-20 DIAGNOSIS — Z79899 Other long term (current) drug therapy: Secondary | ICD-10-CM | POA: Diagnosis not present

## 2019-05-20 DIAGNOSIS — M1710 Unilateral primary osteoarthritis, unspecified knee: Secondary | ICD-10-CM

## 2019-05-20 DIAGNOSIS — I10 Essential (primary) hypertension: Secondary | ICD-10-CM

## 2019-05-20 DIAGNOSIS — R739 Hyperglycemia, unspecified: Secondary | ICD-10-CM

## 2019-05-20 DIAGNOSIS — Z0000 Encounter for general adult medical examination without abnormal findings: Secondary | ICD-10-CM | POA: Diagnosis not present

## 2019-05-20 DIAGNOSIS — M179 Osteoarthritis of knee, unspecified: Secondary | ICD-10-CM

## 2019-05-20 LAB — CBC WITH DIFFERENTIAL/PLATELET
Basophils Absolute: 0.1 10*3/uL (ref 0.0–0.1)
Basophils Relative: 1.5 % (ref 0.0–3.0)
Eosinophils Absolute: 0 10*3/uL (ref 0.0–0.7)
Eosinophils Relative: 0.3 % (ref 0.0–5.0)
HCT: 41.8 % (ref 36.0–46.0)
Hemoglobin: 13.5 g/dL (ref 12.0–15.0)
Lymphocytes Relative: 27.2 % (ref 12.0–46.0)
Lymphs Abs: 2.5 10*3/uL (ref 0.7–4.0)
MCHC: 32.3 g/dL (ref 30.0–36.0)
MCV: 85.3 fl (ref 78.0–100.0)
Monocytes Absolute: 0.6 10*3/uL (ref 0.1–1.0)
Monocytes Relative: 6.8 % (ref 3.0–12.0)
Neutro Abs: 5.8 10*3/uL (ref 1.4–7.7)
Neutrophils Relative %: 64.2 % (ref 43.0–77.0)
Platelets: 251 10*3/uL (ref 150.0–400.0)
RBC: 4.89 Mil/uL (ref 3.87–5.11)
RDW: 14.1 % (ref 11.5–15.5)
WBC: 9 10*3/uL (ref 4.0–10.5)

## 2019-05-20 LAB — LIPID PANEL
Cholesterol: 221 mg/dL — ABNORMAL HIGH (ref 0–200)
HDL: 48.5 mg/dL (ref >39.00)
LDL Cholesterol: 161 mg/dL — ABNORMAL HIGH (ref 0–99)
NonHDL: 172.07
Total CHOL/HDL Ratio: 5
Triglycerides: 56 mg/dL (ref 0.0–149.0)
VLDL: 11.2 mg/dL (ref 0.0–40.0)

## 2019-05-20 LAB — BASIC METABOLIC PANEL
BUN: 21 mg/dL (ref 6–23)
CO2: 29 mEq/L (ref 19–32)
Calcium: 9.6 mg/dL (ref 8.4–10.5)
Chloride: 102 mEq/L (ref 96–112)
Creatinine, Ser: 0.72 mg/dL (ref 0.40–1.20)
GFR: 95.1 mL/min (ref >60.00)
Glucose, Bld: 96 mg/dL (ref 70–99)
Potassium: 4 mEq/L (ref 3.5–5.1)
Sodium: 139 mEq/L (ref 135–145)

## 2019-05-20 LAB — HEPATIC FUNCTION PANEL
ALT: 9 U/L (ref 0–35)
AST: 13 U/L (ref 0–37)
Albumin: 4 g/dL (ref 3.5–5.2)
Alkaline Phosphatase: 101 U/L (ref 39–117)
Bilirubin, Direct: 0.1 mg/dL (ref 0.0–0.3)
Total Bilirubin: 0.3 mg/dL (ref 0.2–1.2)
Total Protein: 7.3 g/dL (ref 6.0–8.3)

## 2019-05-20 LAB — HEMOGLOBIN A1C: Hgb A1c MFr Bld: 5.9 % (ref 4.6–6.5)

## 2019-05-20 MED ORDER — IBUPROFEN 800 MG PO TABS: 800.0000 mg | ORAL_TABLET | Freq: Two times a day (BID) | ORAL | 0 refills | Status: DC | PRN

## 2019-05-20 NOTE — Patient Instructions (Addendum)
Caution with ibuprofen but  Ok  To take if needed .  Will notify you  of labs when available.   Let us know if want to proceed with physical therapy  Referral to see oif can help muscle strength around knee that can help  Pain .   continue working on healthy weight control  To  Take the load off of the knee.    Glad you are getting  The covid vaccine    Health Maintenance, Female Adopting a healthy lifestyle and getting preventive care are important in promoting health and wellness. Ask your health care provider about:  The right schedule for you to have regular tests and exams.  Things you can do on your own to prevent diseases and keep yourself healthy. What should I know about diet, weight, and exercise? Eat a healthy diet   Eat a diet that includes plenty of vegetables, fruits, low-fat dairy products, and lean protein.  Do not eat a lot of foods that are high in solid fats, added sugars, or sodium. Maintain a healthy weight Body mass index (BMI) is used to identify weight problems. It estimates body fat based on height and weight. Your health care provider can help determine your BMI and help you achieve or maintain a healthy weight. Get regular exercise Get regular exercise. This is one of the most important things you can do for your health. Most adults should:  Exercise for at least 150 minutes each week. The exercise should increase your heart rate and make you sweat (moderate-intensity exercise).  Do strengthening exercises at least twice a week. This is in addition to the moderate-intensity exercise.  Spend less time sitting. Even light physical activity can be beneficial. Watch cholesterol and blood lipids Have your blood tested for lipids and cholesterol at 77 years of age, then have this test every 5 years. Have your cholesterol levels checked more often if:  Your lipid or cholesterol levels are high.  You are older than 77 years of age.  You are at high risk  for heart disease. What should I know about cancer screening? Depending on your health history and family history, you may need to have cancer screening at various ages. This may include screening for:  Breast cancer.  Cervical cancer.  Colorectal cancer.  Skin cancer.  Lung cancer. What should I know about heart disease, diabetes, and high blood pressure? Blood pressure and heart disease  High blood pressure causes heart disease and increases the risk of stroke. This is more likely to develop in people who have high blood pressure readings, are of African descent, or are overweight.  Have your blood pressure checked: ? Every 3-5 years if you are 32-107 years of age. ? Every year if you are 58 years old or older. Diabetes Have regular diabetes screenings. This checks your fasting blood sugar level. Have the screening done:  Once every three years after age 37 if you are at a normal weight and have a low risk for diabetes.  More often and at a younger age if you are overweight or have a high risk for diabetes. What should I know about preventing infection? Hepatitis B If you have a higher risk for hepatitis B, you should be screened for this virus. Talk with your health care provider to find out if you are at risk for hepatitis B infection. Hepatitis C Testing is recommended for:  Everyone born from [birth year] through 1963.  Anyone with known risk factors for  hepatitis C. Sexually transmitted infections (STIs)  Get screened for STIs, including gonorrhea and chlamydia, if: ? You are sexually active and are younger than 77 years of age. ? You are older than 77 years of age and your health care provider tells you that you are at risk for this type of infection. ? Your sexual activity has changed since you were last screened, and you are at increased risk for chlamydia or gonorrhea. Ask your health care provider if you are at risk.  Ask your health care provider about whether you are  at high risk for HIV. Your health care provider may recommend a prescription medicine to help prevent HIV infection. If you choose to take medicine to prevent HIV, you should first get tested for HIV. You should then be tested every 3 months for as long as you are taking the medicine. Pregnancy  If you are about to stop having your period (premenopausal) and you may become pregnant, seek counseling before you get pregnant.  Take 400 to 800 micrograms (mcg) of folic acid every day if you become pregnant.  Ask for birth control (contraception) if you want to prevent pregnancy. Osteoporosis and menopause Osteoporosis is a disease in which the bones lose minerals and strength with aging. This can result in bone fractures. If you are 84 years old or older, or if you are at risk for osteoporosis and fractures, ask your health care provider if you should:  Be screened for bone loss.  Take a calcium or vitamin D supplement to lower your risk of fractures.  Be given hormone replacement therapy (HRT) to treat symptoms of menopause. Follow these instructions at home: Lifestyle  Do not use any products that contain nicotine or tobacco, such as cigarettes, e-cigarettes, and chewing tobacco. If you need help quitting, ask your health care provider.  Do not use street drugs.  Do not share needles.  Ask your health care provider for help if you need support or information about quitting drugs. Alcohol use  Do not drink alcohol if: ? Your health care provider tells you not to drink. ? You are pregnant, may be pregnant, or are planning to become pregnant.  If you drink alcohol: ? Limit how much you use to 0-1 drink a day. ? Limit intake if you are breastfeeding.  Be aware of how much alcohol is in your drink. In the [place] one drink equals one 12 oz bottle of beer (355 mL), one 5 oz glass of wine (148 mL), or one 1 oz glass of hard liquor (44 mL). General instructions  Schedule regular health,  dental, and eye exams.  Stay current with your vaccines.  Tell your health care provider if: ? You often feel depressed. ? You have ever been abused or do not feel safe at home. Summary  Adopting a healthy lifestyle and getting preventive care are important in promoting health and wellness.  Follow your health care provider's instructions about healthy diet, exercising, and getting tested or screened for diseases.  Follow your health care provider's instructions on monitoring your cholesterol and blood pressure. This information is not intended to replace advice given to you by your health care provider. Make sure you discuss any questions you have with your health care provider. Document Revised: 03/21/2018 Document Reviewed: 03/21/2018 Elsevier Patient Education  2020 [employer].

## 2019-05-20 NOTE — Progress Notes (Signed)
This visit occurred during the SARS-CoV-2 public health emergency.  Safety protocols were in place, including screening questions prior to the visit, additional usage of staff PPE, and extensive cleaning of exam room while observing appropriate contact time as indicated for disinfecting solutions.    Chief Complaint  Patient presents with  . Annual Exam    HPI: Nina Le 79 y.o. comes in today for Preventive Medicare exa/ wellness visit .Since last visit.   Seen ortho about knee and leg   tricom-partment djd and rx stserois injection  Still bothersome although has a little bit better mobility Taking ibuprofen only when he needs it Tylenol every day would like a refill of the ibuprofen does not take it regularly is aware. Blood pressure has been in control no change medication Had some soreness on the left leg when they did the Doppler test but no blood clot was noted.  Health Maintenance  Topic Date Due  . TETANUS/TDAP  04/11/2014  . INFLUENZA VACCINE  Completed  . DEXA SCAN  Completed  . PNA vac Low Risk Adult  Completed   Health Maintenance Review LIFESTYLE:  Exercise:  Limited knee   Walking .  Tobacco/ETS:n Alcohol: n Sugar beverages: ginger ale  Sleep: 6-7 Drug use: no HH:1 no pets  Daughter lives in [place].     Hearing: fine   Vision:  No limitations at present . Last eye check UTD yearly   Safety:  Has smoke detector and wears seat belts. No excess sun exposure.  Has not seen dentist regularly.  Recently because of Covid but no concerns  Falls:  no  Memory: Felt to be good  , no concern from her or her family.  Depression: No anhedonia unusual crying or depressive symptoms  Nutrition: Eats well balanced diet; adequate calcium and vitamin D. No swallowing chewing problems.  Injury: no major injuries in the last six months.  Other healthcare providers:  Reviewed today .  Preventive : up-to-date  Reviewed   ADLS:   There are no problems or  need for assistance  driving, feeding, obtaining food, dressing, toileting and bathing, managing money using phone. She is independent.  But does have problems with mobility with her arthritis.  Is up-to-date on mammogram.    ROS: States that her left hand feels cold all the time does not turn colors denies numbness or weakness she is left-handed but uses her right a good bit also. GEN/ HEENT: No fever, significant weight changes sweats headaches vision problems hearing changes, CV/ PULM; No chest pain shortness of breath cough, syncope,edema  change in exercise tolerance. GI /GU: No adominal pain, vomiting, change in bowel habits. No blood in the stool. No significant GU symptoms. SKIN/HEME: ,no acute skin rashes suspicious lesions or bleeding. No lymphadenopathy, nodules, masses.  NEURO/ PSYCH:  No neurologic signs such as weakness numbness. No depression anxiety. IMM/ Allergy: No unusual infections.  Allergy .   REST of 12 system review negative except as per HPI   Past Medical History:  Diagnosis Date  . Acute back pain 03/12/2011  . Alkaline phosphatase elevation    hx-neg lfts  . Bacterial endocarditis 2006  . Diastolic dysfunction    Mild Echo  . Hyperlipidemia   . Hypertension   . Low back pain     Family History  Problem Relation Age of Onset  . Arthritis Mother   . Hypertension Mother   . Breast cancer Mother        cancer in  the blood  . Heart disease Father   . Breast cancer Sister   . Colon cancer Neg Hx     Social History   Socioeconomic History  . Marital status: Married    Spouse name: Not on file  . Number of children: Not on file  . Years of education: Not on file  . Highest education level: Not on file  Occupational History  . Not on file  Tobacco Use  . Smoking status: Never Smoker  . Smokeless tobacco: Never Used  Substance and Sexual Activity  . Alcohol use: No  . Drug use: No  . Sexual activity: Not on file  Other Topics Concern  . 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 Difficulty of Paying Living Expenses: Not on file  Food Insecurity:   . Worried About Charity fundraiser in the Last Year: Not on file  . Ran Out of Food in the Last Year: Not on file  Transportation Needs:   . Lack of Transportation (Medical): Not on file  . Lack of Transportation (Non-Medical): Not on file  Physical Activity:   . Days of Exercise per Week: Not on file  . Minutes of Exercise per Session: Not on file  Stress:   . Feeling of Stress : Not on file  Social Connections:   . Frequency of Communication with Friends and Family: Not on file  . Frequency of Social Gatherings with Friends and Family: Not on file  . Attends Religious Services: Not on file  . Active Member of Clubs or Organizations: Not on file  . Attends Archivist Meetings: Not on file  . Marital Status: Not on file    Outpatient Encounter Medications as of 05/20/2019  Medication Sig  . amLODipine (NORVASC) 10 MG tablet Take 1 tablet by mouth once daily  . Cholecalciferol (VITAMIN D-3 PO) Take 2,000 Units by mouth daily.  Marland Kitchen ibuprofen (ADVIL) 800 MG tablet Take 1 tablet (800 mg total) by mouth 2 (two) times daily as needed. For hip or knee pain  . lisinopril-hydrochlorothiazide (ZESTORETIC) 20-12.5 MG tablet Take 1 tablet by mouth once daily  . Multiple Vitamin (MULTIVITAMIN) tablet Take 1 tablet by mouth daily.    . [DISCONTINUED] ibuprofen (ADVIL) 800 MG tablet Take 1 tablet (800 mg total) by mouth 2 (two) times daily as needed. For hip pain  . diclofenac sodium (VOLTAREN) 1 % GEL Apply 4 g topically 4 (four) times daily. As needed for knee pain (Patient not taking: Reported on 05/20/2019)   No facility-administered encounter medications  on file as of 05/20/2019.    EXAM:  BP 128/74 (BP Location: Right Arm, Patient Position: Sitting, Cuff Size: Large)   Pulse 69   Temp (!) 97.2 F (36.2 C) (Temporal)   Ht 5' 5.5" (1.664 m)   Wt 248 lb (112.5 kg)   BMI 40.64 kg/m   Body mass index is 40.64 kg/m. Wt Readings from Last 3 Encounters:  05/20/19 248 lb (112.5 kg)  03/11/19 248 lb 6.4 oz (112.7 kg)  05/18/18 256 lb 6.4 oz (116.3 kg)    Physical Exam: Vital signs reviewed [id number] is a well-developed well-nourished alert  cooperative   who appears stated age in no acute distress.  HEENT: normocephalic atraumatic , Eyes: PERRL EOM's full, conjunctiva clear, , Ears: no deformity EAC's clear TMs with normal landmarks. Mouth: clear OP,masked . NECK: supple without masses, thyromegaly or bruits. CHEST/PULM:  Clear to auscultation and percussion breath sounds equal no wheeze , rales or rhonchi. No chest wall deformities or tenderness. CV: PMI is nondisplaced, S1 S2 no gallops, murmurs, rubs. Peripheral pulses are full without delay.No JVD .  ABDOMEN: Bowel sounds normal nontender  No guard or rebound, no hepato splenomegal no CVA tenderness.   Extremtities:  No clubbing cyanosis or edema, no acute joint swelling or redness no focal atrophy  NEURO:  Oriented x3, cranial nerves 3-12 appear to be intact, no obvious focal weakness,gait gait is a bit antalgic but unassisted ambulatory no unsteadiness.  SKIN: No acute rashes normal turgor, color, no bruising or petechiae. PSYCH: Oriented, good eye contact, no obvious depression anxiety, cognition and judgment appear normal. LN: no cervical l adenopathy No noted deficits in memory, attention, and speech.   Lab Results  Component Value Date   WBC 7.8 05/18/2018   HGB 13.3 05/18/2018   HCT 40.0 05/18/2018   PLT 282.0 05/18/2018   GLUCOSE 84 05/18/2018   CHOL 201 (H) 05/18/2018   TRIG 59.0 05/18/2018   HDL 45.60 05/18/2018   LDLCALC 144 (H) 05/18/2018   ALT 14 05/18/2018   AST  15 05/18/2018   NA 140 05/18/2018   K 4.0 05/18/2018   CL 101 05/18/2018   CREATININE 0.80 05/18/2018   BUN 18 05/18/2018   CO2 31 05/18/2018   TSH 1.33 05/16/2016   HGBA1C 5.8 05/18/2018  The BMD measured at Femur Total Left is 1.084 g/cm2 with a T-score of 0.6. This patient is considered normal according to [facility]) criteria.  ASSESSMENT AND PLAN:  Discussed the following assessment and plan:  Visit for preventive health examination  Medication management - Plan: Basic metabolic panel, CBC with Differential/Platelet, Hemoglobin A1c, Hepatic function panel, Lipid panel  Hyperlipidemia, unspecified hyperlipidemia type - Plan: Basic metabolic panel, CBC with Differential/Platelet, Hemoglobin A1c, Hepatic function panel, Lipid panel  Essential hypertension - Plan: Basic metabolic panel, CBC with Differential/Platelet, Hemoglobin A1c, Hepatic function panel, Lipid panel  Severe obesity (BMI >= 40) (HCC) - Plan: Basic metabolic panel, CBC with Differential/Platelet, Hemoglobin A1c, Hepatic function panel, Lipid panel  Hyperglycemia - Plan: Basic metabolic panel, CBC with Differential/Platelet, Hemoglobin A1c, Hepatic function panel, Lipid panel  Osteoarthritis of knee, unspecified laterality, unspecified osteoarthritis type - Plan: Basic metabolic panel, CBC with Differential/Platelet, Hemoglobin A1c, Hepatic function panel, Lipid panel Lab monitoring  Due  Discussed possibility of physical therapy that might help her mobility with her arthritic knee.  She is going to work on some things let us know otherwise our OV in 6 to 12 months depending on status need for use of NSAIDs etc. I can feel the pulses in her left upper extremity no change in capillary refill or color change from the other side will monitor look for alarm symptoms and let me know.  If any progressive symptoms. Return in about 6 months (around 11/17/2019) for depending on results.  Patient Care  Team: Shyler Hamill, Standley Brooking, MD as PCP - General Marybelle Killings, MD (Orthopedic Surgery)  Patient Instructions  Caution with ibuprofen but  Ok  To take if needed .  Will notify you  of labs when available.   Let us know if want  to proceed with physical therapy  Referral to see oif can help muscle strength around knee that can help  Pain .   continue working on healthy weight control  To  Take the load off of the knee.    Glad you are getting  The covid vaccine    Health Maintenance, Female Adopting a healthy lifestyle and getting preventive care are important in promoting health and wellness. Ask your health care provider about:  The right schedule for you to have regular tests and exams.  Things you can do on your own to prevent diseases and keep yourself healthy. What should I know about diet, weight, and exercise? Eat a healthy diet   Eat a diet that includes plenty of vegetables, fruits, low-fat dairy products, and lean protein.  Do not eat a lot of foods that are high in solid fats, added sugars, or sodium. Maintain a healthy weight Body mass index (BMI) is used to identify weight problems. It estimates body fat based on height and weight. Your health care provider can help determine your BMI and help you achieve or maintain a healthy weight. Get regular exercise Get regular exercise. This is one of the most important things you can do for your health. Most adults should:  Exercise for at least 150 minutes each week. The exercise should increase your heart rate and make you sweat (moderate-intensity exercise).  Do strengthening exercises at least twice a week. This is in addition to the moderate-intensity exercise.  Spend less time sitting. Even light physical activity can be beneficial. Watch cholesterol and blood lipids Have your blood tested for lipids and cholesterol at 77 years of age, then have this test every 5 years. Have your cholesterol levels checked more often  if:  Your lipid or cholesterol levels are high.  You are older than 77 years of age.  You are at high risk for heart disease. What should I know about cancer screening? Depending on your health history and family history, you may need to have cancer screening at various ages. This may include screening for:  Breast cancer.  Cervical cancer.  Colorectal cancer.  Skin cancer.  Lung cancer. What should I know about heart disease, diabetes, and high blood pressure? Blood pressure and heart disease  High blood pressure causes heart disease and increases the risk of stroke. This is more likely to develop in people who have high blood pressure readings, are of African descent, or are overweight.  Have your blood pressure checked: ? Every 3-5 years if you are 22-90 years of age. ? Every year if you are 83 years old or older. Diabetes Have regular diabetes screenings. This checks your fasting blood sugar level. Have the screening done:  Once every three years after age 51 if you are at a normal weight and have a low risk for diabetes.  More often and at a younger age if you are overweight or have a high risk for diabetes. What should I know about preventing infection? Hepatitis B If you have a higher risk for hepatitis B, you should be screened for this virus. Talk with your health care provider to find out if you are at risk for hepatitis B infection. Hepatitis C Testing is recommended for:  Everyone born from [birth year] through 1963.  Anyone with known risk factors for hepatitis C. Sexually transmitted infections (STIs)  Get screened for STIs, including gonorrhea and chlamydia, if: ? You are sexually active and are younger than 77 years of  age. ? You are older than 77 years of age and your health care provider tells you that you are at risk for this type of infection. ? Your sexual activity has changed since you were last screened, and you are at increased risk for chlamydia or  gonorrhea. Ask your health care provider if you are at risk.  Ask your health care provider about whether you are at high risk for HIV. Your health care provider may recommend a prescription medicine to help prevent HIV infection. If you choose to take medicine to prevent HIV, you should first get tested for HIV. You should then be tested every 3 months for as long as you are taking the medicine. Pregnancy  If you are about to stop having your period (premenopausal) and you may become pregnant, seek counseling before you get pregnant.  Take 400 to 800 micrograms (mcg) of folic acid every day if you become pregnant.  Ask for birth control (contraception) if you want to prevent pregnancy. Osteoporosis and menopause Osteoporosis is a disease in which the bones lose minerals and strength with aging. This can result in bone fractures. If you are 54 years old or older, or if you are at risk for osteoporosis and fractures, ask your health care provider if you should:  Be screened for bone loss.  Take a calcium or vitamin D supplement to lower your risk of fractures.  Be given hormone replacement therapy (HRT) to treat symptoms of menopause. Follow these instructions at home: Lifestyle  Do not use any products that contain nicotine or tobacco, such as cigarettes, e-cigarettes, and chewing tobacco. If you need help quitting, ask your health care provider.  Do not use street drugs.  Do not share needles.  Ask your health care provider for help if you need support or information about quitting drugs. Alcohol use  Do not drink alcohol if: ? Your health care provider tells you not to drink. ? You are pregnant, may be pregnant, or are planning to become pregnant.  If you drink alcohol: ? Limit how much you use to 0-1 drink a day. ? Limit intake if you are breastfeeding.  Be aware of how much alcohol is in your drink. In the [place] one drink equals one 12 oz bottle of beer (355 mL), one 5 oz  glass of wine (148 mL), or one 1 oz glass of hard liquor (44 mL). General instructions  Schedule regular health, dental, and eye exams.  Stay current with your vaccines.  Tell your health care provider if: ? You often feel depressed. ? You have ever been abused or do not feel safe at home. Summary  Adopting a healthy lifestyle and getting preventive care are important in promoting health and wellness.  Follow your health care provider's instructions about healthy diet, exercising, and getting tested or screened for diseases.  Follow your health care provider's instructions on monitoring your cholesterol and blood pressure. This information is not intended to replace advice given to you by your health care provider. Make sure you discuss any questions you have with your health care provider. Document Revised: 03/21/2018 Document Reviewed: 03/21/2018 Elsevier Patient Education  2020 Baker Ski Polich M.D.

## 2019-05-21 NOTE — Progress Notes (Signed)
Results are normal except the cholesterol is higher than last year    blood sugar and  kidney function is normal   in range    The 10-year ASCVD risk score Nina Le DC Brooke Bonito., et al., 2013) is: 16.6% Continue lifestyle intervention healthy eating and activity   to get the  cholesterol back down   to last years level.

## 2019-08-14 ENCOUNTER — Other Ambulatory Visit: Payer: Self-pay | Admitting: Internal Medicine

## 2019-08-21 ENCOUNTER — Telehealth: Payer: Self-pay | Admitting: Internal Medicine

## 2019-08-21 NOTE — Telephone Encounter (Signed)
The patient's insurance company called the patient today stating that she has not had her physical this year and she had her physical on 05/20/2019. She is needing for Panosh to send information to [employer] to let them know that she had her physical so she can get her gift card.  Please Advise

## 2019-08-21 NOTE — Telephone Encounter (Signed)
She had a preventive health exam in May 20 2019  Send this to whomever can help her with  Getting her  Rewards for doing this

## 2019-08-21 NOTE — Telephone Encounter (Signed)
Last OV 05/20/2019.  Please advise.

## 2019-08-22 ENCOUNTER — Telehealth: Payer: Self-pay | Admitting: Internal Medicine

## 2019-08-22 NOTE — Telephone Encounter (Signed)
Called patient and left a detailed voice message for her to schedule an annual wellness visit.

## 2019-08-22 NOTE — Telephone Encounter (Signed)
Patient called back and stated that she needs a letter stating that she had her Annual Wellness Visit. Can you generate a letter to patient stating that she has had her Wellness visit for this year or does patient need to come back?  If you can do a letter then I can see if she can give me a fax number and then I can fax when I return to work on Monday.  Please advise.

## 2019-08-22 NOTE — Telephone Encounter (Signed)
Called [employer] at [phone] and spoke to [place] and he was not able to provide me a fax number to fax the recent visit with labs for the patient.   I called the patient and let her know and she is going to call her insurance and see if she can get a fax number for me and if there is anything specific that I need to send.

## 2019-08-22 NOTE — Telephone Encounter (Signed)
We did a cpx, preventive visit    Wellness visit is different and related to discussion of topics /questions about health.    Although we covered some of those topics at the visit  not all were included  So would need another visit to do "wellness" to qualify as a wellness visit.

## 2019-08-22 NOTE — Telephone Encounter (Signed)
Transferred pt to [facility].

## 2019-08-22 NOTE — Telephone Encounter (Signed)
Pt is returning your call . She said when you called she was on the telephone with her insurance company  /pt said if she does not answer you can leave her a message

## 2019-08-28 NOTE — Telephone Encounter (Signed)
Called patient and LMOVM to return call  I left a detailed voice message to let patient know to call back to schedule her annual wellness visit.

## 2019-10-10 ENCOUNTER — Telehealth: Payer: Self-pay | Admitting: Internal Medicine

## 2019-10-10 NOTE — Telephone Encounter (Signed)
Left message for patient to schedule Annual Wellness Visit.  Please schedule with Nurse Health Advisor Shannon Crews, RN at [facility]  

## 2019-10-11 ENCOUNTER — Telehealth: Payer: Self-pay | Admitting: Internal Medicine

## 2019-10-11 ENCOUNTER — Other Ambulatory Visit: Payer: Self-pay

## 2019-10-11 MED ORDER — LISINOPRIL-HYDROCHLOROTHIAZIDE 20-12.5 MG PO TABS: 1.0000 | ORAL_TABLET | Freq: Every day | ORAL | 0 refills | Status: DC

## 2019-10-11 MED ORDER — AMLODIPINE BESYLATE 10 MG PO TABS: 10.0000 mg | ORAL_TABLET | Freq: Every day | ORAL | 0 refills | Status: DC

## 2019-10-11 NOTE — Telephone Encounter (Signed)
Called patient and let her know that I have sent in a 15 day refill for her. Patient verbalized an understanding and stated that her daughter is healing well.

## 2019-10-11 NOTE — Telephone Encounter (Signed)
Medication has been sent to the pharmacy requested for 15 days.

## 2019-10-11 NOTE — Telephone Encounter (Signed)
Pt is calling in stating that she is out of town and did not take enough medication due to her not knowing that she was going to be out of town that long with a daughter that had total knee replacement and can not be left alone. Pt is only needing 2 weeks worth of Rx amlodipine 10 MG and lisinopril-hydrochlorothiazide 20-12.5 MG   Pharm:  [facility] [address] in [place], [place] [address]  [phone](p).

## 2019-10-28 ENCOUNTER — Other Ambulatory Visit: Payer: Self-pay

## 2019-10-28 ENCOUNTER — Telehealth: Payer: Self-pay | Admitting: Internal Medicine

## 2019-10-28 MED ORDER — AMLODIPINE BESYLATE 10 MG PO TABS: 10.0000 mg | ORAL_TABLET | Freq: Every day | ORAL | 0 refills | Status: DC

## 2019-10-28 MED ORDER — LISINOPRIL-HYDROCHLOROTHIAZIDE 20-12.5 MG PO TABS: 1.0000 | ORAL_TABLET | Freq: Every day | ORAL | 0 refills | Status: DC

## 2019-10-28 NOTE — Telephone Encounter (Signed)
  amLODipine (NORVASC) 10 MG tablet   lisinopril-hydrochlorothiazide (ZESTORETIC) 20-12.5 MG tablet   [facility] [employer] [address] [place] Phone:  [phone]  Fax:  [phone]

## 2019-10-28 NOTE — Telephone Encounter (Signed)
Medications were sent to the requested pharmacy for the patient.

## 2020-01-02 ENCOUNTER — Ambulatory Visit: Payer: Medicare Other | Admitting: Internal Medicine

## 2020-01-14 NOTE — Progress Notes (Signed)
Chief Complaint  Patient presents with  . Medicare Wellness    Doing okay    HPI: Nina Le 79 y.o. comes in today for Preventive Medicarewellness visit .Since last visit. Feb for  PV  Daughter   Had hip replacement  CV  Father  Had MI about 31 sd    Mom had cancer   Blood cancer type.  Sis had cancer  Breast     Older sis psoriasis  ARTHRITIS   BACK BUTTOCK .   Health Maintenance  Topic Date Due  . Hepatitis C Screening  Never done  . TETANUS/TDAP  04/11/2014  . COVID-19 Vaccine (3 - Booster for Moderna series) 12/20/2019  . INFLUENZA VACCINE  Completed  . DEXA SCAN  Completed  . PNA vac Low Risk Adult  Completed   Health Maintenance Review LIFESTYLE:  Exercise:   Independent  Not  A lot active  Tobacco/ETS: no Alcohol:   no Sugar beverages:little small cokes   1 per day .  Sometimes  Sleep: 7 hours .  Drug use: no HH:  1  No pets    Hearing:  [place]:  No limitations at present . Last eye check UTD    Safety:  Has smoke detector and wears seat belts. Stored safely  firearms. No excess sun exposure. No recenet dentist   Falls: no    Advance directive :  Reviewed has discussed with family but not formal   HO booklet and review given   Memory: Felt to be good  , no concern from her or her family.  Depression: No anhedonia unusual crying or depressive symptoms  Nutrition: Eats well balanced diet; adequate calcium and vitamin D. No swallowing chewing problems. One good  Meal per day     Centrum silver   .  has vit c and e at home .  Injury: no major injuries in the last six months.  Other healthcare providers:  Reviewed today .  Preventive parameters: up-to-date  Reviewed   ADLS:   There are no problems or need for assistance  driving, feeding, obtaining food, dressing, toileting and bathing, managing money using phone.  is independent.    ROS:  GEN/ HEENT: No fever, significant weight changes sweats headaches vision problems hearing  changes, CV/ PULM; No chest pain shortness of breath cough, syncope,edema  change in exercise tolerance. GI /GU: No adominal pain, vomiting, change in bowel habits. No blood in the stool. No significant GU symptoms. SKIN/HEME: ,no acute skin rashes suspicious lesions or bleeding. No lymphadenopathy, nodules, masses.  NEURO/ PSYCH:  No neurologic signs such as weakness numbness. No depression anxiety. IMM/ Allergy: No unusual infections.  Allergy .   REST of 12 system review negative except as per HPI   Past Medical History:  Diagnosis Date  . Acute back pain 03/12/2011  . Alkaline phosphatase elevation    hx-neg lfts  . Bacterial endocarditis 2006  . Diastolic dysfunction    Mild Echo  . Hyperlipidemia   . Hypertension   . Low back pain     Family History  Problem Relation Age of Onset  . Arthritis Mother   . Hypertension Mother   . Breast cancer Mother        cancer in the blood  . Heart disease Father   . Breast cancer Sister   . Colon cancer Neg Hx     Social History   Socioeconomic History  . Marital status: Married  Spouse name: Not on file  . Number of children: Not on file  . Years of education: Not on file  . Highest education level: Not on file  Occupational History  . Not on file  Tobacco Use  . Smoking status: Never Smoker  . Smokeless tobacco: Never Used  Vaping Use  . Vaping Use: Never used  Substance and Sexual Activity  . Alcohol use: No  . Drug use: No  . Sexual activity: Not on file  Other Topics Concern  . 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 amLODipine (NORVASC) 10 MG tablet Take 1 tablet (10 mg total) by mouth daily.  . Cholecalciferol (VITAMIN D-3 PO) Take 2,000 Units by mouth daily.  Marland Kitchen ibuprofen (ADVIL) 800 MG tablet Take 1 tablet (800 mg total) by mouth 2 (two) times daily as needed. For hip or knee pain  . lisinopril-hydrochlorothiazide (ZESTORETIC) 20-12.5 MG tablet Take 1 tablet by mouth daily.  . Multiple Vitamin (MULTIVITAMIN) tablet Take 1 tablet by mouth daily.    . diclofenac sodium (VOLTAREN) 1 % GEL Apply 4 g topically 4 (four) times daily. As needed for knee pain (Patient not taking: Reported on 05/20/2019)   No facility-administered encounter medications on file as of 01/15/2020.    EXAM:  BP 120/76   Pulse 70   Temp 98 F (36.7 C) (Oral)   Ht 5\' 4"  (1.626 m)   Wt 244 lb 9.6 oz (110.9 kg)   SpO2 93%   BMI 41.99 kg/m   Body mass index is 41.99 kg/m.  Physical Exam: Vital signs reviewed WIO:XBDZ is a well-developed well-nourished alert cooperative   who appears stated age in no acute distress.  PSYCH: Oriented, good eye contact, no obvious depression anxiety, cognition and judgment appear normal. No noted deficits in memory, attention, and speech. Wt Readings from Last 3 Encounters:  01/15/20 244 lb 9.6 oz (110.9 kg)  05/20/19 248 lb (112.5 kg)  03/11/19 248 lb 6.4 oz (112.7 kg)     Lab Results  Component Value Date   WBC 9.0 05/20/2019   HGB 13.5 05/20/2019   HCT 41.8 05/20/2019   PLT 251.0 05/20/2019   GLUCOSE 96 05/20/2019   CHOL 221 (H) 05/20/2019   TRIG 56.0 05/20/2019   HDL 48.50 05/20/2019   LDLCALC 161 (H) 05/20/2019   ALT 9 05/20/2019   AST 13 05/20/2019   NA 139 05/20/2019   K 4.0 05/20/2019   CL 102 05/20/2019   CREATININE 0.72 05/20/2019   BUN 21 05/20/2019   CO2 29 05/20/2019   TSH 1.33 05/16/2016   HGBA1C 5.9 05/20/2019    ASSESSMENT AND PLAN:  Discussed the following assessment and  plan:  Medicare annual wellness visit, subsequent  Severe obesity (BMI >= 40) (HCC)  Arthritis of knee  Essential hypertension  Need for influenza vaccination - Plan: Flu Vaccine QUAD High Dose(Fluad) Yearly lfu vaccine  Continue to work on  weight management obesity to help with other conditions    Expectant management. And  Preventive   Fall  Joint. Immunizations utd.  Patient Care Team: Climmie Buelow, Standley Brooking, MD as PCP - General Marybelle Killings, MD (Orthopedic Surgery)  Patient Instructions  FLU VACCINE TODAY ADVISE [place]  At your pharmacy for avoiding shingles .   Td  Booster at your pharmacy if needed .  Consider  Medication for lowering cholesterol.  We can visit again at your next CPX in February . Continue healthy weight loss  Advise routine dental care.     Bone Health Bones protect organs, store calcium, anchor muscles, and support the whole body. Keeping your bones strong is important, especially as you get older. You can take actions to help keep your bones strong and healthy. Why is keeping my bones healthy important?  Keeping your bones healthy is important because your body constantly replaces bone cells. Cells get old, and new cells take their place. As we age, we lose bone cells because the body may not be able to make enough new cells to replace the old cells. The amount of bone cells and bone tissue you have is referred to as bone mass. The higher your bone mass, the stronger your bones. The aging process leads to an overall loss of bone mass in the body, which can increase the likelihood of:  Joint pain and stiffness.  Broken bones.  A condition in which the bones become weak and brittle (osteoporosis). A large decline in bone mass occurs in older adults. In women, it occurs about the time of menopause. What actions can I take to keep my bones healthy? Good health habits are important for maintaining healthy bones. This includes eating nutritious  foods and exercising regularly. To have healthy bones, you need to get enough of the right minerals and vitamins. Most nutrition experts recommend getting these nutrients from the foods that you eat. In some cases, taking supplements may also be recommended. Doing certain types of exercise is also important for bone health. What are the nutritional recommendations for healthy bones?  Eating a well-balanced diet with plenty of calcium and vitamin D will help to protect your bones. Nutritional recommendations vary from person to person. Ask your health care provider what is healthy for you. Here are some general guidelines. Get enough calcium Calcium is the most important (essential) mineral for bone health. Most people can get enough calcium from their diet, but supplements may be recommended for people who are at risk for osteoporosis. Good sources of calcium include:  Dairy products, such as low-fat or nonfat milk, cheese, and yogurt.  Dark green leafy vegetables, such as bok choy and broccoli.  Calcium-fortified foods, such as orange juice, cereal, bread, soy beverages, and tofu products.  Nuts, such as almonds. Follow these recommended amounts for daily calcium intake:  Children, age 818-3: 700 mg.  Children, age 81-8: 1,000 mg.  Children, age 82-13: 1,300 mg.  Teens, age 64-18: 1,300 mg.  Adults, age 52-50: 1,000 mg.  Adults, age 34-70: ? Men: 1,000 mg. ? Women: 1,200 mg.  Adults, age 61 or older: 1,200 mg.  Pregnant and breastfeeding females: ? Teens: 1,300 mg. ? Adults: 1,000 mg. Get enough vitamin D Vitamin D is the most essential vitamin for bone health. It helps the body absorb calcium. Sunlight stimulates the skin to make vitamin D, so be sure to get enough sunlight. If you live in a cold climate or you do not  get outside often, your health care provider may recommend that you take vitamin D supplements. Good sources of vitamin D in your diet include:  Egg  yolks.  Saltwater fish.  Milk and cereal fortified with vitamin D. Follow these recommended amounts for daily vitamin D intake:  Children and teens, age 36-18: 600 international units.  Adults, age 48 or younger: 400-800 international units.  Adults, age 68 or older: 800-1,000 international units. Get other important nutrients Other nutrients that are important for bone health include:  Phosphorus. This mineral is found in meat, poultry, dairy foods, nuts, and legumes. The recommended daily intake for adult men and adult women is 700 mg.  Magnesium. This mineral is found in seeds, nuts, dark green vegetables, and legumes. The recommended daily intake for adult men is 400-420 mg. For adult women, it is 310-320 mg.  Vitamin K. This vitamin is found in green leafy vegetables. The recommended daily intake is 120 mg for adult men and 90 mg for adult women. What type of physical activity is best for building and maintaining healthy bones? Weight-bearing and strength-building activities are important for building and maintaining healthy bones. Weight-bearing activities cause muscles and bones to work against gravity. Strength-building activities increase the strength of the muscles that support bones. Weight-bearing and muscle-building activities include:  Walking and hiking.  Jogging and running.  Dancing.  Gym exercises.  Lifting weights.  Tennis and racquetball.  Climbing stairs.  Aerobics. Adults should get at least 30 minutes of moderate physical activity on most days. Children should get at least 60 minutes of moderate physical activity on most days. Ask your health care provider what type of exercise is best for you. How can I find out if my bone mass is low? Bone mass can be measured with an X-ray test called a bone mineral density (BMD) test. This test is recommended for all women who are age 66 or older. It may also be recommended for:  Men who are age 43 or  older.  People who are at risk for osteoporosis because of: ? Having bones that break easily. ? Having a long-term disease that weakens bones, such as kidney disease or rheumatoid arthritis. ? Having menopause earlier than normal. ? Taking medicine that weakens bones, such as steroids, thyroid hormones, or hormone treatment for breast cancer or prostate cancer. ? Smoking. ? Drinking three or more alcoholic drinks a day. If you find that you have a low bone mass, you may be able to prevent osteoporosis or further bone loss by changing your diet and lifestyle. Where can I find more information? For more information, check out the following websites:  [place]: AviationTales.fr  [employer] of Health: www.bones.SouthExposed.es  International Osteoporosis Foundation: Administrator.iofbonehealth.org Summary  The aging process leads to an overall loss of bone mass in the body, which can increase the likelihood of broken bones and osteoporosis.  Eating a well-balanced diet with plenty of calcium and vitamin D will help to protect your bones.  Weight-bearing and strength-building activities are also important for building and maintaining strong bones.  Bone mass can be measured with an X-ray test called a bone mineral density (BMD) test. This information is not intended to replace advice given to you by your health care provider. Make sure you discuss any questions you have with your health care provider. Document Revised: 04/24/2017 Document Reviewed: 04/24/2017 Elsevier Patient Education  2020 [place] Tulip Meharg M.D.

## 2020-01-15 ENCOUNTER — Other Ambulatory Visit: Payer: Self-pay

## 2020-01-15 ENCOUNTER — Encounter: Payer: Self-pay | Admitting: Internal Medicine

## 2020-01-15 ENCOUNTER — Ambulatory Visit (INDEPENDENT_AMBULATORY_CARE_PROVIDER_SITE_OTHER): Payer: Medicare Other | Admitting: Internal Medicine

## 2020-01-15 VITALS — BP 120/76 | HR 70 | Temp 98.0°F | Ht 64.0 in | Wt 244.6 lb

## 2020-01-15 DIAGNOSIS — I10 Essential (primary) hypertension: Secondary | ICD-10-CM

## 2020-01-15 DIAGNOSIS — Z0000 Encounter for general adult medical examination without abnormal findings: Secondary | ICD-10-CM | POA: Diagnosis not present

## 2020-01-15 DIAGNOSIS — M1710 Unilateral primary osteoarthritis, unspecified knee: Secondary | ICD-10-CM | POA: Diagnosis not present

## 2020-01-15 DIAGNOSIS — Z23 Encounter for immunization: Secondary | ICD-10-CM

## 2020-01-15 NOTE — Patient Instructions (Addendum)
FLU VACCINE TODAY ADVISE SHINGRIX VACCINE  At your pharmacy for avoiding shingles .   Td  Booster at your pharmacy if needed .  Consider  Medication for lowering cholesterol.  We can visit again at your next CPX in February . Continue healthy weight loss  Advise routine dental care.     Bone Health Bones protect organs, store calcium, anchor muscles, and support the whole body. Keeping your bones strong is important, especially as you get older. You can take actions to help keep your bones strong and healthy. Why is keeping my bones healthy important?  Keeping your bones healthy is important because your body constantly replaces bone cells. Cells get old, and new cells take their place. As we age, we lose bone cells because the body may not be able to make enough new cells to replace the old cells. The amount of bone cells and bone tissue you have is referred to as bone mass. The higher your bone mass, the stronger your bones. The aging process leads to an overall loss of bone mass in the body, which can increase the likelihood of:  Joint pain and stiffness.  Broken bones.  A condition in which the bones become weak and brittle (osteoporosis). A large decline in bone mass occurs in older adults. In women, it occurs about the time of menopause. What actions can I take to keep my bones healthy? Good health habits are important for maintaining healthy bones. This includes eating nutritious foods and exercising regularly. To have healthy bones, you need to get enough of the right minerals and vitamins. Most nutrition experts recommend getting these nutrients from the foods that you eat. In some cases, taking supplements may also be recommended. Doing certain types of exercise is also important for bone health. What are the nutritional recommendations for healthy bones?  Eating a well-balanced diet with plenty of calcium and vitamin D will help to protect your bones. Nutritional  recommendations vary from person to person. Ask your health care provider what is healthy for you. Here are some general guidelines. Get enough calcium Calcium is the most important (essential) mineral for bone health. Most people can get enough calcium from their diet, but supplements may be recommended for people who are at risk for osteoporosis. Good sources of calcium include:  Dairy products, such as low-fat or nonfat milk, cheese, and yogurt.  Dark green leafy vegetables, such as bok choy and broccoli.  Calcium-fortified foods, such as orange juice, cereal, bread, soy beverages, and tofu products.  Nuts, such as almonds. Follow these recommended amounts for daily calcium intake:  Children, age 5-3: 700 mg.  Children, age 39-8: 1,000 mg.  Children, age 5-13: 1,300 mg.  Teens, age 93-18: 1,300 mg.  Adults, age 2-50: 1,000 mg.  Adults, age 62-70: ? Men: 1,000 mg. ? Women: 1,200 mg.  Adults, age 6 or older: 1,200 mg.  Pregnant and breastfeeding females: ? Teens: 1,300 mg. ? Adults: 1,000 mg. Get enough vitamin D Vitamin D is the most essential vitamin for bone health. It helps the body absorb calcium. Sunlight stimulates the skin to make vitamin D, so be sure to get enough sunlight. If you live in a cold climate or you do not get outside often, your health care provider may recommend that you take vitamin D supplements. Good sources of vitamin D in your diet include:  Egg yolks.  Saltwater fish.  Milk and cereal fortified with vitamin D. Follow these recommended amounts for daily vitamin  D intake:  Children and teens, age 8-18: 600 international units.  Adults, age 25 or younger: 400-800 international units.  Adults, age 67 or older: 800-1,000 international units. Get other important nutrients Other nutrients that are important for bone health include:  Phosphorus. This mineral is found in meat, poultry, dairy foods, nuts, and legumes. The recommended daily intake  for adult men and adult women is 700 mg.  Magnesium. This mineral is found in seeds, nuts, dark green vegetables, and legumes. The recommended daily intake for adult men is 400-420 mg. For adult women, it is 310-320 mg.  Vitamin K. This vitamin is found in green leafy vegetables. The recommended daily intake is 120 mg for adult men and 90 mg for adult women. What type of physical activity is best for building and maintaining healthy bones? Weight-bearing and strength-building activities are important for building and maintaining healthy bones. Weight-bearing activities cause muscles and bones to work against gravity. Strength-building activities increase the strength of the muscles that support bones. Weight-bearing and muscle-building activities include:  Walking and hiking.  Jogging and running.  Dancing.  Gym exercises.  Lifting weights.  Tennis and racquetball.  Climbing stairs.  Aerobics. Adults should get at least 30 minutes of moderate physical activity on most days. Children should get at least 60 minutes of moderate physical activity on most days. Ask your health care provider what type of exercise is best for you. How can I find out if my bone mass is low? Bone mass can be measured with an X-ray test called a bone mineral density (BMD) test. This test is recommended for all women who are age 39 or older. It may also be recommended for:  Men who are age 81 or older.  People who are at risk for osteoporosis because of: ? Having bones that break easily. ? Having a long-term disease that weakens bones, such as kidney disease or rheumatoid arthritis. ? Having menopause earlier than normal. ? Taking medicine that weakens bones, such as steroids, thyroid hormones, or hormone treatment for breast cancer or prostate cancer. ? Smoking. ? Drinking three or more alcoholic drinks a day. If you find that you have a low bone mass, you may be able to prevent osteoporosis or further bone  loss by changing your diet and lifestyle. Where can I find more information? For more information, check out the following websites:  [place]: AviationTales.fr  [employer] of Health: www.bones.SouthExposed.es  International Osteoporosis Foundation: Administrator.iofbonehealth.org Summary  The aging process leads to an overall loss of bone mass in the body, which can increase the likelihood of broken bones and osteoporosis.  Eating a well-balanced diet with plenty of calcium and vitamin D will help to protect your bones.  Weight-bearing and strength-building activities are also important for building and maintaining strong bones.  Bone mass can be measured with an X-ray test called a bone mineral density (BMD) test. This information is not intended to replace advice given to you by your health care provider. Make sure you discuss any questions you have with your health care provider. Document Revised: 04/24/2017 Document Reviewed: 04/24/2017 Elsevier Patient Education  2020 [employer].

## 2020-02-03 ENCOUNTER — Other Ambulatory Visit: Payer: Self-pay | Admitting: Internal Medicine

## 2020-02-03 DIAGNOSIS — Z1231 Encounter for screening mammogram for malignant neoplasm of breast: Secondary | ICD-10-CM

## 2020-03-12 ENCOUNTER — Ambulatory Visit: Payer: Medicare Other

## 2020-03-19 ENCOUNTER — Other Ambulatory Visit: Payer: Self-pay

## 2020-03-19 ENCOUNTER — Ambulatory Visit
Admission: RE | Admit: 2020-03-19 | Discharge: 2020-03-19 | Disposition: A | Discharge status: Home / Self Care | Payer: Medicare Other | Source: Ambulatory Visit | Attending: Internal Medicine | Admitting: Internal Medicine

## 2020-03-19 DIAGNOSIS — Z1231 Encounter for screening mammogram for malignant neoplasm of breast: Secondary | ICD-10-CM

## 2020-05-12 ENCOUNTER — Telehealth: Payer: Self-pay | Admitting: Internal Medicine

## 2020-05-12 DIAGNOSIS — Z8616 Personal history of COVID-19: Secondary | ICD-10-CM

## 2020-05-12 DIAGNOSIS — R059 Cough, unspecified: Secondary | ICD-10-CM | POA: Diagnosis not present

## 2020-05-12 HISTORY — DX: Personal history of COVID-19: Z86.16

## 2020-05-12 NOTE — Telephone Encounter (Signed)
Pt call and stated she need something call in for her sinus to [facility] at [address] ,[place] .

## 2020-05-12 NOTE — Telephone Encounter (Signed)
Spoke with patient, she said that she is feeling a little better.  Had a COVID-19 test performed today.  Will wait for results before scheduling an appointment to be seen.

## 2020-05-12 NOTE — Telephone Encounter (Signed)
Not enough information  Get more information  Can make visit virtual if feels needs prescription medication or advice .

## 2020-05-14 ENCOUNTER — Encounter: Payer: Self-pay | Admitting: Adult Health

## 2020-05-14 ENCOUNTER — Other Ambulatory Visit: Payer: Self-pay

## 2020-05-14 ENCOUNTER — Telehealth (INDEPENDENT_AMBULATORY_CARE_PROVIDER_SITE_OTHER): Payer: Medicare Other | Admitting: Adult Health

## 2020-05-14 VITALS — Wt 244.0 lb

## 2020-05-14 DIAGNOSIS — B9789 Other viral agents as the cause of diseases classified elsewhere: Secondary | ICD-10-CM

## 2020-05-14 DIAGNOSIS — J028 Acute pharyngitis due to other specified organisms: Secondary | ICD-10-CM

## 2020-05-14 DIAGNOSIS — U071 COVID-19: Secondary | ICD-10-CM

## 2020-05-14 MED ORDER — MAGIC MOUTHWASH W/LIDOCAINE
5.0000 mL | Freq: Three times a day (TID) | ORAL | 0 refills | Status: DC | PRN
Start: 2020-05-14 — End: 2020-10-14

## 2020-05-14 NOTE — Progress Notes (Signed)
Virtual Visit via Telephone Note  I connected with Nina Le on 05/14/20 at 11:00 AM EST by telephone and verified that I am speaking with the correct person using two identifiers.   I discussed the limitations, risks, security and privacy concerns of performing an evaluation and management service by telephone and the availability of in person appointments. I also discussed with the patient that there may be a patient responsible charge related to this service. The patient expressed understanding and agreed to proceed.  Location patient: home Location provider: work or home office Participants present for the call: patient, provider Patient did not have a visit in the prior 7 days to address this/these issue(s).   History of Present Illness: 80 year old female who  has a past medical history of Acute back pain (03/12/2011), Alkaline phosphatase elevation, Bacterial endocarditis (3235), Diastolic dysfunction, Hyperlipidemia, Hypertension, and Low back pain.  He is being evaluated today for an acute issue.  Her symptoms started 2 days ago with diarrhea and sore throat.  She had a Covid test done that evening and her results came back positive earlier this morning.  She reports that her diarrhea has resolved but she continues to have a "severely sore throat".  She denies chills, fever, wheezing, shortness of breath, or headache.  She has been fully vaccinated against COVID-19.  At home she has been using Alka-Seltzer with minimal relief of her sore throat.   Observations/Objective: Patient sounds cheerful and well on the phone. I do not appreciate any SOB. Speech and thought processing are grossly intact. Patient reported vitals:  Assessment and Plan: 1. COVID-19 virus infection -Thankfully her symptoms are mild.  Advise follow-up if symptoms worsen over the week  2. Sore throat (viral) -Advised warm drinks with honey, can continue with Alka-Seltzer.  Can also take Tylenol as  needed.  Sore throat resolved in the next couple of days - magic mouthwash w/lidocaine SOLN; Take 5 mLs by mouth 3 (three) times daily as needed for mouth pain. Gargle and spit  Dispense: 180 mL; Refill: 0   Follow Up Instructions:  I did not refer this patient for an OV in the next 24 hours for this/these issue(s).  I discussed the assessment and treatment plan with the patient. The patient was provided an opportunity to ask questions and all were answered. The patient agreed with the plan and demonstrated an understanding of the instructions.   The patient was advised to call back or seek an in-person evaluation if the symptoms worsen or if the condition fails to improve as anticipated.  I provided 13  minutes of non-face-to-face time during this encounter.   Nina Peng, NP

## 2020-05-19 ENCOUNTER — Telehealth: Payer: Self-pay | Admitting: Internal Medicine

## 2020-05-19 NOTE — Telephone Encounter (Signed)
Pt is calling in wanting to see if she can get something for her head congestion.  Pharm:  [facility], [place]  Pt declined an appointment.  Pt would like to have a call back to let her know if something will be called in for her.

## 2020-05-19 NOTE — Telephone Encounter (Signed)
Last office visit 05/20/2019 with Dr. Regis Bill.  Last video visit- 05/14/2020- with Tommi Rumps

## 2020-05-19 NOTE — Telephone Encounter (Signed)
Referring this question to [place] who saw her on the third for this problem

## 2020-05-20 ENCOUNTER — Encounter: Payer: Medicare Other | Admitting: Internal Medicine

## 2020-05-20 NOTE — Telephone Encounter (Signed)
Left a message for a return call.

## 2020-05-20 NOTE — Telephone Encounter (Signed)
Since she not having those symptoms when we talked, I would be happy to reevaluate her and see if there is anything we can send in to help with her symptoms

## 2020-05-21 NOTE — Telephone Encounter (Signed)
Left a message for a return call.  Left a detailed message informing the pt to call back and schedule an appointment to be evaluated further by [facility] if she is still having symptoms.  Nothing further needed.

## 2020-06-09 NOTE — Progress Notes (Signed)
Chief Complaint  Patient presents with  . Annual Exam    HPI: Nina Le 80 y.o. comes in today for Preventive Medicare exam/ wellness visit .Since last visit. Had resp infection   covid   Much better . BP take med needs refill    Nose reported  OA knees mostly take tylenol  1-2 x per day as tolerated . No new sx  Working on weight loss slow decrease.   Health Maintenance  Topic Date Due  . Hepatitis C Screening  Never done  . TETANUS/TDAP  04/11/2014  . INFLUENZA VACCINE  Completed  . DEXA SCAN  Completed  . COVID-19 Vaccine  Completed  . PNA vac Low Risk Adult  Completed  . HPV VACCINES  Aged Out   Health Maintenance Review LIFESTYLE:  Exercise:  Adls  HW outside  Tobacco/ETS:n Alcohol: n Sugar beverages: ginger ale ocass pepsi  Sleep:6-7 hours  Drug use: no HH:1    Hearing: ok  Vision:  No limitations at present . Last eye check UTD  Safety:  Has smoke detector and wears seat belts.   No excess sun exposure. Sees dentist regularly.   Memory: Felt to be good  , no concern from her or her family.  Depression: No anhedonia unusual crying or depressive symptoms  Nutrition: Eats well balanced diet; adequate calcium and vitamin D. No swallowing chewing problems.  Injury: no major injuries in the last six months.  Other healthcare providers:  Reviewed today .  Preventive parameters:   Reviewed  tdap and shingrix   ADLS:   There are no problems or need for assistance  driving, feeding, obtaining food, dressing, toileting and bathing, managing money using phone. She is independent.    ROS:  GEN/ HEENT: No fever, significant weight changes sweats headaches vision problems hearing changes, CV/ PULM; No chest pain shortness of breath cough, syncope,edema  change in exercise tolerance. GI /GU: No adominal pain, vomiting, change in bowel habits. No blood in the stool. No significant GU symptoms. SKIN/HEME: ,no acute skin rashes suspicious lesions or  bleeding. No lymphadenopathy, nodules, masses.  NEURO/ PSYCH:  No neurologic signs such as weakness numbness. No depression anxiety. IMM/ Allergy: No unusual infections.  Allergy .   REST of 12 system review negative except as per HPI   Past Medical History:  Diagnosis Date  . Acute back pain 03/12/2011  . Alkaline phosphatase elevation    hx-neg lfts  . Bacterial endocarditis 2006  . Diastolic dysfunction    Mild Echo  . Hyperlipidemia   . Hypertension   . Low back pain     Family History  Problem Relation Age of Onset  . Arthritis Mother   . Hypertension Mother   . Breast cancer Mother        cancer in the blood  . Heart disease Father   . Breast cancer Sister   . Colon cancer Neg Hx     Social History   Socioeconomic History  . Marital status: Married    Spouse name: Not on file  . Number of children: Not on file  . Years of education: Not on file  . Highest education level: Not on file  Occupational History  . Not on file  Tobacco Use  . Smoking status: Never Smoker  . Smokeless tobacco: Never Used  Vaping Use  . Vaping Use: Never used  Substance and Sexual Activity  . Alcohol use: No  . Drug use: No  . Sexual  activity: Not on file  Other Topics Concern  . 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 aspirin EC 81 MG tablet Take 81 mg by mouth daily. Swallow whole.  . Cholecalciferol (VITAMIN D-3 PO) Take 2,000 Units by mouth daily.  . magic mouthwash w/lidocaine SOLN Take 5 mLs by mouth 3 (three) times  daily as needed for mouth pain. Gargle and spit  . Multiple Vitamin (MULTIVITAMIN) tablet Take 1 tablet by mouth daily.  . [DISCONTINUED] amLODipine (NORVASC) 10 MG tablet Take 1 tablet (10 mg total) by mouth daily.  . [DISCONTINUED] lisinopril-hydrochlorothiazide (ZESTORETIC) 20-12.5 MG tablet Take 1 tablet by mouth daily.  Marland Kitchen amLODipine (NORVASC) 10 MG tablet Take 1 tablet (10 mg total) by mouth daily.  Marland Kitchen lisinopril-hydrochlorothiazide (ZESTORETIC) 20-12.5 MG tablet Take 1 tablet by mouth daily.  . [DISCONTINUED] diclofenac sodium (VOLTAREN) 1 % GEL Apply 4 g topically 4 (four) times daily. As needed for knee pain (Patient not taking: No sig reported)  . [DISCONTINUED] ibuprofen (ADVIL) 800 MG tablet Take 1 tablet (800 mg total) by mouth 2 (two) times daily as needed. For hip or knee pain (Patient not taking: Reported on 05/14/2020)   No facility-administered encounter medications on file as of 06/10/2020.    EXAM:  BP 120/72   Pulse 75   Temp 98.2 F (36.8 C)   Resp 16   Ht 5' 4"  (1.626 m)   Wt 242 lb 3.2 oz (109.9 kg)   SpO2 94%   BMI 41.57 kg/m   Body mass index is 41.57 kg/m.  Physical Exam: Vital signs reviewed YOV:ZCHY is a well-developed well-nourished alert cooperative   who appears stated age in no acute distress.  HEENT: normocephalic atraumatic , Eyes: PERRL EOM's full, conjunctiva clear, Nares: paten,t no deformity discharge or tenderness., Ears: no deformity EAC's clear TMs with normal landmarks. Mouth: masked NECK: supple without masses, thyromegaly or bruits. CHEST/PULM:  Clear to auscultation and percussion breath sounds equal no wheeze , rales or rhonchi. No chest wall deformities or tenderness. CV: PMI is nondisplaced, S1 S2 no gallops, murmurs, rubs. Peripheral pulses are full without delay.No JVD .  Breast: normal by inspection . No dimpling, discharge, masses, tenderness or discharge . ABDOMEN: Bowel sounds normal nontender  No guard or rebound, no hepato  splenomegal no CVA tenderness.   Extremtities:  No clubbing cyanosis or edema, no acute joint swelling or redness no focal atrophy  djd knees no acute swelling gait  slow but independent  NEURO:  Oriented x3, cranial nerves 3-12 appear to be intact, no obvious focal weakness,gait within normal limits no abnormal reflexes  SKIN: No acute rashes normal turgor, color, no bruising or petechiae. PSYCH: Oriented, good eye contact, no obvious depression anxiety, cognition and judgment appear normal. LN: no cervical axillaryadenopathy No noted deficits in memory, attention, and speech.   Lab Results  Component Value Date   WBC 9.0 05/20/2019   HGB 13.5  05/20/2019   HCT 41.8 05/20/2019   PLT 251.0 05/20/2019   GLUCOSE 96 05/20/2019   CHOL 221 (H) 05/20/2019   TRIG 56.0 05/20/2019   HDL 48.50 05/20/2019   LDLCALC 161 (H) 05/20/2019   ALT 9 05/20/2019   AST 13 05/20/2019   NA 139 05/20/2019   K 4.0 05/20/2019   CL 102 05/20/2019   CREATININE 0.72 05/20/2019   BUN 21 05/20/2019   CO2 29 05/20/2019   TSH 1.33 05/16/2016   HGBA1C 5.9 05/20/2019    ASSESSMENT AND PLAN: Wt Readings from Last 3 Encounters:  06/10/20 242 lb 3.2 oz (109.9 kg)  05/14/20 244 lb (110.7 kg)  01/15/20 244 lb 9.6 oz (110.9 kg)    Discussed the following assessment and plan:  Visit for preventive health examination  Medication management - Plan: Basic metabolic panel, CBC with Differential/Platelet, Hepatic function panel, Lipid panel, Hemoglobin A1c, Hemoglobin A1c, Lipid panel, Basic metabolic panel, Hepatic function panel, CBC with Differential/Platelet  Essential hypertension - Plan: Basic metabolic panel, CBC with Differential/Platelet, Hepatic function panel, Lipid panel, Hemoglobin A1c, Hemoglobin A1c, Lipid panel, Basic metabolic panel, Hepatic function panel, CBC with Differential/Platelet  Severe obesity (BMI >= 40) (HCC) - slow improvment  - Plan: Basic metabolic panel, CBC with Differential/Platelet,  Hepatic function panel, Lipid panel, Hemoglobin A1c, Hemoglobin A1c, Lipid panel, Basic metabolic panel, Hepatic function panel, CBC with Differential/Platelet  Hyperlipidemia, unspecified hyperlipidemia type - Plan: Basic metabolic panel, CBC with Differential/Platelet, Hepatic function panel, Lipid panel, Hemoglobin A1c, Hemoglobin A1c, Lipid panel, Basic metabolic panel, Hepatic function panel, CBC with Differential/Platelet  Hyperglycemia - Plan: Basic metabolic panel, CBC with Differential/Platelet, Hepatic function panel, Lipid panel, Hemoglobin A1c, Hemoglobin A1c, Lipid panel, Basic metabolic panel, Hepatic function panel, CBC with Differential/Platelet  Need for Tdap vaccination - Plan: Tdap vaccine greater than or equal to 7yo IM  Need for hepatitis C screening test - Plan: Hepatitis C antibody, Hepatitis C antibody  History of COVID-19 - recnetly recovered   Arthritis of knee Lab monitoring today   bp at goal  Refill med   .  Patient Care Team: Renesha Lizama, Standley Brooking, MD as PCP - General Marybelle Killings, MD (Orthopedic Surgery)  Patient Instructions   Will notify you  of labs when available.   [facility] send in  Refill med to [facility] flu vaccine   Your BP is at goal   Can get shingles vaccine at your pharmacy  shingrix   Preventive Care 65 Years and Older, Female Preventive care refers to lifestyle choices and visits with your health care provider that can promote health and wellness. This includes:  A yearly physical exam. This is also called an annual wellness visit.  Regular dental and eye exams.  Immunizations.  Screening for certain conditions.  Healthy lifestyle choices, such as: ? Eating a healthy diet. ? Getting regular exercise. ? Not using drugs or products that contain nicotine and tobacco. ? Limiting alcohol use. What can I expect for my preventive care visit? Physical exam Your health care provider will check your:  Height and weight. These may be  used to calculate your BMI (body mass index). BMI is a measurement that tells if you are at a healthy weight.  Heart rate and blood pressure.  Body temperature.  Skin for abnormal spots. Counseling Your health care provider may ask you questions about your:  Past medical problems.  Family's medical history.  Alcohol, tobacco, and drug use.  Emotional well-being.  Home  life and relationship well-being.  Sexual activity.  Diet, exercise, and sleep habits.  History of falls.  Memory and ability to understand (cognition).  Work and work Statistician.  Pregnancy and menstrual history.  Access to firearms. What immunizations do I need? Vaccines are usually given at various ages, according to a schedule. Your health care provider will recommend vaccines for you based on your age, medical history, and lifestyle or other factors, such as travel or where you work.   What tests do I need? Blood tests  Lipid and cholesterol levels. These may be checked every 5 years, or more often depending on your overall health.  Hepatitis C test.  Hepatitis B test. Screening  Lung cancer screening. You may have this screening every year starting at age 68 if you have a 30-pack-year history of smoking and currently smoke or have quit within the past 15 years.  Colorectal cancer screening. ? All adults should have this screening starting at age 10 and continuing until age 11. ? Your health care provider may recommend screening at age 56 if you are at increased risk. ? You will have tests every 1-10 years, depending on your results and the type of screening test.  Diabetes screening. ? This is done by checking your blood sugar (glucose) after you have not eaten for a while (fasting). ? You may have this done every 1-3 years.  Mammogram. ? This may be done every 1-2 years. ? Talk with your health care provider about how often you should have regular mammograms.  Abdominal aortic aneurysm  (AAA) screening. You may need this if you are a current or former smoker.  BRCA-related cancer screening. This may be done if you have a family history of breast, ovarian, tubal, or peritoneal cancers. Other tests  STD (sexually transmitted disease) testing, if you are at risk.  Bone density scan. This is done to screen for osteoporosis. You may have this done starting at age 29. Talk with your health care provider about your test results, treatment options, and if necessary, the need for more tests. Follow these instructions at home: Eating and drinking  Eat a diet that includes fresh fruits and vegetables, whole grains, lean protein, and low-fat dairy products. Limit your intake of foods with high amounts of sugar, saturated fats, and salt.  Take vitamin and mineral supplements as recommended by your health care provider.  Do not drink alcohol if your health care provider tells you not to drink.  If you drink alcohol: ? Limit how much you have to 0-1 drink a day. ? Be aware of how much alcohol is in your drink. In the [place] one drink equals one 12 oz bottle of beer (355 mL), one 5 oz glass of wine (148 mL), or one 1 oz glass of hard liquor (44 mL).   Lifestyle  Take daily care of your teeth and gums. Brush your teeth every morning and night with fluoride toothpaste. Floss one time each day.  Stay active. Exercise for at least 30 minutes 5 or more days each week.  Do not use any products that contain nicotine or tobacco, such as cigarettes, e-cigarettes, and chewing tobacco. If you need help quitting, ask your health care provider.  Do not use drugs.  If you are sexually active, practice safe sex. Use a condom or other form of protection in order to prevent STIs (sexually transmitted infections).  Talk with your health care provider about taking a low-dose aspirin or statin.  Find  healthy ways to cope with stress, such as: ? Meditation, yoga, or listening to  music. ? Journaling. ? Talking to a trusted person. ? Spending time with friends and family. Safety  Always wear your seat belt while driving or riding in a vehicle.  Do not drive: ? If you have been drinking alcohol. Do not ride with someone who has been drinking. ? When you are tired or distracted. ? While texting.  Wear a helmet and other protective equipment during sports activities.  If you have firearms in your house, make sure you follow all gun safety procedures. What's next?  Visit your health care provider once a year for an annual wellness visit.  Ask your health care provider how often you should have your eyes and teeth checked.  Stay up to date on all vaccines. This information is not intended to replace advice given to you by your health care provider. Make sure you discuss any questions you have with your health care provider. Document Revised: 03/18/2020 Document Reviewed: 03/22/2018 Elsevier Patient Education  2021 [place] K. Demetris Meinhardt M.D.

## 2020-06-10 ENCOUNTER — Ambulatory Visit (INDEPENDENT_AMBULATORY_CARE_PROVIDER_SITE_OTHER): Payer: Medicare Other | Admitting: Internal Medicine

## 2020-06-10 ENCOUNTER — Other Ambulatory Visit: Payer: Self-pay

## 2020-06-10 ENCOUNTER — Encounter: Payer: Self-pay | Admitting: Internal Medicine

## 2020-06-10 VITALS — BP 120/72 | HR 75 | Temp 98.2°F | Resp 16 | Ht 64.0 in | Wt 242.2 lb

## 2020-06-10 DIAGNOSIS — Z8616 Personal history of COVID-19: Secondary | ICD-10-CM

## 2020-06-10 DIAGNOSIS — Z79899 Other long term (current) drug therapy: Secondary | ICD-10-CM

## 2020-06-10 DIAGNOSIS — M1710 Unilateral primary osteoarthritis, unspecified knee: Secondary | ICD-10-CM

## 2020-06-10 DIAGNOSIS — I10 Essential (primary) hypertension: Secondary | ICD-10-CM | POA: Diagnosis not present

## 2020-06-10 DIAGNOSIS — Z1159 Encounter for screening for other viral diseases: Secondary | ICD-10-CM | POA: Diagnosis not present

## 2020-06-10 DIAGNOSIS — E785 Hyperlipidemia, unspecified: Secondary | ICD-10-CM

## 2020-06-10 DIAGNOSIS — R739 Hyperglycemia, unspecified: Secondary | ICD-10-CM

## 2020-06-10 DIAGNOSIS — Z0000 Encounter for general adult medical examination without abnormal findings: Secondary | ICD-10-CM | POA: Diagnosis not present

## 2020-06-10 LAB — HEPATIC FUNCTION PANEL
ALT: 14 U/L (ref 0–35)
AST: 17 U/L (ref 0–37)
Albumin: 4 g/dL (ref 3.5–5.2)
Alkaline Phosphatase: 103 U/L (ref 39–117)
Bilirubin, Direct: 0.1 mg/dL (ref 0.0–0.3)
Total Bilirubin: 0.4 mg/dL (ref 0.2–1.2)
Total Protein: 7.6 g/dL (ref 6.0–8.3)

## 2020-06-10 LAB — CBC WITH DIFFERENTIAL/PLATELET
Basophils Absolute: 0 10*3/uL (ref 0.0–0.1)
Basophils Relative: 0.5 % (ref 0.0–3.0)
Eosinophils Absolute: 0 10*3/uL (ref 0.0–0.7)
Eosinophils Relative: 0.4 % (ref 0.0–5.0)
HCT: 40 % (ref 36.0–46.0)
Hemoglobin: 13.4 g/dL (ref 12.0–15.0)
Lymphocytes Relative: 26.1 % (ref 12.0–46.0)
Lymphs Abs: 2 10*3/uL (ref 0.7–4.0)
MCHC: 33.4 g/dL (ref 30.0–36.0)
MCV: 84.3 fl (ref 78.0–100.0)
Monocytes Absolute: 0.6 10*3/uL (ref 0.1–1.0)
Monocytes Relative: 7.9 % (ref 3.0–12.0)
Neutro Abs: 4.9 10*3/uL (ref 1.4–7.7)
Neutrophils Relative %: 65.1 % (ref 43.0–77.0)
Platelets: 283 10*3/uL (ref 150.0–400.0)
RBC: 4.74 Mil/uL (ref 3.87–5.11)
RDW: 13.8 % (ref 11.5–15.5)
WBC: 7.6 10*3/uL (ref 4.0–10.5)

## 2020-06-10 LAB — LIPID PANEL
Cholesterol: 209 mg/dL — ABNORMAL HIGH (ref 0–200)
HDL: 53.5 mg/dL (ref >39.00)
LDL Cholesterol: 141 mg/dL — ABNORMAL HIGH (ref 0–99)
NonHDL: 155.34
Total CHOL/HDL Ratio: 4
Triglycerides: 72 mg/dL (ref 0.0–149.0)
VLDL: 14.4 mg/dL (ref 0.0–40.0)

## 2020-06-10 LAB — BASIC METABOLIC PANEL
BUN: 20 mg/dL (ref 6–23)
CO2: 30 mEq/L (ref 19–32)
Calcium: 10.3 mg/dL (ref 8.4–10.5)
Chloride: 101 mEq/L (ref 96–112)
Creatinine, Ser: 0.71 mg/dL (ref 0.40–1.20)
GFR: 81.78 mL/min (ref >60.00)
Glucose, Bld: 89 mg/dL (ref 70–99)
Potassium: 4.4 mEq/L (ref 3.5–5.1)
Sodium: 138 mEq/L (ref 135–145)

## 2020-06-10 LAB — HEMOGLOBIN A1C: Hgb A1c MFr Bld: 5.8 % (ref 4.6–6.5)

## 2020-06-10 MED ORDER — AMLODIPINE BESYLATE 10 MG PO TABS: 10.0000 mg | ORAL_TABLET | Freq: Every day | ORAL | 3 refills | Status: DC

## 2020-06-10 MED ORDER — LISINOPRIL-HYDROCHLOROTHIAZIDE 20-12.5 MG PO TABS: 1.0000 | ORAL_TABLET | Freq: Every day | ORAL | 3 refills | Status: DC

## 2020-06-10 NOTE — Patient Instructions (Addendum)
Will notify you  of labs when available.   [facility] send in  Refill med to [facility] flu vaccine   Your BP is at goal   Can get shingles vaccine at your pharmacy  shingrix   Preventive Care 78 Years and Older, Female Preventive care refers to lifestyle choices and visits with your health care provider that can promote health and wellness. This includes:  A yearly physical exam. This is also called an annual wellness visit.  Regular dental and eye exams.  Immunizations.  Screening for certain conditions.  Healthy lifestyle choices, such as: ? Eating a healthy diet. ? Getting regular exercise. ? Not using drugs or products that contain nicotine and tobacco. ? Limiting alcohol use. What can I expect for my preventive care visit? Physical exam Your health care provider will check your:  Height and weight. These may be used to calculate your BMI (body mass index). BMI is a measurement that tells if you are at a healthy weight.  Heart rate and blood pressure.  Body temperature.  Skin for abnormal spots. Counseling Your health care provider may ask you questions about your:  Past medical problems.  Family's medical history.  Alcohol, tobacco, and drug use.  Emotional well-being.  Home life and relationship well-being.  Sexual activity.  Diet, exercise, and sleep habits.  History of falls.  Memory and ability to understand (cognition).  Work and work Statistician.  Pregnancy and menstrual history.  Access to firearms. What immunizations do I need? Vaccines are usually given at various ages, according to a schedule. Your health care provider will recommend vaccines for you based on your age, medical history, and lifestyle or other factors, such as travel or where you work.   What tests do I need? Blood tests  Lipid and cholesterol levels. These may be checked every 5 years, or more often depending on your overall health.  Hepatitis C test.  Hepatitis B  test. Screening  Lung cancer screening. You may have this screening every year starting at age 78 if you have a 30-pack-year history of smoking and currently smoke or have quit within the past 15 years.  Colorectal cancer screening. ? All adults should have this screening starting at age 78 and continuing until age 45. ? Your health care provider may recommend screening at age 78 if you are at increased risk. ? You will have tests every 1-10 years, depending on your results and the type of screening test.  Diabetes screening. ? This is done by checking your blood sugar (glucose) after you have not eaten for a while (fasting). ? You may have this done every 1-3 years.  Mammogram. ? This may be done every 1-2 years. ? Talk with your health care provider about how often you should have regular mammograms.  Abdominal aortic aneurysm (AAA) screening. You may need this if you are a current or former smoker.  BRCA-related cancer screening. This may be done if you have a family history of breast, ovarian, tubal, or peritoneal cancers. Other tests  STD (sexually transmitted disease) testing, if you are at risk.  Bone density scan. This is done to screen for osteoporosis. You may have this done starting at age 78. Talk with your health care provider about your test results, treatment options, and if necessary, the need for more tests. Follow these instructions at home: Eating and drinking  Eat a diet that includes fresh fruits and vegetables, whole grains, lean protein, and low-fat dairy products.  Limit your intake of foods with high amounts of sugar, saturated fats, and salt.  Take vitamin and mineral supplements as recommended by your health care provider.  Do not drink alcohol if your health care provider tells you not to drink.  If you drink alcohol: ? Limit how much you have to 0-1 drink a day. ? Be aware of how much alcohol is in your drink. In the [place] one drink equals one 12 oz  bottle of beer (355 mL), one 5 oz glass of wine (148 mL), or one 1 oz glass of hard liquor (44 mL).   Lifestyle  Take daily care of your teeth and gums. Brush your teeth every morning and night with fluoride toothpaste. Floss one time each day.  Stay active. Exercise for at least 30 minutes 5 or more days each week.  Do not use any products that contain nicotine or tobacco, such as cigarettes, e-cigarettes, and chewing tobacco. If you need help quitting, ask your health care provider.  Do not use drugs.  If you are sexually active, practice safe sex. Use a condom or other form of protection in order to prevent STIs (sexually transmitted infections).  Talk with your health care provider about taking a low-dose aspirin or statin.  Find healthy ways to cope with stress, such as: ? Meditation, yoga, or listening to music. ? Journaling. ? Talking to a trusted person. ? Spending time with friends and family. Safety  Always wear your seat belt while driving or riding in a vehicle.  Do not drive: ? If you have been drinking alcohol. Do not ride with someone who has been drinking. ? When you are tired or distracted. ? While texting.  Wear a helmet and other protective equipment during sports activities.  If you have firearms in your house, make sure you follow all gun safety procedures. What's next?  Visit your health care provider once a year for an annual wellness visit.  Ask your health care provider how often you should have your eyes and teeth checked.  Stay up to date on all vaccines. This information is not intended to replace advice given to you by your health care provider. Make sure you discuss any questions you have with your health care provider. Document Revised: 03/18/2020 Document Reviewed: 03/22/2018 Elsevier Patient Education  2021 [employer].

## 2020-06-11 LAB — HEPATITIS C ANTIBODY
Hepatitis C Ab: NONREACTIVE
SIGNAL TO CUT-OFF: 0.01 (ref <1.00)

## 2020-06-11 NOTE — Progress Notes (Signed)
Blood results are all normal except cholesterol slightly elevated but improved from last year.  Continue addressing the lifestyle and healthy weight loss and cholesterol may decrease even further.  If she wishes can mail her a copy of the results.  And tell her the numbers.  Since she is not on MyChart

## 2020-07-30 ENCOUNTER — Ambulatory Visit: Payer: Medicare Other | Admitting: Family Medicine

## 2020-07-30 ENCOUNTER — Other Ambulatory Visit: Payer: Self-pay

## 2020-07-30 ENCOUNTER — Emergency Department (HOSPITAL_COMMUNITY): Payer: Medicare Other

## 2020-07-30 ENCOUNTER — Emergency Department (HOSPITAL_COMMUNITY)
Admission: EM | Admit: 2020-07-30 | Discharge: 2020-07-30 | Disposition: A | Discharge status: Home / Self Care | Payer: Medicare Other | Attending: Emergency Medicine | Admitting: Emergency Medicine

## 2020-07-30 DIAGNOSIS — R1030 Lower abdominal pain, unspecified: Secondary | ICD-10-CM | POA: Diagnosis not present

## 2020-07-30 DIAGNOSIS — K921 Melena: Secondary | ICD-10-CM | POA: Diagnosis not present

## 2020-07-30 DIAGNOSIS — R109 Unspecified abdominal pain: Secondary | ICD-10-CM | POA: Diagnosis not present

## 2020-07-30 DIAGNOSIS — I10 Essential (primary) hypertension: Secondary | ICD-10-CM | POA: Diagnosis not present

## 2020-07-30 DIAGNOSIS — Z7982 Long term (current) use of aspirin: Secondary | ICD-10-CM | POA: Diagnosis not present

## 2020-07-30 DIAGNOSIS — R102 Pelvic and perineal pain: Secondary | ICD-10-CM | POA: Diagnosis not present

## 2020-07-30 DIAGNOSIS — Z79899 Other long term (current) drug therapy: Secondary | ICD-10-CM | POA: Diagnosis not present

## 2020-07-30 LAB — URINALYSIS, ROUTINE W REFLEX MICROSCOPIC
Bilirubin Urine: NEGATIVE
Glucose, UA: NEGATIVE mg/dL
Hgb urine dipstick: NEGATIVE
Ketones, ur: 5 mg/dL — AB
Leukocytes,Ua: NEGATIVE
Nitrite: NEGATIVE
Protein, ur: NEGATIVE mg/dL
Specific Gravity, Urine: 1.028 (ref 1.005–1.030)
pH: 5 (ref 5.0–8.0)

## 2020-07-30 LAB — LIPASE, BLOOD: Lipase: 27 U/L (ref 11–51)

## 2020-07-30 LAB — COMPREHENSIVE METABOLIC PANEL
ALT: 15 U/L (ref 0–44)
AST: 26 U/L (ref 15–41)
Albumin: 3.5 g/dL (ref 3.5–5.0)
Alkaline Phosphatase: 88 U/L (ref 38–126)
Anion gap: 6 (ref 5–15)
BUN: 12 mg/dL (ref 8–23)
CO2: 28 mmol/L (ref 22–32)
Calcium: 9.6 mg/dL (ref 8.9–10.3)
Chloride: 105 mmol/L (ref 98–111)
Creatinine, Ser: 0.7 mg/dL (ref 0.44–1.00)
GFR, Estimated: >60 mL/min (ref >60)
Glucose, Bld: 99 mg/dL (ref 70–99)
Potassium: 3.9 mmol/L (ref 3.5–5.1)
Sodium: 139 mmol/L (ref 135–145)
Total Bilirubin: 0.9 mg/dL (ref 0.3–1.2)
Total Protein: 7.3 g/dL (ref 6.5–8.1)

## 2020-07-30 LAB — CBC
HCT: 41.8 % (ref 36.0–46.0)
Hemoglobin: 13.4 g/dL (ref 12.0–15.0)
MCH: 27.9 pg (ref 26.0–34.0)
MCHC: 32.1 g/dL (ref 30.0–36.0)
MCV: 87.1 fL (ref 80.0–100.0)
Platelets: 247 10*3/uL (ref 150–400)
RBC: 4.8 MIL/uL (ref 3.87–5.11)
RDW: 14.3 % (ref 11.5–15.5)
WBC: 8 10*3/uL (ref 4.0–10.5)
nRBC: 0 % (ref 0.0–0.2)

## 2020-07-30 LAB — POC OCCULT BLOOD, ED: Fecal Occult Bld: NEGATIVE

## 2020-07-30 MED ORDER — IOHEXOL 300 MG/ML  SOLN
100.0000 mL | Freq: Once | INTRAMUSCULAR | Status: AC | PRN
  Administered 2020-07-30: 100 mL via INTRAVENOUS

## 2020-07-30 NOTE — ED Provider Notes (Signed)
[facility] EMERGENCY DEPARTMENT Provider Note   CSN: [id number] Arrival date & time: 07/30/20  1057     History Chief Complaint  Patient presents with  . Abdominal Pain  . Melena    Nina Le is a 80 y.o. female.  Patient presents emergency department for evaluation of pelvic pain and dark stool.  Patient states that she began having lower abdominal and pelvic pain about 5 days ago.  She denies associated fevers, chest pain, shortness of breath.  No nausea, vomiting, or diarrhea.  She denies constipation or vaginal bleeding.  She notes a stronger odor than usual to her urine but denies dysuria, hematuria, increased frequency or urgency.  She has been taking Pepto-Bismol for her symptoms.  After starting the Pepto-Bismol, she noted black stools.  Prior to this she did not notice any black or bloody stools.  She does take aspirin daily.  No other heavy NSAID use.  No alcohol use.  No history of abdominal surgeries.        Past Medical History:  Diagnosis Date  . Acute back pain 03/12/2011  . Alkaline phosphatase elevation    hx-neg lfts  . Bacterial endocarditis 2006  . Diastolic dysfunction    Mild Echo  . Hyperlipidemia   . Hypertension   . Low back pain     Patient Active Problem List   Diagnosis Date Noted  . Essential hypertension 03/27/2014  . Arthritis of knee 12/26/2013  . Unspecified venous (peripheral) insufficiency 12/26/2013  . Hyperglycemia 12/03/2013  . Severe obesity (BMI >= 40) ([place]) 01/17/2013  . Medication side effect 01/09/2013  . Hx of abnormal mammogram 10/25/2011  . Knee pain, right 10/25/2011  . Diastolic dysfunction   . TIREDNESS 03/08/2010  . COUGH 02/23/2010  . HYPERLIPIDEMIA 02/28/2008  . OBESITY 02/28/2008  . MITRAL REGURGITATION, MILD 02/28/2008  . HYPERTENSION 11/16/2006    Past Surgical History:  Procedure Laterality Date  . CARDIAC CATHETERIZATION     nl coronary 2006   . COLONOSCOPY    . KNEE  SURGERY     Rt  . SPINE SURGERY  2006   Dr. Lorin Mercy  . US ECHOCARDIOGRAPHY     mild mitral regurgitation     OB History   No obstetric history on file.     Family History  Problem Relation Age of Onset  . Arthritis Mother   . Hypertension Mother   . Breast cancer Mother        cancer in the blood  . Heart disease Father   . Breast cancer Sister   . Colon cancer Neg Hx     Social History   Tobacco Use  . Smoking status: Never Smoker  . Smokeless tobacco: Never Used  Vaping Use  . Vaping Use: Never used  Substance Use Topics  . Alcohol use: No  . Drug use: No    Home Medications Prior to Admission medications   Medication Sig Start Date End Date Taking? Authorizing Provider  amLODipine (NORVASC) 10 MG tablet Take 1 tablet (10 mg total) by mouth daily. 06/10/20   Panosh, Standley Brooking, MD  aspirin EC 81 MG tablet Take 81 mg by mouth daily. Swallow whole.    [provider]  Cholecalciferol (VITAMIN D-3 PO) Take 2,000 Units by mouth daily.    [provider]  lisinopril-hydrochlorothiazide (ZESTORETIC) 20-12.5 MG tablet Take 1 tablet by mouth daily. 06/10/20   Panosh, Standley Brooking, MD  magic mouthwash w/lidocaine SOLN Take 5 mLs  by mouth 3 (three) times daily as needed for mouth pain. Gargle and spit 05/14/20   Nafziger, Tommi Rumps, NP  Multiple Vitamin (MULTIVITAMIN) tablet Take 1 tablet by mouth daily.    [provider]    Allergies    Patient has no known allergies.  Review of Systems   Review of Systems  Constitutional: Negative for fever.  HENT: Negative for rhinorrhea and sore throat.   Eyes: Negative for redness.  Respiratory: Negative for cough.   Cardiovascular: Negative for chest pain.  Gastrointestinal: Positive for abdominal pain and blood in stool (dark stool). Negative for diarrhea, nausea and vomiting.  Genitourinary: Negative for dysuria, frequency, hematuria and urgency.  Musculoskeletal: Negative for myalgias.  Skin: Negative for rash.   Neurological: Negative for headaches.    Physical Exam Updated Vital Signs BP 135/88 (BP Location: Left Arm)   Pulse 70   Temp 97.7 F (36.5 C) (Oral)   Resp 16   SpO2 100%   Physical Exam Vitals and nursing note reviewed.  Constitutional:      General: She is not in acute distress.    Appearance: She is well-developed.  HENT:     Head: Normocephalic and atraumatic.     Right Ear: External ear normal.     Left Ear: External ear normal.     Nose: Nose normal.  Eyes:     Conjunctiva/sclera: Conjunctivae normal.  Cardiovascular:     Rate and Rhythm: Normal rate and regular rhythm.     Heart sounds: No murmur heard.   Pulmonary:     Effort: No respiratory distress.     Breath sounds: No wheezing, rhonchi or rales.  Abdominal:     Palpations: Abdomen is soft.     Tenderness: There is abdominal tenderness in the suprapubic area. There is no guarding or rebound.     Comments: Mild suprapubic tenderness  Genitourinary:    Rectum: No mass or tenderness.  Musculoskeletal:     Cervical back: Normal range of motion and neck supple.     Right lower leg: No edema.     Left lower leg: No edema.  Skin:    General: Skin is warm and dry.     Findings: No rash.  Neurological:     General: No focal deficit present.     Mental Status: She is alert. Mental status is at baseline.     Motor: No weakness.  Psychiatric:        Mood and Affect: Mood normal.     ED Results / Procedures / Treatments   Labs (all labs ordered are listed, but only abnormal results are displayed) Labs Reviewed  URINALYSIS, ROUTINE W REFLEX MICROSCOPIC - Abnormal; Notable for the following components:      Result Value   APPearance HAZY (*)    Ketones, ur 5 (*)    All other components within normal limits  LIPASE, BLOOD  COMPREHENSIVE METABOLIC PANEL  CBC  POC OCCULT BLOOD, ED    EKG None  Radiology CT ABDOMEN PELVIS W CONTRAST  Result Date: 07/30/2020 CLINICAL DATA:  Lower abdominal pain  for several days with black stools, initial encounter EXAM: CT ABDOMEN AND PELVIS WITH CONTRAST TECHNIQUE: Multidetector CT imaging of the abdomen and pelvis was performed using the standard protocol following bolus administration of intravenous contrast. CONTRAST:  151mL OMNIPAQUE IOHEXOL 300 MG/ML  SOLN COMPARISON:  None. FINDINGS: Lower chest: No acute abnormality. Hepatobiliary: Fatty infiltration of the liver is noted. The gallbladder is within normal  limits. Pancreas: Unremarkable. No pancreatic ductal dilatation or surrounding inflammatory changes. Spleen: Normal in size without focal abnormality. Adrenals/Urinary Tract: Adrenal glands are within normal limits. The kidneys demonstrate a normal enhancement pattern bilaterally. No renal calculi or obstructive changes are seen. The bladder is partially distended. Stomach/Bowel: Diverticular change of the colon is noted without evidence of diverticulitis. The appendix is well visualized and within normal limits. No inflammatory changes are seen. The stomach and small bowel are within normal limits. Vascular/Lymphatic: Aortic atherosclerosis. No enlarged abdominal or pelvic lymph nodes. Reproductive: No adnexal mass is noted. Uterus is well visualized. Central hypodensity is noted which measures between 13 and 16 mm in thickness. This is prominent for a patient of this age in postmenopausal state. Other: No abdominal wall hernia or abnormality. No abdominopelvic ascites. Musculoskeletal: Degenerative changes of lumbar spine are noted. IMPRESSION: Diverticulosis without diverticulitis. Prominent endometrial thickness as described. Nonemergent ultrasound is recommended for further evaluation. No findings to correspond with the patient's given clinical history. Electronically Signed   By: Inez Catalina M.D.   On: 07/30/2020 20:18    Procedures Procedures   Medications Ordered in ED Medications - No data to display  ED Course  I have reviewed the triage vital  signs and the nursing notes.  Pertinent labs & imaging results that were available during my care of the patient were reviewed by me and considered in my medical decision making (see chart for details).  Patient seen and examined.  Labs ordered in triage are reassuring.  Pending UA.  No obvious blood on digital rectal exam.  Vital signs reviewed and are as follows: BP 135/88 (BP Location: Left Arm)   Pulse 70   Temp 97.7 F (36.5 C) (Oral)   Resp 16   SpO2 100%   UA was largely unremarkable.  CT was ordered.  Patient updated.  9:16 PM patient updated on results.  Discussed that CT findings are largely reassuring.  Patient was informed of endometrial thickening which will need to be followed up by PCP/OBGYN.  Patient will follow-up with her primary care doctor call OB/GYN clinic listed.  The patient was urged to return to the Emergency Department immediately with worsening of current symptoms, worsening abdominal pain, persistent vomiting, blood noted in stools, fever, or any other concerns. The patient verbalized understanding.   She is feeling better and comfortable with discharge to home.    MDM Rules/Calculators/A&P                          Pt with lower abdominal pain. Vitals are stable, no fever. Labs are normal and UA without signs of infection. Imaging shows prominent uterine findings, otherwise no acute findings which would cause the patient's pain. No signs of dehydration, patient is tolerating PO's. Lungs are clear and no signs suggestive of PNA. Low concern for appendicitis, cholecystitis, pancreatitis, ruptured viscus, UTI, kidney stone, aortic dissection, aortic aneurysm or other emergent abdominal etiology. Supportive therapy indicated with return if symptoms worsen.    Final Clinical Impression(s) / ED Diagnoses Final diagnoses:  Lower abdominal pain    Rx / DC Orders ED Discharge Orders    None       Suann Larry 07/30/20 2118    Valarie Merino,  MD 07/30/20 2318

## 2020-07-30 NOTE — ED Triage Notes (Signed)
Pt presents to the ED with lower back pain and lower abdominal pain for 5-6 days with black tarry stool. Denies nausea.

## 2020-07-30 NOTE — Discharge Instructions (Signed)
Please read and follow all provided instructions.  Your diagnoses today include:  1. Lower abdominal pain     Tests performed today include:  Blood cell counts and platelets  Kidney and liver function tests  Pancreas function test (called lipase)  Urine test to look for infection  CT scan of the abdomen and pelvis - shows a thickened uterus lining  Vital signs. See below for your results today.   Medications prescribed:   None  Take any prescribed medications only as directed.  Home care instructions:   Follow any educational materials contained in this packet.  Follow-up instructions: Please follow-up with your primary care provider in the next several days to discuss your CT findings. You will likely need to see an OB/GYN for evaluation.  I have provided the information for our [facility] as well.   Return instructions:  SEEK IMMEDIATE MEDICAL ATTENTION IF:  The pain does not go away or becomes severe   A temperature above 101F develops   Repeated vomiting occurs (multiple episodes)   The pain becomes localized to portions of the abdomen. The right side could possibly be appendicitis. In an adult, the left lower portion of the abdomen could be colitis or diverticulitis.   Blood is being passed in stools or vomit (bright red or black tarry stools)   You develop chest pain, difficulty breathing, dizziness or fainting, or become confused, poorly responsive, or inconsolable (young children)  If you have any other emergent concerns regarding your health  Additional Information: Abdominal (belly) pain can be caused by many things. Your caregiver performed an examination and possibly ordered blood/urine tests and imaging (CT scan, x-rays, ultrasound). Many cases can be observed and treated at home after initial evaluation in the emergency department. Even though you are being discharged home, abdominal pain can be unpredictable. Therefore, you need a  repeated exam if your pain does not resolve, returns, or worsens. Most patients with abdominal pain don't have to be admitted to the hospital or have surgery, but serious problems like appendicitis and gallbladder attacks can start out as nonspecific pain. Many abdominal conditions cannot be diagnosed in one visit, so follow-up evaluations are very important.  Your vital signs today were: BP (!) 150/70   Pulse 68   Temp 97.7 F (36.5 C) (Oral)   Resp 15   SpO2 100%  If your blood pressure (bp) was elevated above 135/85 this visit, please have this repeated by your doctor within one month. --------------

## 2020-07-30 NOTE — ED Triage Notes (Signed)
Emergency Medicine Provider Triage Evaluation Note  Nina Le , a 80 y.o. female  was evaluated in triage.  Pt complains of back and abdominal pain times with black tarry stools. Not anticoagulated.   Review of Systems  Positive: Abdominal pain, dark stools Negative: SHOB, CP, urinary symptoms  Physical Exam  BP 140/67 (BP Location: Left Arm)   Pulse 78   Temp 98.8 F (37.1 C)   Resp 16   SpO2 100%  Gen:   Awake, no distress   HEENT:  Atraumatic  Resp:  Normal effort  Cardiac:  Normal rate  Abd:   Nondistended, suprapubic tenderness MSK:   Moves extremities without difficulty  Neuro:  Speech clear   Medical Decision Making  Medically screening exam initiated at 1:14 PM.  Appropriate orders placed.  Nina Le was informed that the remainder of the evaluation will be completed by another provider, this initial triage assessment does not replace that evaluation, and the importance of remaining in the ED until their evaluation is complete.  Clinical Impression     Roque Lias 07/30/20 1317

## 2020-07-30 NOTE — ED Notes (Signed)
Pt discharged and ambulated out of the ED without difficulty. 

## 2020-07-30 NOTE — ED Notes (Signed)
unsuccessful at obtaining IV access

## 2020-09-15 ENCOUNTER — Telehealth: Payer: Self-pay | Admitting: Internal Medicine

## 2020-09-15 DIAGNOSIS — R935 Abnormal findings on diagnostic imaging of other abdominal regions, including retroperitoneum: Secondary | ICD-10-CM

## 2020-09-15 DIAGNOSIS — R9389 Abnormal findings on diagnostic imaging of other specified body structures: Secondary | ICD-10-CM

## 2020-09-15 NOTE — Telephone Encounter (Signed)
Patient is calling and stated that she had a CT test scan on 4/21 and she received a letter that stated that they found some other findings outside of what was ordered. Patient wanted to see if provider had results. Informed patient that provider was out of the office and results may or may not be given until provider returns, please advise. CB is [phone]

## 2020-09-22 NOTE — Telephone Encounter (Signed)
CT scan of the abdomen done in April and the only thing I saw of significance would be increased lining of the endometrial stripe of the uterus. Some people need an ultrasound of the uterus to make sure there is no endometrial disease.  I suggest referral to GYN next evaluation. She agrees please refer to gynecology for endometrial abnormality on CT scan. Otherwise she can make an visit with me to discuss.   IMPRESSION: Diverticulosis without diverticulitis.   Prominent endometrial thickness as described. Nonemergent ultrasound is recommended for further evaluation.   No findings to correspond with the patient's given clinical history.

## 2020-09-23 NOTE — Telephone Encounter (Signed)
I spoke with the pt and informed her of the message below. Pt Is aware that referral has been placed to Gynecology and someone should be reaching out to her with appointment details.

## 2020-10-14 ENCOUNTER — Encounter: Payer: Self-pay | Admitting: Obstetrics & Gynecology

## 2020-10-14 ENCOUNTER — Other Ambulatory Visit: Payer: Self-pay

## 2020-10-14 ENCOUNTER — Ambulatory Visit: Payer: Medicare Other | Admitting: Obstetrics & Gynecology

## 2020-10-14 VITALS — BP 120/78 | HR 92 | Resp 16

## 2020-10-14 DIAGNOSIS — R9389 Abnormal findings on diagnostic imaging of other specified body structures: Secondary | ICD-10-CM

## 2020-10-14 NOTE — Progress Notes (Signed)
    Nina Le 05/27/1940 [id number]   History:    80 y.o. G3P3L3  RP:  New patient presenting for thickened endometrium/lesion on CT scan of pelvis  HPI: Postmenopause, well on no HRT.  No PMB.  Had a CT scan of the abdomen and pelvis on 07/30/2020 for lower abdominal/back pain which showed a thickened endometrium/lesion measured between 1.3 to 1.6 cm.  The lower abdominal/back pain resolved since then with a change in nutrition.  Urine and BMs normal.   Past medical history,surgical history, family history and social history were all reviewed and documented in the EPIC chart.  Gynecologic History No LMP recorded. Patient is postmenopausal.  Obstetric History OB History  Gravida Para Term Preterm AB Living  3         3  SAB IAB Ectopic Multiple Live Births               # Outcome Date GA Lbr Len/2nd Weight Sex Delivery Anes PTL Lv  3 Gravida           2 Gravida           1 Gravida              ROS: A ROS was performed and pertinent positives and negatives are included in the history.  GENERAL: No fevers or chills. HEENT: No change in vision, no earache, sore throat or sinus congestion. NECK: No pain or stiffness. CARDIOVASCULAR: No chest pain or pressure. No palpitations. PULMONARY: No shortness of breath, cough or wheeze. GASTROINTESTINAL: No abdominal pain, nausea, vomiting or diarrhea, melena or bright red blood per rectum. GENITOURINARY: No urinary frequency, urgency, hesitancy or dysuria. MUSCULOSKELETAL: No joint or muscle pain, no back pain, no recent trauma. DERMATOLOGIC: No rash, no itching, no lesions. ENDOCRINE: No polyuria, polydipsia, no heat or cold intolerance. No recent change in weight. HEMATOLOGICAL: No anemia or easy bruising or bleeding. NEUROLOGIC: No headache, seizures, numbness, tingling or weakness. PSYCHIATRIC: No depression, no loss of interest in normal activity or change in sleep pattern.     Exam:   BP 120/78   Pulse 92   Resp 16   There is  no height or weight on file to calculate BMI.  General appearance : Well developed well nourished female. No acute distress  Pelvic: Deferred  CT abdomen/pelvis 07/30/2020: Reproductive: No adnexal mass is noted. Uterus is well visualized. Central hypodensity is noted which measures between 13 and 16 mm in thickness. This is prominent for a patient of this age in postmenopausal state.   Assessment/Plan:  80 y.o. female   1. Thickened endometrium CT scan of the abdomen and pelvis in April 2022 showed a thickened endometrium measuring between 13 and 16 mm in thickness.  No postmenopausal bleeding.  We will proceed with a sonohysterogram to rule out an endometrial polyp and evaluate the endometrial thickness more precisely.  We will proceed with an endometrial biopsy at that time per findings.  Counseling on endometrial thickening done.  Patient voiced understanding and agrees with plan. - [place] Sonohysterogram; Future  Other orders - amLODipine (NORVASC) 5 MG tablet; Take 5 mg by mouth daily. - ibuprofen (ADVIL) 800 MG tablet; Take 800 mg by mouth every 8 (eight) hours as needed. - Acetaminophen (TYLENOL PO); Take by mouth.   Princess Bruins MD, 4:25 PM 10/14/2020

## 2020-12-22 ENCOUNTER — Other Ambulatory Visit: Payer: Medicare Other

## 2020-12-22 ENCOUNTER — Other Ambulatory Visit: Payer: Medicare Other | Admitting: Obstetrics & Gynecology

## 2020-12-29 ENCOUNTER — Other Ambulatory Visit: Payer: Self-pay

## 2020-12-29 ENCOUNTER — Other Ambulatory Visit: Payer: Medicare Other

## 2020-12-29 ENCOUNTER — Ambulatory Visit: Payer: Medicare Other

## 2020-12-29 ENCOUNTER — Ambulatory Visit (INDEPENDENT_AMBULATORY_CARE_PROVIDER_SITE_OTHER): Payer: Medicare Other | Admitting: Obstetrics & Gynecology

## 2020-12-29 ENCOUNTER — Encounter: Payer: Self-pay | Admitting: Obstetrics & Gynecology

## 2020-12-29 DIAGNOSIS — R9389 Abnormal findings on diagnostic imaging of other specified body structures: Secondary | ICD-10-CM

## 2020-12-29 DIAGNOSIS — N840 Polyp of corpus uteri: Secondary | ICD-10-CM | POA: Diagnosis not present

## 2020-12-29 NOTE — Progress Notes (Signed)
Nina Le 06/11/1940 [id number]        80 y.o.  G3P0 G3P3L3   RP: Thickened endometrium/lesion on CT scan of pelvis for Sonohysto   HPI: Postmenopause, well on no HRT.  No PMB.  Had a CT scan of the abdomen and pelvis on 07/30/2020 for lower abdominal/back pain which showed a thickened endometrium/lesion measured between 1.3 to 1.6 cm.  The lower abdominal/back pain resolved since then with a change in nutrition.  Urine and BMs normal.    OB History  Gravida Para Term Preterm AB Living  3         3  SAB IAB Ectopic Multiple Live Births               # Outcome Date GA Lbr Len/2nd Weight Sex Delivery Anes PTL Lv  3 Gravida           2 Gravida           1 Gravida             Past medical history,surgical history, problem list, medications, allergies, family history and social history were all reviewed and documented in the EPIC chart.   Directed ROS with pertinent positives and negatives documented in the history of present illness/assessment and plan.  Exam:  There were no vitals filed for this visit. General appearance:  Normal                                                                    Sono Infusion Hysterogram ( procedure note)   The initial transvaginal ultrasound demonstrated the following: T/V images.  Anteverted uterus normal size and shape.  The uterus is measured at 7.34 x 5.46 x 3.97 cm.  Small intramural and subserosal fibroids visualized, the largest of which is subserosal measured at 2.4 x 1.1 cm.  The endometrial lining is thickened and heterogenous measured at 14.62 mm.  Bilateral ovaries are atrophic in appearance.  No adnexal mass seen.  No free fluid in the posterior cul-de-sac.  The speculum  was inserted and the cervix cleansed with Betadine solution after confirming that patient has no allergies.A small sonohysterography catheterwas utilized.  Insertion was facilitated with ring forceps, using a spear-like motion the catheter was inserted to  the fundus of the uterus. The speculum is then removed carefully to avoid dislodging the catheter. The catheter was flushed with sterile saline delete prior to insertion to rid it of small amounts of air.the sterile saline solution was infused into the uterine cavity as a vaginal ultrasound probe was then placed in the vagina for full visualization of the uterine cavity from a transvaginal approach. The following was noted: Thicken heterogeneous endometrium.  Saline infusion reveals an intracavitary mass compatible with an endometrial polyp.   The catheter was then removed after retrieving some of the saline from the intrauterine cavity. An endometrial biopsy was not done. Patient tolerated procedure well. She had received a tablet of Aleve for discomfort.    Assessment/Plan:  80 y.o. G3P0   1. Thickened endometrium No postmenopausal bleeding, incidental finding of a thickened endometrium on CT scan.  Sonohysterogram done today showing an intra uterine lesion compatible with a polyp.  2. Endometrial polyp  Sonohysterogram revealing a thickened  atherogenesis endometrium with an intracavitary mass compatible with an endometrial polyp.  Decision to proceed with a HSC/Myosure Excision/D+C.  Information about surgery and pamphlet given to patient.  F/U Preop.  Princess Bruins MD, 3:05 PM 12/29/2020

## 2020-12-30 ENCOUNTER — Telehealth: Payer: Self-pay

## 2020-12-30 NOTE — Telephone Encounter (Signed)
-----   Message from Princess Bruins, MD sent at 12/29/2020  3:27 PM EDT ----- Regarding: Schedule Surgery Surgery:  Hysteroscopy, Myosure Excision, Dilation and Curettage  Diagnosis:  Thickened Endometrium/Endometrial Polyp  Location: [place]  Status: Outpatient  Time: 30 Minutes  Assistant: N/A  Urgency: First Available  Pre-Op Appointment: To Be Scheduled  Post-Op Appointment(s): 2 Weeks,   Time Out Of Work: Day Of Surgery ONLY

## 2020-12-31 NOTE — Telephone Encounter (Signed)
Spoke with patient. Reviewed surgery dates. Patient request to proceed with surgery on 01/13/21.  Advised patient I will forward to business office for return call. I will return call once surgery date and time confirmed. Patient verbalizes understanding and is agreeable.   Surgery request sent.

## 2021-01-01 NOTE — Telephone Encounter (Signed)
Spoke with patient. Surgery date request confirmed.  Advised surgery is scheduled for [facility], 01/13/21 at 0830.  Surgery instruction sheet and hospital brochure reviewed, printed copy will be picked up in office at pre-op visit on 01/01/21. Patient is planning to travel for a wedding 9/13-9/17, request post-op visit prior to travel. Post-op scheduled for 01/20/21.   Patient advised if Covid screening and quarantine requirements and agreeable.   Routing to [employer]. May close encounter once benefits reviewed.

## 2021-01-04 ENCOUNTER — Other Ambulatory Visit: Payer: Self-pay

## 2021-01-04 ENCOUNTER — Encounter: Payer: Self-pay | Admitting: Obstetrics & Gynecology

## 2021-01-04 ENCOUNTER — Ambulatory Visit (INDEPENDENT_AMBULATORY_CARE_PROVIDER_SITE_OTHER): Payer: Medicare Other | Admitting: Obstetrics & Gynecology

## 2021-01-04 VITALS — BP 132/84 | HR 99 | Resp 20 | Ht 64.17 in | Wt 247.6 lb

## 2021-01-04 DIAGNOSIS — N840 Polyp of corpus uteri: Secondary | ICD-10-CM

## 2021-01-04 DIAGNOSIS — R9389 Abnormal findings on diagnostic imaging of other specified body structures: Secondary | ICD-10-CM

## 2021-01-04 NOTE — Telephone Encounter (Signed)
Spoke with patient regarding surgery benefits. Patient acknowledges understanding of information presented. Patient is aware that benefits presented are professional benefits only. Patient is aware the hospital will call with separate benefits. See account note.  Encounter closed.

## 2021-01-04 NOTE — Progress Notes (Signed)
[place] Alston Shader 10/08/1940 [id number]        80 y.o.  G3P3L3  RP:  Preop for probable Endometrial Polyp for HSC/Myosure Excision/D+C   HPI: Postmenopause, well on no HRT.  No PMB.  Had a CT scan 07/2020 showing a thickened endometrium/lesion measured between 1.3 to 1.6 cm.  The lower abdominal/back pain resolved since then with a change in nutrition.  Sonohysto with IU lesion c/w a Polyp.  Urine and BMs normal.    OB History  Gravida Para Term Preterm AB Living  3         3  SAB IAB Ectopic Multiple Live Births               # Outcome Date GA Lbr Len/2nd Weight Sex Delivery Anes PTL Lv  3 Gravida           2 Gravida           1 Gravida             Past medical history,surgical history, problem list, medications, allergies, family history and social history were all reviewed and documented in the EPIC chart.   Directed ROS with pertinent positives and negatives documented in the history of present illness/assessment and plan.  Exam:  Vitals:   01/04/21 1609  BP: 132/84  Pulse: 99  Resp: 20  Weight: 247 lb 9.6 oz (112.3 kg)  Height: 5' 4.17" (1.63 m)   General appearance:  Normal   Sono Infusion Hysterogram ( procedure note)     The initial transvaginal ultrasound demonstrated the following: T/V images.  Anteverted uterus normal size and shape.  The uterus is measured at 7.34 x 5.46 x 3.97 cm.  Small intramural and subserosal fibroids visualized, the largest of which is subserosal measured at 2.4 x 1.1 cm.  The endometrial lining is thickened and heterogenous measured at 14.62 mm.  Bilateral ovaries are atrophic in appearance.  No adnexal mass seen.  No free fluid in the posterior cul-de-sac.   The speculum  was inserted and the cervix cleansed with Betadine solution after confirming that patient has no allergies.A small sonohysterography catheterwas utilized.  Insertion was facilitated with ring forceps, using a spear-like motion the catheter was inserted to the  fundus of the uterus. The speculum is then removed carefully to avoid dislodging the catheter. The catheter was flushed with sterile saline delete prior to insertion to rid it of small amounts of air.the sterile saline solution was infused into the uterine cavity as a vaginal ultrasound probe was then placed in the vagina for full visualization of the uterine cavity from a transvaginal approach. The following was noted: Thicken heterogeneous endometrium.  Saline infusion reveals an intracavitary mass compatible with an endometrial polyp.     The catheter was then removed after retrieving some of the saline from the intrauterine cavity. An endometrial biopsy was not done. Patient tolerated procedure well. She had received a tablet of Aleve for discomfort.     Assessment/Plan:  80 y.o. G3P0    1. Thickened endometrium No postmenopausal bleeding, incidental finding of a thickened endometrium on CT scan.  Sonohysterogram done today showing an intra uterine lesion compatible with a polyp.   2. Endometrial polyp  Sonohysterogram revealing a thickened endometrium with an intracavitary mass compatible with an endometrial polyp.  Decision to proceed with a HSC/Myosure Excision/D+C.  Preop preparation, surgery and risks including the risks of uterine perforation and the risks described below thoroughly discussed with patient, postop  precautions and expectations reviewed.  Patient voiced understanding and agreement with plan.                         Patient was counseled as to the risk of surgery to include the following:  1. Infection (prohylactic antibiotics will be administered)  2. DVT/Pulmonary Embolism (prophylactic pneumo compression stockings will be used)  3.Trauma to internal organs requiring additional surgical procedure to repair any injury to internal organs requiring perhaps additional hospitalization days.  4.Hemmorhage requiring transfusion and blood products which carry risks such as  anaphylactic reaction, hepatitis and AIDS  Patient had received literature information on the procedure scheduled and all her questions were answered and fully accepts all risk.   Princess Bruins MD, 4:31 PM 01/04/2021

## 2021-01-08 ENCOUNTER — Encounter (HOSPITAL_BASED_OUTPATIENT_CLINIC_OR_DEPARTMENT_OTHER): Payer: Self-pay | Admitting: Obstetrics & Gynecology

## 2021-01-08 ENCOUNTER — Other Ambulatory Visit: Payer: Self-pay

## 2021-01-08 NOTE — Progress Notes (Addendum)
Spoke w/ via phone for pre-op interview---pt Lab needs dos----               Lab results------ pt getting lab done 01-11-2021, CBC, BMP, EKG COVID test -----patient states asymptomatic no test needed Arrive at ------- 0630 on 01-13-2021 NPO after MN NO Solid Food.  Clear liquids from MN until--- 0530 Med rec completed Medications to take morning of surgery ----- norvasc Diabetic medication ----- n/a Patient instructed no nail polish to be worn day of surgery Patient instructed to bring photo id and insurance card day of surgery Patient aware to have Driver (ride ) / caregiver for 24 hours after surgery --daughter, [place] Knaggs ross Patient Special Instructions ----- n/a Pre-Op special Istructions ----- n/a Patient verbalized understanding of instructions that were given at this phone interview. Patient denies shortness of breath, chest pain, fever, cough at this phone interview.   Pt had questioned about a card she had been given to her from cardiologist Dr Eustace Quail in 11/ 2006 stating dx aortic scerlosis / mild MR and would needsSBE antibiotic coverage prior to dental procedures to prevent bacterial endocarditis. Researched epic and found office note scanned in media dated 11-26-2003 by Dr B. Brodie. In note he states about giving SBE card and why. Pt verbalized understanding that at that time per cardiology guidelines antibiotics were given however few years after that guidelines were updated and pt's no longer needed antibiotic coverage for her diagnosis.

## 2021-01-11 ENCOUNTER — Other Ambulatory Visit: Payer: Self-pay

## 2021-01-11 ENCOUNTER — Encounter (HOSPITAL_COMMUNITY)
Admission: RE | Admit: 2021-01-11 | Discharge: 2021-01-11 | Disposition: A | Discharge status: Home / Self Care | Payer: Medicare Other | Source: Ambulatory Visit | Attending: Obstetrics & Gynecology | Admitting: Obstetrics & Gynecology

## 2021-01-11 DIAGNOSIS — Z01818 Encounter for other preprocedural examination: Secondary | ICD-10-CM | POA: Diagnosis not present

## 2021-01-11 LAB — CBC
HCT: 40.2 % (ref 36.0–46.0)
Hemoglobin: 12.9 g/dL (ref 12.0–15.0)
MCH: 27.9 pg (ref 26.0–34.0)
MCHC: 32.1 g/dL (ref 30.0–36.0)
MCV: 87 fL (ref 80.0–100.0)
Platelets: 256 10*3/uL (ref 150–400)
RBC: 4.62 MIL/uL (ref 3.87–5.11)
RDW: 14.2 % (ref 11.5–15.5)
WBC: 7.9 10*3/uL (ref 4.0–10.5)
nRBC: 0 % (ref 0.0–0.2)

## 2021-01-11 LAB — BASIC METABOLIC PANEL
Anion gap: 9 (ref 5–15)
BUN: 24 mg/dL — ABNORMAL HIGH (ref 8–23)
CO2: 27 mmol/L (ref 22–32)
Calcium: 9.9 mg/dL (ref 8.9–10.3)
Chloride: 107 mmol/L (ref 98–111)
Creatinine, Ser: 0.81 mg/dL (ref 0.44–1.00)
GFR, Estimated: >60 mL/min (ref >60)
Glucose, Bld: 100 mg/dL — ABNORMAL HIGH (ref 70–99)
Potassium: 4.1 mmol/L (ref 3.5–5.1)
Sodium: 143 mmol/L (ref 135–145)

## 2021-01-13 ENCOUNTER — Ambulatory Visit (HOSPITAL_BASED_OUTPATIENT_CLINIC_OR_DEPARTMENT_OTHER)
Admission: RE | Admit: 2021-01-13 | Discharge: 2021-01-13 | Disposition: A | Discharge status: Home / Self Care | Payer: Medicare Other | Attending: Obstetrics & Gynecology | Admitting: Obstetrics & Gynecology

## 2021-01-13 ENCOUNTER — Ambulatory Visit (HOSPITAL_BASED_OUTPATIENT_CLINIC_OR_DEPARTMENT_OTHER): Payer: Medicare Other | Admitting: Anesthesiology

## 2021-01-13 ENCOUNTER — Encounter (HOSPITAL_BASED_OUTPATIENT_CLINIC_OR_DEPARTMENT_OTHER)
Admission: RE | Disposition: A | Discharge status: Home / Self Care | Payer: Self-pay | Source: Home / Self Care | Attending: Obstetrics & Gynecology

## 2021-01-13 ENCOUNTER — Encounter (HOSPITAL_BASED_OUTPATIENT_CLINIC_OR_DEPARTMENT_OTHER): Payer: Self-pay | Admitting: Obstetrics & Gynecology

## 2021-01-13 DIAGNOSIS — I340 Nonrheumatic mitral (valve) insufficiency: Secondary | ICD-10-CM | POA: Diagnosis not present

## 2021-01-13 DIAGNOSIS — R9389 Abnormal findings on diagnostic imaging of other specified body structures: Secondary | ICD-10-CM | POA: Diagnosis not present

## 2021-01-13 DIAGNOSIS — D250 Submucous leiomyoma of uterus: Secondary | ICD-10-CM | POA: Diagnosis not present

## 2021-01-13 DIAGNOSIS — Z79899 Other long term (current) drug therapy: Secondary | ICD-10-CM | POA: Insufficient documentation

## 2021-01-13 DIAGNOSIS — Z8616 Personal history of COVID-19: Secondary | ICD-10-CM | POA: Diagnosis not present

## 2021-01-13 DIAGNOSIS — N840 Polyp of corpus uteri: Secondary | ICD-10-CM | POA: Diagnosis not present

## 2021-01-13 DIAGNOSIS — Z7982 Long term (current) use of aspirin: Secondary | ICD-10-CM | POA: Diagnosis not present

## 2021-01-13 DIAGNOSIS — E785 Hyperlipidemia, unspecified: Secondary | ICD-10-CM | POA: Diagnosis not present

## 2021-01-13 HISTORY — DX: Unspecified osteoarthritis, unspecified site: M19.90

## 2021-01-13 HISTORY — DX: Abnormal findings on diagnostic imaging of other specified body structures: R93.89

## 2021-01-13 HISTORY — DX: Presence of dental prosthetic device (complete) (partial): Z97.2

## 2021-01-13 HISTORY — DX: Nonrheumatic mitral (valve) insufficiency: I34.0

## 2021-01-13 HISTORY — DX: Presence of spectacles and contact lenses: Z97.3

## 2021-01-13 HISTORY — PX: DILATATION & CURETTAGE/HYSTEROSCOPY WITH MYOSURE: SHX6511

## 2021-01-13 HISTORY — DX: Other chronic pain: G89.29

## 2021-01-13 SURGERY — DILATATION & CURETTAGE/HYSTEROSCOPY WITH MYOSURE
Anesthesia: General | Site: Uterus

## 2021-01-13 MED ORDER — LACTATED RINGERS IV SOLN: INTRAVENOUS | Status: DC

## 2021-01-13 MED ORDER — FENTANYL CITRATE (PF) 100 MCG/2ML IJ SOLN
25.0000 ug | INTRAMUSCULAR | Status: DC | PRN
  Administered 2021-01-13: 50 ug via INTRAVENOUS
  Administered 2021-01-13 (×2): 25 ug via INTRAVENOUS

## 2021-01-13 MED ORDER — FENTANYL CITRATE (PF) 100 MCG/2ML IJ SOLN
INTRAMUSCULAR | Status: AC
  Filled 2021-01-13: qty 2

## 2021-01-13 MED ORDER — PROPOFOL 10 MG/ML IV BOLUS
INTRAVENOUS | Status: AC
  Filled 2021-01-13: qty 20

## 2021-01-13 MED ORDER — LIDOCAINE HCL 1 % IJ SOLN
INTRAMUSCULAR | Status: DC | PRN
  Administered 2021-01-13: 10 mL

## 2021-01-13 MED ORDER — FENTANYL CITRATE (PF) 100 MCG/2ML IJ SOLN
INTRAMUSCULAR | Status: DC | PRN
  Administered 2021-01-13: 50 ug via INTRAVENOUS

## 2021-01-13 MED ORDER — PHENYLEPHRINE 40 MCG/ML (10ML) SYRINGE FOR IV PUSH (FOR BLOOD PRESSURE SUPPORT)
PREFILLED_SYRINGE | INTRAVENOUS | Status: DC | PRN
  Administered 2021-01-13: 120 ug via INTRAVENOUS
  Administered 2021-01-13 (×2): 80 ug via INTRAVENOUS

## 2021-01-13 MED ORDER — LIDOCAINE 2% (20 MG/ML) 5 ML SYRINGE
INTRAMUSCULAR | Status: AC
  Filled 2021-01-13: qty 5

## 2021-01-13 MED ORDER — PHENYLEPHRINE 40 MCG/ML (10ML) SYRINGE FOR IV PUSH (FOR BLOOD PRESSURE SUPPORT)
PREFILLED_SYRINGE | INTRAVENOUS | Status: AC
  Filled 2021-01-13: qty 10

## 2021-01-13 MED ORDER — ACETAMINOPHEN 10 MG/ML IV SOLN: 1000.0000 mg | Freq: Once | INTRAVENOUS | Status: DC | PRN

## 2021-01-13 MED ORDER — ONDANSETRON HCL 4 MG/2ML IJ SOLN
INTRAMUSCULAR | Status: AC
  Filled 2021-01-13: qty 2

## 2021-01-13 MED ORDER — MIDAZOLAM HCL 2 MG/2ML IJ SOLN
INTRAMUSCULAR | Status: AC
  Filled 2021-01-13: qty 2

## 2021-01-13 MED ORDER — LIDOCAINE 2% (20 MG/ML) 5 ML SYRINGE
INTRAMUSCULAR | Status: DC | PRN
  Administered 2021-01-13: 100 mg via INTRAVENOUS

## 2021-01-13 MED ORDER — PROPOFOL 10 MG/ML IV BOLUS
INTRAVENOUS | Status: DC | PRN
  Administered 2021-01-13: 150 mg via INTRAVENOUS

## 2021-01-13 MED ORDER — ONDANSETRON HCL 4 MG/2ML IJ SOLN: 4.0000 mg | Freq: Once | INTRAMUSCULAR | Status: DC | PRN

## 2021-01-13 MED ORDER — POVIDONE-IODINE 10 % EX SWAB: 2.0000 "application " | Freq: Once | CUTANEOUS | Status: DC

## 2021-01-13 MED ORDER — ONDANSETRON HCL 4 MG/2ML IJ SOLN
INTRAMUSCULAR | Status: DC | PRN
  Administered 2021-01-13: 4 mg via INTRAVENOUS

## 2021-01-13 MED ORDER — DEXAMETHASONE SODIUM PHOSPHATE 10 MG/ML IJ SOLN
INTRAMUSCULAR | Status: AC
  Filled 2021-01-13: qty 1

## 2021-01-13 MED ORDER — CEFAZOLIN SODIUM-DEXTROSE 2-4 GM/100ML-% IV SOLN
2.0000 g | INTRAVENOUS | Status: AC
  Administered 2021-01-13: 2 g via INTRAVENOUS

## 2021-01-13 MED ORDER — DEXAMETHASONE SODIUM PHOSPHATE 10 MG/ML IJ SOLN
INTRAMUSCULAR | Status: DC | PRN
  Administered 2021-01-13: 4 mg via INTRAVENOUS

## 2021-01-13 MED ORDER — CEFAZOLIN SODIUM-DEXTROSE 2-4 GM/100ML-% IV SOLN
INTRAVENOUS | Status: AC
  Filled 2021-01-13: qty 100

## 2021-01-13 SURGICAL SUPPLY — 20 items
CATH ROBINSON RED A/P 16FR (CATHETERS) ×2 IMPLANT
DEVICE MYOSURE LITE (MISCELLANEOUS) IMPLANT
DEVICE MYOSURE REACH (MISCELLANEOUS) ×1 IMPLANT
DILATOR CANAL MILEX (MISCELLANEOUS) IMPLANT
ELECT REM PT RETURN 9FT ADLT (ELECTROSURGICAL)
ELECTRODE REM PT RTRN 9FT ADLT (ELECTROSURGICAL) IMPLANT
GAUZE 4X4 16PLY ~~LOC~~+RFID DBL (SPONGE) ×4 IMPLANT
GLOVE SURG ENC MOIS LTX SZ6.5 (GLOVE) ×2 IMPLANT
GLOVE SURG UNDER POLY LF SZ7 (GLOVE) ×4 IMPLANT
GOWN STRL REUS W/TWL LRG LVL3 (GOWN DISPOSABLE) ×4 IMPLANT
IV NS IRRIG 3000ML ARTHROMATIC (IV SOLUTION) ×2 IMPLANT
KIT PROCEDURE FLUENT (KITS) ×2 IMPLANT
KIT TURNOVER CYSTO (KITS) ×2 IMPLANT
MYOSURE XL FIBROID (MISCELLANEOUS)
PACK VAGINAL MINOR WOMEN LF (CUSTOM PROCEDURE TRAY) ×2 IMPLANT
PAD OB MATERNITY 4.3X12.25 (PERSONAL CARE ITEMS) ×2 IMPLANT
PAD PREP 24X48 CUFFED NSTRL (MISCELLANEOUS) ×2 IMPLANT
SEAL CERVICAL OMNI LOK (ABLATOR) IMPLANT
SEAL ROD LENS SCOPE MYOSURE (ABLATOR) ×2 IMPLANT
SYSTEM TISS REMOVAL MYOSURE XL (MISCELLANEOUS) IMPLANT

## 2021-01-13 NOTE — H&P (Signed)
Nina Le is an 80 y.o. female G3P3L3   RP: [facility] D+C, Myosure Excision for Endometrial Polyp   HPI: No change x last visit 12/29/2020. Postmenopause, well on no HRT.  No PMB.  Had a CT scan of the abdomen and pelvis on 07/30/2020 for lower abdominal/back pain which showed a thickened endometrium/lesion measured between 1.3 to 1.6 cm.  Sonohysto 12/29/20 showed an IU mass c/w Polyp.  The lower abdominal/back pain resolved since then with a change in nutrition.  Urine and BMs normal.    Menstrual History: No LMP recorded. Patient is postmenopausal.  Past Medical History:  Diagnosis Date   Chronic back pain    History of COVID-19 05/2020   pt's pcp note in epic 05-14-2020 stated pt has covid;  per pt mild symptoms that resolved   Hyperlipidemia    Hypertension    followed by pcp   Mild mitral regurgitation    per echo in epic 03-16-2010, mild LVH, ef 55-65%, G1DD, mild MR with calcified annulus and no stenosis, trivial TR/ PR, mild AV thickened calicified leaflets but no scerlosis, no stenosis, no regurg   OA (osteoarthritis)    Thickened endometrium    Wears dentures    Wears glasses     Past Surgical History:  Procedure Laterality Date   CARDIAC CATHETERIZATION  10/21/2003   @[facility]  by dr Darnell Level brodie;  normal coronary angiography , LVF, and wall motion, ef 65%  (abnormal ekg suggest infart and pre-op eval priot to back surgery)   COLONOSCOPY     last one approx 2017   KNEE ARTHROSCOPY W/ MENISCAL REPAIR Right 02/16/2004   @[facility]    LUMBAR DISC SURGERY  11/12/2003   @[facility]  by Dr. Lorin Mercy;  L5--S1   TONSILLECTOMY     child    Family History  Problem Relation Age of Onset   Arthritis Mother    Hypertension Mother    Breast cancer Mother        cancer in the blood   Heart disease Father    Breast cancer Sister    Colon cancer Neg Hx     Social History:  reports that she has never smoked. She has never used smokeless tobacco. She reports that she does not drink alcohol and does  not use drugs.  Allergies: No Known Allergies  Medications Prior to Admission  Medication Sig Dispense Refill Last Dose   Acetaminophen (TYLENOL PO) Take by mouth as needed.      amLODipine (NORVASC) 5 MG tablet Take 5 mg by mouth daily.      ibuprofen (ADVIL) 800 MG tablet Take 800 mg by mouth every 8 (eight) hours as needed.   Past Week   lisinopril-hydrochlorothiazide (ZESTORETIC) 20-12.5 MG tablet Take 1 tablet by mouth daily. (Patient taking differently: Take 1 tablet by mouth daily.) 90 tablet 3    Multiple Vitamin (MULTIVITAMIN) tablet Take 1 tablet by mouth daily.      VITAMIN E COMPLEX PO Take by mouth every other day.   01/04/2021   aspirin EC 81 MG tablet Take 81 mg by mouth daily. Swallow whole.   01/04/2021    REVIEW OF SYSTEMS: A ROS was performed and pertinent positives and negatives are included in the history.  GENERAL: No fevers or chills. HEENT: No change in vision, no earache, sore throat or sinus congestion. NECK: No pain or stiffness. CARDIOVASCULAR: No chest pain or pressure. No palpitations. PULMONARY: No shortness of breath, cough or wheeze. GASTROINTESTINAL: No abdominal pain, nausea, vomiting  or diarrhea, melena or bright red blood per rectum. GENITOURINARY: No urinary frequency, urgency, hesitancy or dysuria. MUSCULOSKELETAL: No joint or muscle pain, no back pain, no recent trauma. DERMATOLOGIC: No rash, no itching, no lesions. ENDOCRINE: No polyuria, polydipsia, no heat or cold intolerance. No recent change in weight. HEMATOLOGICAL: No anemia or easy bruising or bleeding. NEUROLOGIC: No headache, seizures, numbness, tingling or weakness. PSYCHIATRIC: No depression, no loss of interest in normal activity or change in sleep pattern.     Height 5' 4.5" (1.638 m), weight 112.3 kg.  Physical Exam:  Sono Infusion Hysterogram ( procedure note)     The initial transvaginal ultrasound demonstrated the following: T/V images.  Anteverted uterus normal size and shape.  The  uterus is measured at 7.34 x 5.46 x 3.97 cm.  Small intramural and subserosal fibroids visualized, the largest of which is subserosal measured at 2.4 x 1.1 cm.  The endometrial lining is thickened and heterogenous measured at 14.62 mm.  Bilateral ovaries are atrophic in appearance.  No adnexal mass seen.  No free fluid in the posterior cul-de-sac.   The speculum  was inserted and the cervix cleansed with Betadine solution after confirming that patient has no allergies.A small sonohysterography catheterwas utilized.  Insertion was facilitated with ring forceps, using a spear-like motion the catheter was inserted to the fundus of the uterus. The speculum is then removed carefully to avoid dislodging the catheter. The catheter was flushed with sterile saline delete prior to insertion to rid it of small amounts of air.the sterile saline solution was infused into the uterine cavity as a vaginal ultrasound probe was then placed in the vagina for full visualization of the uterine cavity from a transvaginal approach. The following was noted: Thicken heterogeneous endometrium.  Saline infusion reveals an intracavitary mass compatible with an endometrial polyp.   The catheter was then removed after retrieving some of the saline from the intrauterine cavity. An endometrial biopsy was not done. Patient tolerated procedure well. She had received a tablet of Aleve for discomfort.     Assessment/Plan:  80 y.o. G3P0    1. Thickened endometrium No postmenopausal bleeding, incidental finding of a thickened endometrium on CT scan.  Sonohysterogram done today showing an intra uterine lesion compatible with a polyp.   2. Endometrial polyp  Sonohysterogram revealing a thickened atherogenesis endometrium with an intracavitary mass compatible with an endometrial polyp.  Decision to proceed with a HSC/Myosure Excision/D+C.  Information about surgery and pamphlet given to patient.                          Patient was  counseled as to the risk of surgery to include the following:  1. Infection (prohylactic antibiotics will be administered)  2. DVT/Pulmonary Embolism (prophylactic pneumo compression stockings will be used)  3.Trauma to internal organs requiring additional surgical procedure to repair any injury to internal organs requiring perhaps additional hospitalization days.  4.Hemmorhage requiring transfusion and blood products which carry risks such as anaphylactic reaction, hepatitis and AIDS  Patient had received literature information on the procedure scheduled and all her questions were answered and fully accepts all risk.   Marie-Lyne Colbie Sliker 01/13/2021, 6:48 AM

## 2021-01-13 NOTE — Anesthesia Procedure Notes (Signed)
Procedure Name: LMA Insertion Date/Time: 01/13/2021 8:38 AM Performed by: Bonney Aid, CRNA Pre-anesthesia Checklist: Patient identified, Emergency Drugs available, Suction available and Patient being monitored Patient Re-evaluated:Patient Re-evaluated prior to induction Oxygen Delivery Method: Circle system utilized Preoxygenation: Pre-oxygenation with 100% oxygen Induction Type: IV induction Ventilation: Mask ventilation without difficulty LMA: LMA inserted LMA Size: 4.0 Number of attempts: 1 Placement Confirmation: positive ETCO2 Tube secured with: Tape Dental Injury: Teeth and Oropharynx as per pre-operative assessment

## 2021-01-13 NOTE — Discharge Instructions (Addendum)
DISCHARGE INSTRUCTIONS: D&C / D&E The following instructions have been prepared to help you care for yourself upon your return home.   Personal hygiene:  Use sanitary pads for vaginal drainage, not tampons.  Shower the day after your procedure.  NO tub baths, pools or Jacuzzis for 2-3 weeks.  Wipe front to back after using the bathroom.  Activity and limitations:  Do NOT drive or operate any equipment for 24 hours. The effects of anesthesia are still present and drowsiness may result.  Do NOT rest in bed all day.  Walking is encouraged.  Walk up and down stairs slowly.  You may resume your normal activity in one to two days or as indicated by your physician.  Sexual activity: NO intercourse for at least 2 weeks after the procedure, or as indicated by your physician.  Diet: Eat a light meal as desired this evening. You may resume your usual diet tomorrow.  Return to work: You may resume your work activities in one to two days or as indicated by your doctor.  What to expect after your surgery: Expect to have vaginal bleeding/discharge for 2-3 days and spotting for up to 10 days. It is not unusual to have soreness for up to 1-2 weeks. You may have a slight burning sensation when you urinate for the first day. Mild cramps may continue for a couple of days. You may have a regular period in 2-6 weeks.  Call your doctor for any of the following:  Excessive vaginal bleeding, saturating and changing one pad every hour.  Inability to urinate 6 hours after discharge from hospital.  Pain not relieved by pain medication.  Fever of 100.4 F or greater.  Unusual vaginal discharge or odor.   Call for an appointment:      Post Anesthesia Home Care Instructions  Activity: Get plenty of rest for the remainder of the day. A responsible adult should stay with you for 24 hours following the procedure.  For the next 24 hours, DO NOT: -Drive a car -Operate machinery -Drink alcoholic  beverages -Take any medication unless instructed by your physician -Make any legal decisions or sign important papers.  Meals: Start with liquid foods such as gelatin or soup. Progress to regular foods as tolerated. Avoid greasy, spicy, heavy foods. If nausea and/or vomiting occur, drink only clear liquids until the nausea and/or vomiting subsides. Call your physician if vomiting continues.  Special Instructions/Symptoms: Your throat may feel dry or sore from the anesthesia or the breathing tube placed in your throat during surgery. If this causes discomfort, gargle with warm salt water. The discomfort should disappear within 24 hours.   

## 2021-01-13 NOTE — Transfer of Care (Signed)
Immediate Anesthesia Transfer of Care Note  Patient: Nina Le  Procedure(s) Performed: DILATATION & CURETTAGE/HYSTEROSCOPY WITH MYOSURE (Uterus)  Patient Location: PACU  Anesthesia Type:General  Level of Consciousness: drowsy  Airway & Oxygen Therapy: Patient Spontanous Breathing and Patient connected to nasal cannula oxygen  Post-op Assessment: Report given to RN  Post vital signs: Reviewed and stable  Last Vitals:  Vitals Value Taken Time  BP 142/84 01/13/21 0922  Temp    Pulse 65 01/13/21 0922  Resp 16 01/13/21 0922  SpO2 100 % 01/13/21 0922    Last Pain:  Vitals:   01/13/21 0706  TempSrc: Oral      Patients Stated Pain Goal: 5 (15/94/58 5929)  Complications: No notable events documented.

## 2021-01-13 NOTE — Op Note (Addendum)
Operative Note  01/13/2021  9:53 AM  PATIENT:  Nina Le  80 y.o. female  PRE-OPERATIVE DIAGNOSIS:  thickened endometrium, endometrial polyp  POST-OPERATIVE DIAGNOSIS:  thickened endometrium, submucosal fibroid  PROCEDURE:  Procedure(s): DILATATION & CURETTAGE/HYSTEROSCOPY WITH MYOSURE  SURGEON:  Surgeon(s): Princess Bruins, MD  ANESTHESIA:   general  FINDINGS: 1.5 cm submucosal fibroid at the lower posterior intrauterine wall.  DESCRIPTION OF OPERATION: Under general anesthesia with laryngeal mask, the patient is in lithotomy position.  She is prepped with Betadine on the suprapubic, vulvar and vaginal areas.  The bladder is catheterized.  The patient is draped as usual.  Timeout is done.  The vaginal exam reveals a small intermediate uterus with no adnexal mass.  The speculum is put in place in the vagina.  The anterior lip of the cervix is grasped with a tenaculum.  A paracervical block is done with Xylocaine 1% a total of 10 cc at 4 and 8:00.  Dilation of the cervix with Pratt dilators up to #19 without difficulty.  The hysteroscope was inserted in the intra uterine cavity and inspection reveals 2 normal ostia, a submucosal fibroid measuring about 1.5 cm at the lower posterior wall of the intra uterine cavity.  A small area of thickening is present close to the right ostium.  The endometrium is otherwise thin with no increase in vascularity.  Pictures are taken.  The reach MyoSure is inserted.  Complete excision of the submucosal fibroid and the area of thickening close to the right ostium.  Hemostasis is adequate.  Pictures are taken after excisions.  The hysteroscope with MyoSure reach are removed.  We proceeded with a systematic curettage of the intra uterine cavity on all surfaces with a sharp curette.  The curette is removed from the intra uterine cavity.  The endometrial curettings are sent to pathology together with the excision material from the submucosal fibroid.  The  tenaculum is removed from the cervix.  Hemostasis is adequate.  The speculum is removed.  The patient is brought to recovery room in good and stable status.  ESTIMATED BLOOD LOSS: 3 mL Fluid deficit: 0 mL  Intake/Output Summary (Last 24 hours) at 01/13/2021 0953 Last data filed at 01/13/2021 5993 Gross per 24 hour  Intake 500 ml  Output 153 ml  Net 347 ml     BLOOD ADMINISTERED:none   LOCAL MEDICATIONS USED:  XYLOCAINE 1% 10 cc for Paracervical Block  SPECIMEN:  Source of Specimen:  Excision specimen of submucosal fibroid and endometrial curettings  DISPOSITION OF SPECIMEN:  PATHOLOGY  COUNTS:  YES  PLAN OF CARE: Transfer to PACU  Marie-Lyne LavoieMD9:53 AM

## 2021-01-13 NOTE — Anesthesia Postprocedure Evaluation (Signed)
Anesthesia Post Note  Patient: Nina Le  Procedure(s) Performed: [place] (Uterus)     Patient location during evaluation: PACU Anesthesia Type: General Level of consciousness: awake and alert Pain management: pain level controlled Vital Signs Assessment: post-procedure vital signs reviewed and stable Respiratory status: spontaneous breathing, nonlabored ventilation, respiratory function stable and patient connected to nasal cannula oxygen Cardiovascular status: blood pressure returned to baseline and stable Postop Assessment: no apparent nausea or vomiting Anesthetic complications: no   No notable events documented.  Last Vitals:  Vitals:   01/13/21 0925 01/13/21 0945  BP:  (!) 120/54  Pulse: 63 (!) 57  Resp: 13 12  Temp: 36.4 C   SpO2: 100% 100%    Last Pain:  Vitals:   01/13/21 0925  TempSrc: Oral                 [name]

## 2021-01-13 NOTE — Anesthesia Preprocedure Evaluation (Signed)
Anesthesia Evaluation  Patient identified by MRN, date of birth, ID band Patient awake    Reviewed: Allergy & Precautions, NPO status , Patient's Chart, lab work & pertinent test results  Airway Mallampati: II  TM Distance: >3 FB Neck ROM: Full    Dental  (+) Upper Dentures, Lower Dentures   Pulmonary neg pulmonary ROS,    Pulmonary exam normal breath sounds clear to auscultation       Cardiovascular hypertension, Normal cardiovascular exam+ Valvular Problems/Murmurs MR  Rhythm:Regular Rate:Normal     Neuro/Psych negative neurological ROS  negative psych ROS   GI/Hepatic negative GI ROS, Neg liver ROS,   Endo/Other  Morbid obesity  Renal/GU negative Renal ROS  negative genitourinary   Musculoskeletal negative musculoskeletal ROS (+)   Abdominal   Peds negative pediatric ROS (+)  Hematology negative hematology ROS (+)   Anesthesia Other Findings   Reproductive/Obstetrics negative OB ROS                             Anesthesia Physical Anesthesia Plan  ASA: 3  Anesthesia Plan: General   Post-op Pain Management:    Induction: Intravenous  PONV Risk Score and Plan: 3 and Ondansetron, Dexamethasone and Treatment may vary due to age or medical condition  Airway Management Planned:   Additional Equipment:   Intra-op Plan:   Post-operative Plan: Extubation in OR  Informed Consent: I have reviewed the patients History and Physical, chart, labs and discussed the procedure including the risks, benefits and alternatives for the proposed anesthesia with the patient or authorized representative who has indicated his/her understanding and acceptance.     Dental advisory given  Plan Discussed with: CRNA and Surgeon  Anesthesia Plan Comments:         Anesthesia Quick Evaluation

## 2021-01-14 ENCOUNTER — Encounter (HOSPITAL_BASED_OUTPATIENT_CLINIC_OR_DEPARTMENT_OTHER): Payer: Self-pay | Admitting: Obstetrics & Gynecology

## 2021-01-14 LAB — SURGICAL PATHOLOGY

## 2021-01-18 ENCOUNTER — Ambulatory Visit (INDEPENDENT_AMBULATORY_CARE_PROVIDER_SITE_OTHER): Payer: Medicare Other

## 2021-01-18 DIAGNOSIS — Z0000 Encounter for general adult medical examination without abnormal findings: Secondary | ICD-10-CM | POA: Diagnosis not present

## 2021-01-18 NOTE — Progress Notes (Signed)
Subjective:   Nina Le is a 80 y.o. female who presents for Medicare Annual (Subsequent) preventive examination.  Review of Systems     Cardiac Risk Factors include: advanced age (>72men, >65 women);dyslipidemia;hypertension     Objective:    Today's Vitals   There is no height or weight on file to calculate BMI.  Advanced Directives 01/18/2021 01/13/2021 07/22/2015  Does Patient Have a Medical Advance Directive? No No No  Would patient like information on creating a medical advance directive? No - Patient declined No - Patient declined -    Current Medications (verified) Outpatient Encounter Medications as of 01/18/2021  Medication Sig   Acetaminophen (TYLENOL PO) Take by mouth as needed.   amLODipine (NORVASC) 5 MG tablet Take 5 mg by mouth daily.   aspirin EC 81 MG tablet Take 81 mg by mouth daily. Swallow whole.   ibuprofen (ADVIL) 800 MG tablet Take 800 mg by mouth every 8 (eight) hours as needed.   lisinopril-hydrochlorothiazide (ZESTORETIC) 20-12.5 MG tablet Take 1 tablet by mouth daily. (Patient taking differently: Take 1 tablet by mouth daily.)   Multiple Vitamin (MULTIVITAMIN) tablet Take 1 tablet by mouth daily.   VITAMIN E COMPLEX PO Take by mouth every other day.   No facility-administered encounter medications on file as of 01/18/2021.    Allergies (verified) Patient has no known allergies.   History: Past Medical History:  Diagnosis Date   Chronic back pain    History of COVID-19 05/2020   pt's pcp note in epic 05-14-2020 stated pt has covid;  per pt mild symptoms that resolved   Hyperlipidemia    Hypertension    followed by pcp   Mild mitral regurgitation    per echo in epic 03-16-2010, mild LVH, ef 55-65%, G1DD, mild MR with calcified annulus and no stenosis, trivial TR/ PR, mild AV thickened calicified leaflets but no scerlosis, no stenosis, no regurg   OA (osteoarthritis)    Thickened endometrium    Wears dentures    Wears glasses     Past Surgical History:  Procedure Laterality Date   CARDIAC CATHETERIZATION  10/21/2003   @[facility]  by dr Darnell Level brodie;  normal coronary angiography , LVF, and wall motion, ef 65%  (abnormal ekg suggest infart and pre-op eval priot to back surgery)   COLONOSCOPY     last one approx 2017   [place] N/A 01/13/2021   Procedure: [place];  Surgeon: Princess Bruins, MD;  Location: [facility];  Service: Gynecology;  Laterality: N/A;   KNEE ARTHROSCOPY W/ MENISCAL REPAIR Right 02/16/2004   @[facility]    LUMBAR DISC SURGERY  11/12/2003   @[facility]  by Dr. Lorin Mercy;  L5--S1   TONSILLECTOMY     child   Family History  Problem Relation Age of Onset   Arthritis Mother    Hypertension Mother    Breast cancer Mother        cancer in the blood   Heart disease Father    Breast cancer Sister    Colon cancer Neg Hx    Social History   Socioeconomic History   Marital status: Widowed    Spouse name: Not on file   Number of children: Not on file   Years of education: Not on file   Highest education level: Not on file  Occupational History   Not on file  Tobacco Use   Smoking status: Never   Smokeless tobacco: Never  Vaping Use  Vaping Use: Never used  Substance and Sexual Activity   Alcohol use: No   Drug use: Never   Sexual activity: Not on file  Other Topics Concern   Not on file  Social History Narrative   Residence [place] but in town frequently   hh of 1 daughter lives in [place]   No pets   Sleep 7 hours   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         Social Determinants of Radio broadcast assistant Strain: Low Risk    Difficulty of Paying Living Expenses: Not hard at all  Food Insecurity: No Food Insecurity   Worried About Charity fundraiser in the Last Year: Never true   Arboriculturist in the Last Year: Never true  Transportation Needs: No Transportation Needs   Lack of  Transportation (Medical): No   Lack of Transportation (Non-Medical): No  Physical Activity: Insufficiently Active   Days of Exercise per Week: 7 days   Minutes of Exercise per Session: 10 min  Stress: No Stress Concern Present   Feeling of Stress : Not at all  Social Connections: Moderately Integrated   Frequency of Communication with Friends and Family: Three times a week   Frequency of Social Gatherings with Friends and Family: Three times a week   Attends Religious Services: More than 4 times per year   Active Member of Clubs or Organizations: Yes   Attends Archivist Meetings: More than 4 times per year   Marital Status: Widowed    Tobacco Counseling Counseling given: Not Answered   Clinical Intake:  Pre-visit preparation completed: Yes  Pain : No/denies pain     Nutritional Risks: None Diabetes: No  How often do you need to have someone help you when you read instructions, pamphlets, or other written materials from your doctor or pharmacy?: 1 - Never What is the last grade level you completed in school?: 11th grade  Diabetic?no  Interpreter Needed?: No  Information entered by :: L>Leanza Shepperson,Lpn   Activities of Daily Living In your present state of health, do you have any difficulty performing the following activities: 01/18/2021 01/13/2021  Hearing? N N  Vision? N N  Difficulty concentrating or making decisions? N N  Walking or climbing stairs? N Y  Comment - due to arthritis  Dressing or bathing? N N  Doing errands, shopping? N -  Preparing Food and eating ? N -  Using the Toilet? N -  In the past six months, have you accidently leaked urine? N -  Do you have problems with loss of bowel control? N -  Managing your Medications? N -  Managing your Finances? N -  Housekeeping or managing your Housekeeping? N -  Some recent data might be hidden    Patient Care Team: Panosh, Standley Brooking, MD as PCP - General Marybelle Killings, MD (Orthopedic  Surgery)  Indicate any recent Medical Services you may have received from other than [facility] providers in the past year (date may be approximate).     Assessment:   This is a routine wellness examination for Nina.  Hearing/Vision screen Vision Screening - Comments:: Annual eye exams wears glasses   Dietary issues and exercise activities discussed: Current Exercise Habits: Home exercise routine, Type of exercise: walking, Time (Minutes): 10, Frequency (Times/Week): 7, Weekly Exercise (Minutes/Week): 70, Exercise limited by: orthopedic condition(s)   Goals Addressed   None    Depression Screen PHQ 2/9 Scores  01/18/2021 01/18/2021 01/15/2020 05/20/2019 05/18/2018 05/17/2017 05/16/2016  PHQ - 2 Score 0 0 0 0 0 0 0  PHQ- 9 Score - - 0 - - - -    Fall Risk Fall Risk  01/18/2021 01/15/2020 03/11/2019 05/18/2018 05/17/2017  Falls in the past year? 0 0 0 1 No  Number falls in past yr: 0 - 0 0 -  Injury with Fall? 0 - 0 0 -  Follow up Falls evaluation completed - Falls evaluation completed - -  Comment uses cane - - - -    FALL RISK PREVENTION PERTAINING TO THE HOME:  Any stairs in or around the home? No  If so, are there any without handrails? No  Home free of loose throw rugs in walkways, pet beds, electrical cords, etc? Yes  Adequate lighting in your home to reduce risk of falls? Yes   ASSISTIVE DEVICES UTILIZED TO PREVENT FALLS:  Life alert? No  Use of a cane, walker or w/c? Yes  Grab bars in the bathroom? Yes  Shower chair or bench in shower? No  Elevated toilet seat or a handicapped toilet? Yes    Cognitive Function:  Normal cognitive status assessed by direct observation by this Nurse Health Advisor. No abnormalities found.        Immunizations Immunization History  Administered Date(s) Administered   Fluad Quad(high Dose 65+) 03/11/2019, 01/15/2020   Influenza Whole 01/02/2009, 01/04/2010   Influenza, High Dose Seasonal PF 05/15/2015, 02/15/2016, 02/16/2017, 02/27/2018    Influenza,inj,Quad PF,6+ Mos 01/09/2013, 03/27/2014   Moderna Sars-Covid-2 Vaccination 05/22/2019, 06/19/2019, 02/05/2020   Pneumococcal Conjugate-13 05/15/2015   Pneumococcal Polysaccharide-23 05/16/2016   Td 04/11/2004    TDAP status: Due, Education has been provided regarding the importance of this vaccine. Advised may receive this vaccine at local pharmacy or Health Dept. Aware to provide a copy of the vaccination record if obtained from local pharmacy or Health Dept. Verbalized acceptance and understanding.  Flu Vaccine status: Up to date  Pneumococcal vaccine status: Up to date  Covid-19 vaccine status: Completed vaccines  Qualifies for Shingles Vaccine? Yes   Zostavax completed No   Shingrix Completed?: No.    Education has been provided regarding the importance of this vaccine. Patient has been advised to call insurance company to determine out of pocket expense if they have not yet received this vaccine. Advised may also receive vaccine at local pharmacy or Health Dept. Verbalized acceptance and understanding.  Screening Tests Health Maintenance  Topic Date Due   Zoster Vaccines- Shingrix (1 of 2) Never done   TETANUS/TDAP  04/11/2014   COVID-19 Vaccine (4 - Booster for Moderna series) 04/29/2020   INFLUENZA VACCINE  11/09/2020   DEXA SCAN  Completed   Hepatitis C Screening  Completed   HPV VACCINES  Aged Out    Health Maintenance  Health Maintenance Due  Topic Date Due   Zoster Vaccines- Shingrix (1 of 2) Never done   TETANUS/TDAP  04/11/2014   COVID-19 Vaccine (4 - Booster for Moderna series) 04/29/2020   INFLUENZA VACCINE  11/09/2020    Colorectal cancer screening: No longer required.   Mammogram status: No longer required due to age.  Bone Density status: Completed 04/16/2019. Results reflect: Bone density results: OSTEOPENIA. Repeat every 5 years.  Lung Cancer Screening: (Low Dose CT Chest recommended if Age 21-80 years, 30 pack-year currently smoking OR  have quit w/in 15years.) does not qualify.   Lung Cancer Screening Referral: n/a  Additional Screening:  Hepatitis C Screening: does  not qualify; Completed 06/10/2020  Vision Screening: Recommended annual ophthalmology exams for early detection of glaucoma and other disorders of the eye. Is the patient up to date with their annual eye exam?  Yes  Who is the provider or what is the name of the office in which the patient attends annual eye exams? [facility]  If pt is not established with a provider, would they like to be referred to a provider to establish care? No .   Dental Screening: Recommended annual dental exams for proper oral hygiene  Community Resource Referral / Chronic Care Management: CRR required this visit?  No   CCM required this visit?  No      Plan:     I have personally reviewed and noted the following in the patient's chart:   Medical and social history Use of alcohol, tobacco or illicit drugs  Current medications and supplements including opioid prescriptions.  Functional ability and status Nutritional status Physical activity Advanced directives List of other physicians Hospitalizations, surgeries, and ER visits in previous 12 months Vitals Screenings to include cognitive, depression, and falls Referrals and appointments  In addition, I have reviewed and discussed with patient certain preventive protocols, quality metrics, and best practice recommendations. A written personalized care plan for preventive services as well as general preventive health recommendations were provided to patient.     Randel Pigg, LPN   [id number]   Nurse Notes: none        Established Patient Office Visit  Subjective:  Patient ID: Nina Le, female    DOB: 07-20-1940  Age: 80 y.o. MRN: [id number]  CC:  Chief Complaint  Patient presents with   Medicare Wellness    HPI Nina Le presents for [facility]  Past Medical History:  Diagnosis  Date   Chronic back pain    History of COVID-19 05/2020   pt's pcp note in epic 05-14-2020 stated pt has covid;  per pt mild symptoms that resolved   Hyperlipidemia    Hypertension    followed by pcp   Mild mitral regurgitation    per echo in epic 03-16-2010, mild LVH, ef 55-65%, G1DD, mild MR with calcified annulus and no stenosis, trivial TR/ PR, mild AV thickened calicified leaflets but no scerlosis, no stenosis, no regurg   OA (osteoarthritis)    Thickened endometrium    Wears dentures    Wears glasses     Past Surgical History:  Procedure Laterality Date   CARDIAC CATHETERIZATION  10/21/2003   @[facility]  by dr Darnell Level brodie;  normal coronary angiography , LVF, and wall motion, ef 65%  (abnormal ekg suggest infart and pre-op eval priot to back surgery)   COLONOSCOPY     last one approx 2017   [place] N/A 01/13/2021   Procedure: [place];  Surgeon: Princess Bruins, MD;  Location: [facility];  Service: Gynecology;  Laterality: N/A;   KNEE ARTHROSCOPY W/ MENISCAL REPAIR Right 02/16/2004   @[facility]    LUMBAR DISC SURGERY  11/12/2003   @[facility]  by Dr. Lorin Mercy;  L5--S1   TONSILLECTOMY     child    Family History  Problem Relation Age of Onset   Arthritis Mother    Hypertension Mother    Breast cancer Mother        cancer in the blood   Heart disease Father    Breast cancer Sister    Colon cancer Neg Hx  Social History   Socioeconomic History   Marital status: Widowed    Spouse name: Not on file   Number of children: Not on file   Years of education: Not on file   Highest education level: Not on file  Occupational History   Not on file  Tobacco Use   Smoking status: Never   Smokeless tobacco: Never  Vaping Use   Vaping Use: Never used  Substance and Sexual Activity   Alcohol use: No   Drug use: Never   Sexual activity: Not on file  Other Topics Concern   Not on file  Social  History Narrative   Residence [place] but in town frequently   hh of 1 daughter lives in [place]   No pets   Sleep 7 hours   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         Social Determinants of Radio broadcast assistant Strain: Low Risk    Difficulty of Paying Living Expenses: Not hard at all  Food Insecurity: No Food Insecurity   Worried About Charity fundraiser in the Last Year: Never true   Arboriculturist in the Last Year: Never true  Transportation Needs: No Transportation Needs   Lack of Transportation (Medical): No   Lack of Transportation (Non-Medical): No  Physical Activity: Insufficiently Active   Days of Exercise per Week: 7 days   Minutes of Exercise per Session: 10 min  Stress: No Stress Concern Present   Feeling of Stress : Not at all  Social Connections: Moderately Integrated   Frequency of Communication with Friends and Family: Three times a week   Frequency of Social Gatherings with Friends and Family: Three times a week   Attends Religious Services: More than 4 times per year   Active Member of Clubs or Organizations: Yes   Attends Archivist Meetings: More than 4 times per year   Marital Status: Widowed  Human resources officer Violence: Not At Risk   Fear of Current or Ex-Partner: No   Emotionally Abused: No   Physically Abused: No   Sexually Abused: No    Outpatient Medications Prior to Visit  Medication Sig Dispense Refill   Acetaminophen (TYLENOL PO) Take by mouth as needed.     amLODipine (NORVASC) 5 MG tablet Take 5 mg by mouth daily.     aspirin EC 81 MG tablet Take 81 mg by mouth daily. Swallow whole.     ibuprofen (ADVIL) 800 MG tablet Take 800 mg by mouth every 8 (eight) hours as needed.     lisinopril-hydrochlorothiazide (ZESTORETIC) 20-12.5 MG tablet Take 1 tablet by mouth daily. (Patient taking differently: Take 1 tablet by mouth daily.) 90 tablet 3   Multiple Vitamin (MULTIVITAMIN) tablet Take 1 tablet by mouth daily.      VITAMIN E COMPLEX PO Take by mouth every other day.     No facility-administered medications prior to visit.    No Known Allergies  ROS Review of Systems    Objective:    Physical Exam  There were no vitals taken for this visit. Wt Readings from Last 3 Encounters:  01/13/21 246 lb 8 oz (111.8 kg)  01/04/21 247 lb 9.6 oz (112.3 kg)  06/10/20 242 lb 3.2 oz (109.9 kg)     Health Maintenance Due  Topic Date Due   Zoster Vaccines- Shingrix (1 of 2) Never done   TETANUS/TDAP  04/11/2014   COVID-19 Vaccine (4 - Booster for Moderna series)  04/29/2020   INFLUENZA VACCINE  11/09/2020    There are no preventive care reminders to display for this patient.  Lab Results  Component Value Date   TSH 1.33 05/16/2016   Lab Results  Component Value Date   WBC 7.9 01/11/2021   HGB 12.9 01/11/2021   HCT 40.2 01/11/2021   MCV 87.0 01/11/2021   PLT 256 01/11/2021   Lab Results  Component Value Date   NA 143 01/11/2021   K 4.1 01/11/2021   CO2 27 01/11/2021   GLUCOSE 100 (H) 01/11/2021   BUN 24 (H) 01/11/2021   CREATININE 0.81 01/11/2021   BILITOT 0.9 07/30/2020   ALKPHOS 88 07/30/2020   AST 26 07/30/2020   ALT 15 07/30/2020   PROT 7.3 07/30/2020   ALBUMIN 3.5 07/30/2020   CALCIUM 9.9 01/11/2021   ANIONGAP 9 01/11/2021   GFR 81.78 06/10/2020   Lab Results  Component Value Date   CHOL 209 (H) 06/10/2020   Lab Results  Component Value Date   HDL 53.50 06/10/2020   Lab Results  Component Value Date   LDLCALC 141 (H) 06/10/2020   Lab Results  Component Value Date   TRIG 72.0 06/10/2020   Lab Results  Component Value Date   CHOLHDL 4 06/10/2020   Lab Results  Component Value Date   HGBA1C 5.8 06/10/2020      Assessment & Plan:   Problem List Items Addressed This Visit   None Visit Diagnoses     Encounter for Medicare annual wellness exam    -  Primary       No orders of the defined types were placed in this encounter.   Follow-up: Return in 1  year (on 01/18/2022).    Randel Pigg, LPN

## 2021-01-18 NOTE — Patient Instructions (Signed)
Nina Le , Thank you for taking time to come for your Medicare Wellness Visit. I appreciate your ongoing commitment to your health goals. Please review the following plan we discussed and let me know if I can assist you in the future.   Screening recommendations/referrals: Colonoscopy: no longer required  Mammogram: no longer required  Bone Density: 04/16/2019 Recommended yearly ophthalmology/optometry visit for glaucoma screening and checkup Recommended yearly dental visit for hygiene and checkup  Vaccinations: Influenza vaccine: due in fall 2022  Pneumococcal vaccine: completed series  Tdap vaccine: due with injury  Shingles vaccine: declined     Advanced directives: none   Conditions/risks identified: none   Next appointment: none    Preventive Care 78 Years and Older, Female Preventive care refers to lifestyle choices and visits with your health care provider that can promote health and wellness. What does preventive care include? A yearly physical exam. This is also called an annual well check. Dental exams once or twice a year. Routine eye exams. Ask your health care provider how often you should have your eyes checked. Personal lifestyle choices, including: Daily care of your teeth and gums. Regular physical activity. Eating a healthy diet. Avoiding tobacco and drug use. Limiting alcohol use. Practicing safe sex. Taking low-dose aspirin every day. Taking vitamin and mineral supplements as recommended by your health care provider. What happens during an annual well check? The services and screenings done by your health care provider during your annual well check will depend on your age, overall health, lifestyle risk factors, and family history of disease. Counseling  Your health care provider may ask you questions about your: Alcohol use. Tobacco use. Drug use. Emotional well-being. Home and relationship well-being. Sexual activity. Eating habits. History of  falls. Memory and ability to understand (cognition). Work and work Statistician. Reproductive health. Screening  You may have the following tests or measurements: Height, weight, and BMI. Blood pressure. Lipid and cholesterol levels. These may be checked every 5 years, or more frequently if you are over 53 years old. Skin check. Lung cancer screening. You may have this screening every year starting at age 13 if you have a 30-pack-year history of smoking and currently smoke or have quit within the past 15 years. Fecal occult blood test (FOBT) of the stool. You may have this test every year starting at age 28. Flexible sigmoidoscopy or colonoscopy. You may have a sigmoidoscopy every 5 years or a colonoscopy every 10 years starting at age 60. Hepatitis C blood test. Hepatitis B blood test. Sexually transmitted disease (STD) testing. Diabetes screening. This is done by checking your blood sugar (glucose) after you have not eaten for a while (fasting). You may have this done every 1-3 years. Bone density scan. This is done to screen for osteoporosis. You may have this done starting at age 72. Mammogram. This may be done every 1-2 years. Talk to your health care provider about how often you should have regular mammograms. Talk with your health care provider about your test results, treatment options, and if necessary, the need for more tests. Vaccines  Your health care provider may recommend certain vaccines, such as: Influenza vaccine. This is recommended every year. Tetanus, diphtheria, and acellular pertussis (Tdap, Td) vaccine. You may need a Td booster every 10 years. Zoster vaccine. You may need this after age 25. Pneumococcal 13-valent conjugate (PCV13) vaccine. One dose is recommended after age 81. Pneumococcal polysaccharide (PPSV23) vaccine. One dose is recommended after age 68. Talk to your health  care provider about which screenings and vaccines you need and how often you need  them. This information is not intended to replace advice given to you by your health care provider. Make sure you discuss any questions you have with your health care provider. Document Released: 04/24/2015 Document Revised: 12/16/2015 Document Reviewed: 01/27/2015 Elsevier Interactive Patient Education  2017 [employer] Prevention in the Home Falls can cause injuries. They can happen to people of all ages. There are many things you can do to make your home safe and to help prevent falls. What can I do on the outside of my home? Regularly fix the edges of walkways and driveways and fix any cracks. Remove anything that might make you trip as you walk through a door, such as a raised step or threshold. Trim any bushes or trees on the path to your home. Use bright outdoor lighting. Clear any walking paths of anything that might make someone trip, such as rocks or tools. Regularly check to see if handrails are loose or broken. Make sure that both sides of any steps have handrails. Any raised decks and porches should have guardrails on the edges. Have any leaves, snow, or ice cleared regularly. Use sand or salt on walking paths during winter. Clean up any spills in your garage right away. This includes oil or grease spills. What can I do in the bathroom? Use night lights. Install grab bars by the toilet and in the tub and shower. Do not use towel bars as grab bars. Use non-skid mats or decals in the tub or shower. If you need to sit down in the shower, use a plastic, non-slip stool. Keep the floor dry. Clean up any water that spills on the floor as soon as it happens. Remove soap buildup in the tub or shower regularly. Attach bath mats securely with double-sided non-slip rug tape. Do not have throw rugs and other things on the floor that can make you trip. What can I do in the bedroom? Use night lights. Make sure that you have a light by your bed that is easy to reach. Do not use  any sheets or blankets that are too big for your bed. They should not hang down onto the floor. Have a firm chair that has side arms. You can use this for support while you get dressed. Do not have throw rugs and other things on the floor that can make you trip. What can I do in the kitchen? Clean up any spills right away. Avoid walking on wet floors. Keep items that you use a lot in easy-to-reach places. If you need to reach something above you, use a strong step stool that has a grab bar. Keep electrical cords out of the way. Do not use floor polish or wax that makes floors slippery. If you must use wax, use non-skid floor wax. Do not have throw rugs and other things on the floor that can make you trip. What can I do with my stairs? Do not leave any items on the stairs. Make sure that there are handrails on both sides of the stairs and use them. Fix handrails that are broken or loose. Make sure that handrails are as long as the stairways. Check any carpeting to make sure that it is firmly attached to the stairs. Fix any carpet that is loose or worn. Avoid having throw rugs at the top or bottom of the stairs. If you do have throw rugs, attach them to  the floor with carpet tape. Make sure that you have a light switch at the top of the stairs and the bottom of the stairs. If you do not have them, ask someone to add them for you. What else can I do to help prevent falls? Wear shoes that: Do not have high heels. Have rubber bottoms. Are comfortable and fit you well. Are closed at the toe. Do not wear sandals. If you use a stepladder: Make sure that it is fully opened. Do not climb a closed stepladder. Make sure that both sides of the stepladder are locked into place. Ask someone to hold it for you, if possible. Clearly mark and make sure that you can see: Any grab bars or handrails. First and last steps. Where the edge of each step is. Use tools that help you move around (mobility aids)  if they are needed. These include: Canes. Walkers. Scooters. Crutches. Turn on the lights when you go into a dark area. Replace any light bulbs as soon as they burn out. Set up your furniture so you have a clear path. Avoid moving your furniture around. If any of your floors are uneven, fix them. If there are any pets around you, be aware of where they are. Review your medicines with your doctor. Some medicines can make you feel dizzy. This can increase your chance of falling. Ask your doctor what other things that you can do to help prevent falls. This information is not intended to replace advice given to you by your health care provider. Make sure you discuss any questions you have with your health care provider. Document Released: 01/22/2009 Document Revised: 09/03/2015 Document Reviewed: 05/02/2014 Elsevier Interactive Patient Education  2017 [employer].

## 2021-01-19 ENCOUNTER — Telehealth: Payer: Self-pay

## 2021-01-19 NOTE — Telephone Encounter (Signed)
I called and spoke with patient and verified no fever. She even checked her temp last night when cramps were so bad.  She said since we talked even earlier this morning she is feeling better. I did let her know if she felt like she needed it it was fine to take the Ibuprofen for pelvic cramping. We will see her tomorrow for her post op visit. Will call if needs [place] prior to that visit.

## 2021-01-19 NOTE — Telephone Encounter (Signed)
Nina Bruins, MD  You 2 minutes ago (10:35 AM)   Sounds like she is getting better, but for sure recommend Ibuprofen for her pelvic cramps. Vaginal bleeding wnl.  Reassure patient.  Make sure she doesn't have fever.

## 2021-01-19 NOTE — Telephone Encounter (Signed)
Patient had D&C Hyst 01/13/21.  She said each day after she walks to the mailbox she has bleeding. Yesterday it was a little more than usual. Last night she started having really bad cramping about 9pm. It woke her at 3 am and she was up taking more Tylenol and used a heating pad. She is not bleeding today but still having the cramps although they have eased some from last night. She said it was "really painful" last night.  She called to ask if she could take more than 2 Tylenol since not relieving the pain. I told her we do not recommend taking more that the recommended dose.  She has 800 mg Ibuprofen on hand and asked if she should take that but was concerned it might make her bleed more.  She has post op appt scheduled tomorrow at 3:15pm.

## 2021-01-20 ENCOUNTER — Encounter: Payer: Self-pay | Admitting: Obstetrics & Gynecology

## 2021-01-20 ENCOUNTER — Ambulatory Visit (INDEPENDENT_AMBULATORY_CARE_PROVIDER_SITE_OTHER): Payer: Medicare Other | Admitting: Obstetrics & Gynecology

## 2021-01-20 VITALS — BP 128/74 | HR 80 | Resp 18

## 2021-01-20 DIAGNOSIS — Z09 Encounter for follow-up examination after completed treatment for conditions other than malignant neoplasm: Secondary | ICD-10-CM

## 2021-01-20 NOTE — Progress Notes (Signed)
    Nina Le 08/04/1940 [id number]        80 y.o.  G3P3L3  RP: Postop Hysteroscopy D&C Myosure on 01/13/21  HPI: Good postop evolution.  Mild brownish vaginal spotting.  No vaginal discharge.  No pelvic pain.  No fever.   OB History  Gravida Para Term Preterm AB Living  3         3  SAB IAB Ectopic Multiple Live Births               # Outcome Date GA Lbr Len/2nd Weight Sex Delivery Anes PTL Lv  3 Gravida           2 Gravida           1 Gravida             Past medical history,surgical history, problem list, medications, allergies, family history and social history were all reviewed and documented in the EPIC chart.   Directed ROS with pertinent positives and negatives documented in the history of present illness/assessment and plan.  Exam:  Vitals:   01/20/21 1508  BP: 128/74  Pulse: 80  Resp: 18   General appearance:  Normal  Abdomen: Normal  Gynecologic exam: Vulva normal.  Bimanual exam:  Uterus AV, Normal volume, mobile, NT.  No adnexal mass, NT.  Patho:  FINAL MICROSCOPIC DIAGNOSIS:   A. ENDOMETRIAL CURETTAGE AND EXCISION OF SUBMUCOSAL FIBROID:  - Benign endometrial polyp  - Negative for hyperplasia or malignancy    Assessment/Plan:  80 y.o. G3P0   1. Status post gynecological surgery, follow-up exam  Good post op healing.  No CI.  Patho benign.  Patient reassured.  Princess Bruins MD, 3:17 PM 01/20/2021

## 2021-03-03 ENCOUNTER — Other Ambulatory Visit: Payer: Self-pay | Admitting: Internal Medicine

## 2021-03-03 DIAGNOSIS — Z1231 Encounter for screening mammogram for malignant neoplasm of breast: Secondary | ICD-10-CM

## 2021-03-15 NOTE — Progress Notes (Signed)
ACUTE VISIT Chief Complaint  Patient presents with   Dizziness    X last 4 days. It started on Friday. Patient denies any nausea/vomiting. Worse when laying down, bed starts to spin. Had to lay back down this morning.   HPI: Nina Le is a 80 y.o. female, who is here today complaining of 4 days of dizziness as described above.  Spinning sensation: yes Duration: A few seconds at the time. Frequency: daily. Prior episodes: No prior Hx. Exacerbated by laying down and getting up fast. Alleviated by being still for a few seconds.  Negative for associated MS changes, visual changes, chest pain,dyspnea, palpitation, or syncope. She had mild headache yesterday morning,frontal pressure.  She has had rhinorrhea and nasal congestion.No known hx of allergies. She has not noted sore throat. No sick contact.  No hearing loss, tinnitus,recent URI or travel. She has npt tried OTC medications. Problem seems to be getting worse.  Review of Systems  Constitutional:  Negative for activity change, appetite change, chills, fatigue and fever.  Respiratory:  Negative for cough and wheezing.   Gastrointestinal:  Negative for abdominal pain, nausea and vomiting.       No changes in bowel habits.  Endocrine: Negative for cold intolerance and heat intolerance.  Genitourinary:  Negative for decreased urine volume, dysuria and hematuria.  Skin:  Negative for pallor and rash.  Neurological:  Negative for facial asymmetry and weakness.  Rest see pertinent positives and negatives per HPI.  Current Outpatient Medications on File Prior to Visit  Medication Sig Dispense Refill   Acetaminophen (TYLENOL PO) Take by mouth as needed.     amLODipine (NORVASC) 5 MG tablet Take 5 mg by mouth daily.     aspirin EC 81 MG tablet Take 81 mg by mouth daily. Swallow whole.     ibuprofen (ADVIL) 800 MG tablet Take 800 mg by mouth every 8 (eight) hours as needed.     lisinopril-hydrochlorothiazide  (ZESTORETIC) 20-12.5 MG tablet Take 1 tablet by mouth daily. (Patient taking differently: Take 1 tablet by mouth daily.) 90 tablet 3   Multiple Vitamin (MULTIVITAMIN) tablet Take 1 tablet by mouth daily.     VITAMIN E COMPLEX PO Take by mouth every other day.     No current facility-administered medications on file prior to visit.   Past Medical History:  Diagnosis Date   Chronic back pain    History of COVID-19 05/2020   pt's pcp note in epic 05-14-2020 stated pt has covid;  per pt mild symptoms that resolved   Hyperlipidemia    Hypertension    followed by pcp   Mild mitral regurgitation    per echo in epic 03-16-2010, mild LVH, ef 55-65%, G1DD, mild MR with calcified annulus and no stenosis, trivial TR/ PR, mild AV thickened calicified leaflets but no scerlosis, no stenosis, no regurg   OA (osteoarthritis)    Thickened endometrium    Wears dentures    Wears glasses    Drug Allergies: No Known Allergies  Social History   Socioeconomic History   Marital status: Widowed    Spouse name: Not on file   Number of children: Not on file   Years of education: Not on file   Highest education level: Not on file  Occupational History   Not on file  Tobacco Use   Smoking status: Never   Smokeless tobacco: Never  Vaping Use   Vaping Use: Never used  Substance and Sexual Activity   Alcohol use:  No   Drug use: Never   Sexual activity: Not on file  Other Topics Concern   Not on file  [place] but in town frequently   hh of 1 daughter lives in [place]   No pets   Sleep 7 hours   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         Social Determinants of Radio broadcast assistant Strain: Low Risk    Difficulty of Paying Living Expenses: Not hard at [employer] Insecurity: No Food Insecurity   Worried About Charity fundraiser in the Last Year: Never true   Arboriculturist in the Last Year: Never true  Transportation Needs: No  Data processing manager (Medical): No   Lack of Transportation (Non-Medical): No  Physical Activity: Insufficiently Active   Days of Exercise per Week: 7 days   Minutes of Exercise per Session: 10 min  Stress: No Stress Concern Present   Feeling of Stress : Not at all  Social Connections: Moderately Integrated   Frequency of Communication with Friends and Family: Three times a week   Frequency of Social Gatherings with Friends and Family: Three times a week   Attends Religious Services: More than 4 times per year   Active Member of Clubs or Organizations: Yes   Attends Archivist Meetings: More than 4 times per year   Marital Status: Widowed    Vitals:   03/16/21 0958  BP: 120/70  Pulse: 65  Resp: 16  Temp: 98.1 F (36.7 C)  SpO2: 99%   Body mass index is 42.25 kg/m.  Physical Exam Vitals and nursing note reviewed.  Constitutional:      General: She is not in acute distress.    Appearance: She is well-developed. She is not ill-appearing.  HENT:     Head: Normocephalic and atraumatic.     Right Ear: Tympanic membrane, ear canal and external ear normal.     Left Ear: Tympanic membrane, ear canal and external ear normal.     Ears:     Comments: She could not get on examination table. Turning head to left side with mild neck extension triggers spinning sensation. Associated mild nystagmus.     Nose: Rhinorrhea present.     Right Turbinates: Enlarged.     Left Turbinates: Enlarged.  Eyes:     Conjunctiva/sclera: Conjunctivae normal.     Pupils: Pupils are equal, round, and reactive to light.  Cardiovascular:     Rate and Rhythm: Normal rate and regular rhythm.     Heart sounds: No murmur heard. Pulmonary:     Effort: Pulmonary effort is normal. No respiratory distress.     Breath sounds: Normal breath sounds.  Abdominal:     Palpations: Abdomen is soft.     Tenderness: There is no abdominal tenderness.  Lymphadenopathy:      Cervical: No cervical adenopathy.  Skin:    General: Skin is warm.     Findings: No erythema or rash.  Neurological:     Mental Status: She is alert and oriented to person, place, and time.     Cranial Nerves: No cranial nerve deficit.     Motor: No tremor or pronator drift.     Comments: Otherwise stable gait, not assisted.  Psychiatric:        Speech: Speech normal.     Comments: Well groomed, good eye contact.  ASSESSMENT AND PLAN:  Nina Le was seen today for dizziness.  Diagnoses and all orders for this visit:  Dizziness We discussed other possible etiologies of dizziness, Hx and examination today suggest benign vertigo. I do not think further work-up is necessary at this time but needs to be consider if worsening or persient symptoms for more that 2 weeks. Explained that problem can be recurrent. Fall prevention. Vestibular exercises recommended, handout with Semont maneuvers given. Meclizine 25 mg bid x 2-3 days then at bedtime for 5 days and prn, some side effects discussed. Instructed about warning signs.  -     meclizine (ANTIVERT) 25 MG tablet; Take 1 tablet (25 mg total) by mouth 2 (two) times daily as needed for dizziness.  Rhinitis, unspecified type ? Allergic. Recommend trying Flonase nasal spray daily x 1-2 weeks then prn. Nasal saline irrigations as needed. Monitor for new symptoms.  -     fluticasone (FLONASE) 50 MCG/ACT nasal spray; Place 1 spray into both nostrils 2 (two) times daily.  Return in about 2 weeks (around 03/30/2021) for dizziness with PCP.  Makaylyn Sinyard G. [place], MD  [facility]. [place] office.

## 2021-03-16 ENCOUNTER — Encounter: Payer: Self-pay | Admitting: Family Medicine

## 2021-03-16 ENCOUNTER — Ambulatory Visit (INDEPENDENT_AMBULATORY_CARE_PROVIDER_SITE_OTHER): Payer: Medicare Other | Admitting: Family Medicine

## 2021-03-16 VITALS — BP 120/70 | HR 65 | Temp 98.1°F | Resp 16 | Ht 64.5 in | Wt 250.0 lb

## 2021-03-16 DIAGNOSIS — R42 Dizziness and giddiness: Secondary | ICD-10-CM | POA: Diagnosis not present

## 2021-03-16 DIAGNOSIS — J310 Chronic rhinitis: Secondary | ICD-10-CM | POA: Diagnosis not present

## 2021-03-16 MED ORDER — FLUTICASONE PROPIONATE 50 MCG/ACT NA SUSP: 1.0000 | Freq: Two times a day (BID) | NASAL | 1 refills | Status: DC

## 2021-03-16 MED ORDER — MECLIZINE HCL 25 MG PO TABS: 25.0000 mg | ORAL_TABLET | Freq: Two times a day (BID) | ORAL | 0 refills | Status: DC | PRN

## 2021-03-16 NOTE — Patient Instructions (Addendum)
A few things to remember from today's visit:  Dizziness - Plan: meclizine (ANTIVERT) 25 MG tablet  Rhinitis, unspecified type - Plan: fluticasone (FLONASE) 50 MCG/ACT nasal spray   Dizziness is a perception of movement, it is sometimes difficult to describe and can be  caused by different problems, most benign but others can be life threaten.  Vertigo is the most common cause of dizziness, usually related with inner ear and can be associated with nausea, vomiting, and unbalance sensation. It can be complicated by falls due to lose of balance; so fall precautions are very important.  Most of the time dizziness is benign, usually intermittent, last a few seconds at the time and aggravated by certain positions. It usually resolves in a few weeks without residual effect but it could be recurrent.  Sometimes blood work is ordered to evaluate for other possible causes.I do not think we need it today.  Dizziness can also be caused by certain medications, dehydration, migraines, and strokes.  Medication prescribed for vertigo, Meclizine, causes drowsiness/sleepiness, so frequently I recommended taking it at bedtime. I also recommend what we called vestibular exercise, sometimes can be done at home (Modified Semont maneuvers) other times I refer patients to vestibular rehabilitation.  Seek immediate medical attention if: New severe headache, dobble vision, fever (100 F or more), associated numbness/tingling, focal weakness, persistent vomiting, not able to walk, or sudden worsening symptoms.  Do not use My Chart to request refills or for acute issues that need immediate attention.   Take Meclizine 2 times daily for 2-3 days then at bedtime for 5 days.  Fall precautions. Nasal saline irrigations as needed. Flonase nasal spray once daily for 10-14 days then prn.  Please be sure medication list is accurate. If a new problem present, please set up appointment sooner than planned  today.

## 2021-03-31 ENCOUNTER — Ambulatory Visit (INDEPENDENT_AMBULATORY_CARE_PROVIDER_SITE_OTHER): Payer: Medicare Other | Admitting: Internal Medicine

## 2021-03-31 ENCOUNTER — Encounter: Payer: Self-pay | Admitting: Internal Medicine

## 2021-03-31 VITALS — BP 134/86 | HR 72 | Temp 98.5°F | Ht 64.5 in | Wt 251.4 lb

## 2021-03-31 DIAGNOSIS — R42 Dizziness and giddiness: Secondary | ICD-10-CM | POA: Diagnosis not present

## 2021-03-31 DIAGNOSIS — I10 Essential (primary) hypertension: Secondary | ICD-10-CM

## 2021-03-31 LAB — BASIC METABOLIC PANEL
BUN: 17 mg/dL (ref 6–23)
CO2: 31 mEq/L (ref 19–32)
Calcium: 10 mg/dL (ref 8.4–10.5)
Chloride: 104 mEq/L (ref 96–112)
Creatinine, Ser: 0.74 mg/dL (ref 0.40–1.20)
GFR: 77.38 mL/min (ref >60.00)
Glucose, Bld: 99 mg/dL (ref 70–99)
Potassium: 4.4 mEq/L (ref 3.5–5.1)
Sodium: 141 mEq/L (ref 135–145)

## 2021-03-31 LAB — CBC WITH DIFFERENTIAL/PLATELET
Basophils Absolute: 0 10*3/uL (ref 0.0–0.1)
Basophils Relative: 0.7 % (ref 0.0–3.0)
Eosinophils Absolute: 0 10*3/uL (ref 0.0–0.7)
Eosinophils Relative: 0.7 % (ref 0.0–5.0)
HCT: 40.3 % (ref 36.0–46.0)
Hemoglobin: 13 g/dL (ref 12.0–15.0)
Lymphocytes Relative: 29.7 % (ref 12.0–46.0)
Lymphs Abs: 2.1 10*3/uL (ref 0.7–4.0)
MCHC: 32.2 g/dL (ref 30.0–36.0)
MCV: 86.2 fl (ref 78.0–100.0)
Monocytes Absolute: 0.5 10*3/uL (ref 0.1–1.0)
Monocytes Relative: 7.9 % (ref 3.0–12.0)
Neutro Abs: 4.2 10*3/uL (ref 1.4–7.7)
Neutrophils Relative %: 61 % (ref 43.0–77.0)
Platelets: 243 10*3/uL (ref 150.0–400.0)
RBC: 4.68 Mil/uL (ref 3.87–5.11)
RDW: 14.2 % (ref 11.5–15.5)
WBC: 6.9 10*3/uL (ref 4.0–10.5)

## 2021-03-31 LAB — SEDIMENTATION RATE: Sed Rate: 41 mm/hr — ABNORMAL HIGH (ref 0–30)

## 2021-03-31 LAB — TSH: TSH: 0.87 u[IU]/mL (ref 0.35–5.50)

## 2021-03-31 NOTE — Progress Notes (Signed)
Good news  kidneys potassium blood sugar  and blood count and thyroid are all normal  .   Let us know  if not getting better as we discussed

## 2021-03-31 NOTE — Progress Notes (Signed)
Chief Complaint  Patient presents with   Follow-up    HPI: Nina Le 79 y.o. come in for  fu of dizziness    vertigo type  sx  .  Positional intermittent  sx  . Not worse but still happens   and no new sx . Came initially  on sudden doesn't remember situation.  Saw dr Nina Le 12 6 in my absence and  dx peripheral vertigo? and rhinitis  Worse when lays on right side  .  Then  No vision changes   no double . Vision falling or imbalance  Still have it  when put head back in shower but not all the times   Allergies not coughing as much       ROS: See pertinent positives and negatives per HPI.  Past Medical History:  Diagnosis Date   Chronic back pain    History of COVID-19 05/2020   pt's pcp note in epic 05-14-2020 stated pt has covid;  per pt mild symptoms that resolved   Hyperlipidemia    Hypertension    followed by pcp   Mild mitral regurgitation    per echo in epic 03-16-2010, mild LVH, ef 55-65%, G1DD, mild MR with calcified annulus and no stenosis, trivial TR/ PR, mild AV thickened calicified leaflets but no scerlosis, no stenosis, no regurg   OA (osteoarthritis)    Thickened endometrium    Wears dentures    Wears glasses     Family History  Problem Relation Age of Onset   Arthritis Mother    Hypertension Mother    Breast cancer Mother        cancer in the blood   Heart disease Father    Breast cancer Sister    Colon cancer Neg Hx     Social History   Socioeconomic History   Marital status: Widowed    Spouse name: Not on file   Number of children: Not on file   Years of education: Not on file   Highest education level: Not on file  Occupational History   Not on file  Tobacco Use   Smoking status: Never   Smokeless tobacco: Never  Vaping Use   Vaping Use: Never used  Substance and Sexual Activity   Alcohol use: No   Drug use: Never   Sexual activity: Not on file  Other Topics Concern   Not on file  Social History Narrative   Residence  [place] but in town frequently   hh of 1 daughter lives in [name]   No pets   Sleep 7 hours   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         Social Determinants of Radio broadcast assistant Strain: Low Risk    Difficulty of Paying Living Expenses: Not hard at all  Food Insecurity: No Food Insecurity   Worried About Charity fundraiser in the Last Year: Never true   Arboriculturist in the Last Year: Never true  Transportation Needs: No Transportation Needs   Lack of Transportation (Medical): No   Lack of Transportation (Non-Medical): No  Physical Activity: Insufficiently Active   Days of Exercise per Week: 7 days   Minutes of Exercise per Session: 10 min  Stress: No Stress Concern Present   Feeling of Stress : Not at all  Social Connections: Moderately Integrated   Frequency of Communication with Friends and Family: Three times a week   Frequency of Social Gatherings  with Friends and Family: Three times a week   Attends Religious Services: More than 4 times per year   Active Member of Clubs or Organizations: Yes   Attends Archivist Meetings: More than 4 times per year   Marital Status: Widowed    Outpatient Medications Prior to Visit  Medication Sig Dispense Refill   Acetaminophen (TYLENOL PO) Take by mouth as needed.     amLODipine (NORVASC) 5 MG tablet Take 5 mg by mouth daily.     aspirin EC 81 MG tablet Take 81 mg by mouth daily. Swallow whole.     fluticasone (FLONASE) 50 MCG/ACT nasal spray Place 1 spray into both nostrils 2 (two) times daily. 16 g 1   ibuprofen (ADVIL) 800 MG tablet Take 800 mg by mouth every 8 (eight) hours as needed.     lisinopril-hydrochlorothiazide (ZESTORETIC) 20-12.5 MG tablet Take 1 tablet by mouth daily. (Patient taking differently: Take 1 tablet by mouth daily.) 90 tablet 3   meclizine (ANTIVERT) 25 MG tablet Take 1 tablet (25 mg total) by mouth 2 (two) times daily as needed for dizziness. 30 tablet 0   Multiple Vitamin  (MULTIVITAMIN) tablet Take 1 tablet by mouth daily.     VITAMIN E COMPLEX PO Take by mouth every other day.     No facility-administered medications prior to visit.     EXAM:  BP 134/86 (BP Location: Left Arm, Patient Position: Sitting, Cuff Size: Normal)    Pulse 72    Temp 98.5 F (36.9 C) (Oral)    Ht 5' 4.5" (1.638 m)    Wt 251 lb 6.4 oz (114 kg)    SpO2 98%    BMI 42.49 kg/m   Body mass index is 42.49 kg/m.  GENERAL: vitals reviewed and listed above, alert, oriented, appears well hydrated and in no acute distress HEENT: atraumatic, conjunctiva  clear, no obvious abnormalities on inspection of external nose and ears OP : masked  NECK: no obvious masses on inspection palpation  no bruit heard  LUNGS: clear to auscultation bilaterally, no wheezes, rales or rhonchi, good air movement CV: HRRR, no clubbing cyanosis nl cap refill  MS: moves all extremities without noticeable focal  abnormality Neuro eoms nl gait steady for her and no aggregation sx  PSYCH: pleasant and cooperative, no obvious depression or anxiety Lab Results  Component Value Date   WBC 6.9 03/31/2021   HGB 13.0 03/31/2021   HCT 40.3 03/31/2021   PLT 243.0 03/31/2021   GLUCOSE 99 03/31/2021   CHOL 209 (H) 06/10/2020   TRIG 72.0 06/10/2020   HDL 53.50 06/10/2020   LDLCALC 141 (H) 06/10/2020   ALT 15 07/30/2020   AST 26 07/30/2020   NA 141 03/31/2021   K 4.4 03/31/2021   CL 104 03/31/2021   CREATININE 0.74 03/31/2021   BUN 17 03/31/2021   CO2 31 03/31/2021   TSH 0.87 03/31/2021   HGBA1C 5.8 06/10/2020   BP Readings from Last 3 Encounters:  03/31/21 134/86  03/16/21 120/70  01/20/21 128/74    ASSESSMENT AND PLAN:  Discussed the following assessment and plan:  Dizziness - Plan: Basic metabolic panel, CBC with Differential/Platelet, TSH, Sedimentation rate, Sedimentation rate, TSH, CBC with Differential/Platelet, Basic metabolic panel  Essential hypertension - Plan: Basic metabolic panel, CBC with  Differential/Platelet, TSH, Sedimentation rate, Sedimentation rate, TSH, CBC with Differential/Platelet, Basic metabolic panel Exam reassuring   prob peripheral vertigo but on going and  never had before .  No new meds  r/o metabolic  optimize continue "allergy"  control.  Consider PT  other if on going .  Sx are not  continuous and seem positional .  And mild  -Patient advised to return or notify health care team  if  new concerns arise. Record review and plan  Patient Instructions  Lab today to  rule out metabolic factors with your dizziness.   If all ok  add otc antihistamine  such as Claritin of allegra generic plain ( for allergy sx ) and continue on the  nose spray.    We can do a referral to neuro rehab physical therapy or  other   if on going . After another 2-3 week.     Standley Brooking. Artesha Wemhoff M.D.

## 2021-03-31 NOTE — Patient Instructions (Signed)
Lab today to  rule out metabolic factors with your dizziness.   If all ok  add otc antihistamine  such as Claritin of allegra generic plain ( for allergy sx ) and continue on the  nose spray.    We can do a referral to neuro rehab physical therapy or  other   if on going . After another 2-3 week.

## 2021-04-02 ENCOUNTER — Encounter: Payer: Self-pay | Admitting: Internal Medicine

## 2021-04-09 ENCOUNTER — Other Ambulatory Visit: Payer: Self-pay

## 2021-04-09 ENCOUNTER — Ambulatory Visit
Admission: RE | Admit: 2021-04-09 | Discharge: 2021-04-09 | Disposition: A | Discharge status: Home / Self Care | Payer: Medicare Other | Source: Ambulatory Visit | Attending: Internal Medicine | Admitting: Internal Medicine

## 2021-04-09 DIAGNOSIS — Z1231 Encounter for screening mammogram for malignant neoplasm of breast: Secondary | ICD-10-CM | POA: Diagnosis not present

## 2021-06-14 ENCOUNTER — Other Ambulatory Visit: Payer: Self-pay | Admitting: Internal Medicine

## 2021-06-14 NOTE — Progress Notes (Signed)
Chief Complaint  Patient presents with   Annual Exam    Fasting Need rx for sinus     HPI: Patient  Nina Le  81 y.o. comes in today for Preventive Health Care visit  and med check   "I need  something for sinus"   draining " taking coridin and deslym for cough . Acough  about a week.  ?  Not as bad   but ditn go to church . No fever . Gets allergy cough? Spring pollen  no cp sob fever face pain  taking flonase since dec  ok   Still gets intermittent dizziness no new sz  BP meds just ran out No co  Weight about the same some down. NO change in arthritis  Had lfu vaccine at [facility] ? Jan?    Health Maintenance  Topic Date Due   INFLUENZA VACCINE  06/29/2021 (Originally 11/09/2020)   COVID-19 Vaccine (4 - Booster for Moderna series) 09/29/2021 (Originally 04/01/2020)   Zoster Vaccines- Shingrix (1 of 2) 09/29/2021 (Originally 08/14/1959)   TETANUS/TDAP  12/16/2021 (Originally 04/11/2014)   Pneumonia Vaccine 42+ Years old  Completed   DEXA SCAN  Completed   Hepatitis C Screening  Completed   HPV VACCINES  Aged Out   COLONOSCOPY (Pts 45-13yr Insurance coverage will need to be confirmed)  Discontinued   Health Maintenance Review LIFESTYLE:  Exercise:   sedentary .  Walking to mail box .  Less walking since cold .  Tobacco/ETS: no Alcohol:  n Sugar beverages: sprite ginger ale one a day Sleep: about 6-7  Drug use: no HH of   no pets   ROS:  REST of 12 system review negative except as per HPI no current cp sob falling  change bowel habits    Past Medical History:  Diagnosis Date   Chronic back pain    History of COVID-19 05/2020   pt's pcp note in epic 05-14-2020 stated pt has covid;  per pt mild symptoms that resolved   Hyperlipidemia    Hypertension    followed by pcp   Mild mitral regurgitation    per echo in epic 03-16-2010, mild LVH, ef 55-65%, G1DD, mild MR with calcified annulus and no stenosis, trivial TR/ PR, mild AV thickened calicified leaflets but no  scerlosis, no stenosis, no regurg   OA (osteoarthritis)    Thickened endometrium    Wears dentures    Wears glasses     Past Surgical History:  Procedure Laterality Date   CARDIAC CATHETERIZATION  10/21/2003   '@[facility]'$  by dr bDarnell Levelbrodie;  normal coronary angiography , LVF, and wall motion, ef 65%  (abnormal ekg suggest infart and pre-op eval priot to back surgery)   COLONOSCOPY     last one approx 2017   DChidesterN/A 01/13/2021   Procedure: [place]  Surgeon: LPrincess Bruins MD;  Location: [facility]  Service: Gynecology;  Laterality: N/A;   KNEE ARTHROSCOPY W/ MENISCAL REPAIR Right 02/16/2004   '@[facility]'$    LUMBAR DISC SURGERY  11/12/2003   '@[facility]'$  by Dr. YLorin Mercy  L5--S1   TONSILLECTOMY     child    Family History  Problem Relation Age of Onset   Arthritis Mother    Hypertension Mother    Breast cancer Mother        cancer in the blood   Heart disease Father    Breast cancer Sister    Colon cancer Neg  Hx     Social History   Socioeconomic History   Marital status: Widowed    Spouse name: Not on file   Number of children: Not on file   Years of education: Not on file   Highest education level: Not on file  Occupational History   Not on file  Tobacco Use   Smoking status: Never   Smokeless tobacco: Never  Vaping Use   Vaping Use: Never used  Substance and Sexual Activity   Alcohol use: No   Drug use: Never   Sexual activity: Not on file  Other Topics Concern   Not on file  Social History Narrative   Residence 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         Social Determinants of Radio broadcast assistant Strain: Low Risk    Difficulty of Paying Living Expenses: Not hard at all  Food Insecurity: No Food Insecurity   Worried About Charity fundraiser in the Last  Year: Never true   Arboriculturist in the Last Year: Never true  Transportation Needs: No Transportation Needs   Lack of Transportation (Medical): No   Lack of Transportation (Non-Medical): No  Physical Activity: Insufficiently Active   Days of Exercise per Week: 7 days   Minutes of Exercise per Session: 10 min  Stress: No Stress Concern Present   Feeling of Stress : Not at all  Social Connections: Moderately Integrated   Frequency of Communication with Friends and Family: Three times a week   Frequency of Social Gatherings with Friends and Family: Three times a week   Attends Religious Services: More than 4 times per year   Active Member of Clubs or Organizations: Yes   Attends Archivist Meetings: More than 4 times per year   Marital Status: Widowed    Outpatient Medications Prior to Visit  Medication Sig Dispense Refill   Acetaminophen (TYLENOL PO) Take by mouth as needed.     aspirin EC 81 MG tablet Take 81 mg by mouth daily. Swallow whole.     fluticasone (FLONASE) 50 MCG/ACT nasal spray Place 1 spray into both nostrils 2 (two) times daily. 16 g 1   ibuprofen (ADVIL) 800 MG tablet Take 800 mg by mouth every 8 (eight) hours as needed.     meclizine (ANTIVERT) 25 MG tablet Take 1 tablet (25 mg total) by mouth 2 (two) times daily as needed for dizziness. 30 tablet 0   Multiple Vitamin (MULTIVITAMIN) tablet Take 1 tablet by mouth daily.     VITAMIN E COMPLEX PO Take by mouth every other day.     amLODipine (NORVASC) 5 MG tablet Take 5 mg by mouth daily.     lisinopril-hydrochlorothiazide (ZESTORETIC) 20-12.5 MG tablet Take 1 tablet by mouth once daily 90 tablet 0   No facility-administered medications prior to visit.     EXAM:  BP 132/76 (BP Location: Left Arm, Patient Position: Sitting, Cuff Size: Normal)    Pulse 73    Temp 98.6 F (37 C) (Oral)    Ht 5' 4.5" (1.638 m)    Wt 251 lb 3.2 oz (113.9 kg)    SpO2 98%    BMI 42.45 kg/m   Body mass index is 42.45  kg/m. Wt Readings from Last 3 Encounters:  06/15/21 251 lb 3.2 oz (  113.9 kg)  03/31/21 251 lb 6.4 oz (114 kg)  03/16/21 250 lb (113.4 kg)    Physical Exam: Vital signs reviewed BHA:LPFX is a well-developed well-nourished alert cooperative    who appearsr stated age in no acute distress.  HEENT: normocephalic atraumatic , Eyes: PERRL EOM's full, conjunctiva clear, looks a bit allergi  no cough in visit , Ears: no deformity EAC's clear TMs with normal landmarks. Mouth:masked NECK: supple without masses, thyromegaly or bruits. CHEST/PULM:  Clear to auscultation and percussion breath sounds equal no wheeze , rales or rhonchi.  Breast: normal by inspection . No dimpling, discharge, masses, tenderness or discharge . CV: PMI is nondisplaced, S1 S2 no gallops, murmurs, rubs. Peripheral pulses are present without delay.No JVD .  ABDOMEN: Bowel sounds normal nontender  No guard or rebound, no hepato splenomegal no CVA tenderness. Extremtities:  No clubbing cyanosis or edema, no acute joint swelling or redness no focal atrophy  knee arthritis   no acute swelling NEURO:  Oriented x3, cranial nerves 3-12 appear to be intact, no obvious focal weakness,gait within normal limits  SKIN: No acute rashes normal turgor, color, no bruising or petechiae. PSYCH: Oriented, good eye contact, no obvious depression anxiety, cognition and judgment appear normal. LN: no cervical axillary iadenopathy  Lab Results  Component Value Date   WBC 6.9 03/31/2021   HGB 13.0 03/31/2021   HCT 40.3 03/31/2021   PLT 243.0 03/31/2021   GLUCOSE 99 03/31/2021   CHOL 209 (H) 06/10/2020   TRIG 72.0 06/10/2020   HDL 53.50 06/10/2020   LDLCALC 141 (H) 06/10/2020   ALT 15 07/30/2020   AST 26 07/30/2020   NA 141 03/31/2021   K 4.4 03/31/2021   CL 104 03/31/2021   CREATININE 0.74 03/31/2021   BUN 17 03/31/2021   CO2 31 03/31/2021   TSH 0.87 03/31/2021   HGBA1C 5.8 06/10/2020    BP Readings from Last 3 Encounters:   06/15/21 132/76  03/31/21 134/86  03/16/21 120/70    Lab results reviewed    no need for repeat today  ( last lipid a year ago )   ASSESSMENT AND PLAN:  Discussed the following assessment and plan:    ICD-10-CM   1. Visit for preventive health examination  Z00.00     2. Medication management  Z79.899     3. Essential hypertension  I10     4. Severe obesity (BMI >= 40) (HCC)  E66.01     5. Cough, persistent  R05.3     6. Post-nasal drainage  R09.82     7. Cough, unspecified type  R05.9     8. Dizziness  R42     Upper respiratory symptoms could be related to recent respiratory infection resolving allergy but possibly aggravated by ACE inhibitor as she tends to get recurrent cough sinus problem. Stay on current regimen but switch lisinopril to valsartan range and we will see how she does.  Refilled [employer] to be mailed to her household. See below for instructions.  Stay on her Flonase. Advise healthy weight management as in past. She states she [facility] flu vaccine [facility] let us know if she has more information.  Return for 4-6 mos.  Patient Care Team: Cherryl Babin, Standley Brooking, MD as PCP - General Marybelle Killings, MD (Orthopedic Surgery) Patient Instructions  Good to see you today . Can add claritin zyrtec  and stay on flonase for allergy. Possible cause of sinus and cough.  We  are also   swapping lisinopril hctz ,with valsartan hctz because that may help your cough . Lisinopril aggravates cough in some people  Stay hydrated on these BP medications   if dehydrated may hold the  diuretic med until better .  Advise fu in 4-6 months   make sure bp in range   and   doing better .   Standley Brooking. Nhia Heaphy M.D.

## 2021-06-15 ENCOUNTER — Encounter: Payer: Self-pay | Admitting: Internal Medicine

## 2021-06-15 ENCOUNTER — Ambulatory Visit (INDEPENDENT_AMBULATORY_CARE_PROVIDER_SITE_OTHER): Payer: Medicare Other | Admitting: Internal Medicine

## 2021-06-15 VITALS — BP 132/76 | HR 73 | Temp 98.6°F | Ht 64.5 in | Wt 251.2 lb

## 2021-06-15 DIAGNOSIS — Z0000 Encounter for general adult medical examination without abnormal findings: Secondary | ICD-10-CM | POA: Diagnosis not present

## 2021-06-15 DIAGNOSIS — R42 Dizziness and giddiness: Secondary | ICD-10-CM

## 2021-06-15 DIAGNOSIS — I10 Essential (primary) hypertension: Secondary | ICD-10-CM

## 2021-06-15 DIAGNOSIS — R059 Cough, unspecified: Secondary | ICD-10-CM

## 2021-06-15 DIAGNOSIS — Z79899 Other long term (current) drug therapy: Secondary | ICD-10-CM | POA: Diagnosis not present

## 2021-06-15 DIAGNOSIS — R0982 Postnasal drip: Secondary | ICD-10-CM

## 2021-06-15 DIAGNOSIS — R053 Chronic cough: Secondary | ICD-10-CM | POA: Diagnosis not present

## 2021-06-15 MED ORDER — AMLODIPINE BESYLATE 5 MG PO TABS: 5.0000 mg | ORAL_TABLET | Freq: Every day | ORAL | 3 refills | Status: DC

## 2021-06-15 MED ORDER — VALSARTAN-HYDROCHLOROTHIAZIDE 160-12.5 MG PO TABS: 1.0000 | ORAL_TABLET | Freq: Every day | ORAL | 3 refills | Status: DC

## 2021-06-15 NOTE — Patient Instructions (Addendum)
Good to see you today . ?Can add claritin zyrtec  and stay on flonase for allergy. Possible cause of sinus and cough. ? ?We are also   swapping lisinopril hctz ,with valsartan hctz because that may help your cough . ?Lisinopril aggravates cough in some people ? ?Stay hydrated on these BP medications   if dehydrated may hold the  diuretic med until better . ? ?Advise fu in 4-6 months   make sure bp in range   and   doing better .  ? ? ?

## 2021-06-17 ENCOUNTER — Telehealth: Payer: Self-pay

## 2021-06-17 MED ORDER — AMLODIPINE BESYLATE 5 MG PO TABS: 5.0000 mg | ORAL_TABLET | Freq: Every day | ORAL | 0 refills | Status: DC

## 2021-06-17 MED ORDER — VALSARTAN-HYDROCHLOROTHIAZIDE 160-12.5 MG PO TABS: 1.0000 | ORAL_TABLET | Freq: Every day | ORAL | 0 refills | Status: DC

## 2021-06-17 NOTE — Telephone Encounter (Signed)
Patient called stating she is out of Rx while waiting on mail supply a short supply was sent to local pharmacy ?

## 2021-07-21 DIAGNOSIS — H40003 Preglaucoma, unspecified, bilateral: Secondary | ICD-10-CM | POA: Diagnosis not present

## 2021-07-21 DIAGNOSIS — H2513 Age-related nuclear cataract, bilateral: Secondary | ICD-10-CM | POA: Diagnosis not present

## 2021-12-15 ENCOUNTER — Ambulatory Visit: Payer: Medicare Other | Admitting: Internal Medicine

## 2021-12-31 ENCOUNTER — Telehealth: Payer: Self-pay | Admitting: Internal Medicine

## 2021-12-31 NOTE — Telephone Encounter (Signed)
Left message for patient to call back and schedule Medicare Annual Wellness Visit (AWV) either virtually or in office. Left  my Nina Le number [phone]   Last AWV 01/18/21 ; please schedule at anytime with [facility] Nurse Health Advisor 1 or 2

## 2022-01-25 ENCOUNTER — Ambulatory Visit (INDEPENDENT_AMBULATORY_CARE_PROVIDER_SITE_OTHER): Payer: Medicare Other

## 2022-01-25 VITALS — Ht 64.5 in | Wt 229.0 lb

## 2022-01-25 DIAGNOSIS — Z0000 Encounter for general adult medical examination without abnormal findings: Secondary | ICD-10-CM

## 2022-01-25 NOTE — Progress Notes (Signed)
Subjective:   Nina Le is a 81 y.o. female who presents for Medicare Annual (Subsequent) preventive examination.  Review of Systems    Virtual Visit via Telephone Note  I connected with  Nina Le on 01/25/22 at  1:30 PM EDT by telephone and verified that I am speaking with the correct person using two identifiers.  Location: Patient: Home Provider: Office Persons participating in the virtual visit: patient/Nurse Health Advisor   I discussed the limitations, risks, security and privacy concerns of performing an evaluation and management service by telephone and the availability of in person appointments. The patient expressed understanding and agreed to proceed.  Interactive audio and video telecommunications were attempted between this nurse and patient, however failed, due to patient having technical difficulties OR patient did not have access to video capability.  We continued and completed visit with audio only.  Some vital signs may be absent or patient reported.   Nina Peaches, LPN  Cardiac Risk Factors include: advanced age (>1mn, >>3women);hypertension     Objective:    Today's Vitals   01/25/22 1351  Weight: 229 lb (103.9 kg)  Height: 5' 4.5" (1.638 m)   Body mass index is 38.7 kg/m.     01/25/2022    1:58 PM 01/18/2021    1:17 PM 01/13/2021    6:59 AM 07/22/2015    8:05 AM  Advanced Directives  Does Patient Have a Medical Advance Directive? No No No No  Would patient like information on creating a medical advance directive? No - Patient declined No - Patient declined No - Patient declined     Current Medications (verified) Outpatient Encounter Medications as of 01/25/2022  Medication Sig   Acetaminophen (TYLENOL PO) Take by mouth as needed.   amLODipine (NORVASC) 5 MG tablet Take 1 tablet (5 mg total) by mouth daily.   aspirin EC 81 MG tablet Take 81 mg by mouth daily. Swallow whole.   fluticasone (FLONASE) 50 MCG/ACT nasal spray  Place 1 spray into both nostrils 2 (two) times daily.   ibuprofen (ADVIL) 800 MG tablet Take 800 mg by mouth every 8 (eight) hours as needed.   meclizine (ANTIVERT) 25 MG tablet Take 1 tablet (25 mg total) by mouth 2 (two) times daily as needed for dizziness.   Multiple Vitamin (MULTIVITAMIN) tablet Take 1 tablet by mouth daily.   valsartan-hydrochlorothiazide (DIOVAN-HCT) 160-12.5 MG tablet Take 1 tablet by mouth daily.   VITAMIN E COMPLEX PO Take by mouth every other day.   No facility-administered encounter medications on file as of 01/25/2022.    Allergies (verified) Patient has no known allergies.   History: Past Medical History:  Diagnosis Date   Chronic back pain    History of COVID-19 05/2020   pt's pcp note in epic 05-14-2020 stated pt has covid;  per pt mild symptoms that resolved   Hyperlipidemia    Hypertension    followed by pcp   Mild mitral regurgitation    per echo in epic 03-16-2010, mild LVH, ef 55-65%, G1DD, mild MR with calcified annulus and no stenosis, trivial TR/ PR, mild AV thickened calicified leaflets but no scerlosis, no stenosis, no regurg   OA (osteoarthritis)    Thickened endometrium    Wears dentures    Wears glasses    Past Surgical History:  Procedure Laterality Date   CARDIAC CATHETERIZATION  10/21/2003   '@[facility]'$  by dr bDarnell Le;  normal coronary angiography , LVF, and wall motion, ef 65%  (  abnormal ekg suggest infart and pre-op eval priot to back surgery)   COLONOSCOPY     last one approx 2017   DILATATION & CURETTAGE/HYSTEROSCOPY WITH MYOSURE N/A 01/13/2021   Procedure: [place];  Surgeon: Nina Bruins, MD;  Location: [facility];  Service: Gynecology;  Laterality: N/A;   KNEE ARTHROSCOPY W/ MENISCAL REPAIR Right 02/16/2004   '@[facility]'$    LUMBAR DISC SURGERY  11/12/2003   '@[facility]'$  by Dr. Lorin Le;  L5--S1   TONSILLECTOMY     child   Family History  Problem Relation Age of Onset   Arthritis  Mother    Hypertension Mother    Breast cancer Mother        cancer in the blood   Heart disease Father    Breast cancer Sister    Colon cancer Neg Hx    Social History   Socioeconomic History   Marital status: Widowed    Spouse name: Not on file   Number of children: Not on file   Years of education: Not on file   Highest education level: Not on file  Occupational History   Not on file  Tobacco Use   Smoking status: Never   Smokeless tobacco: Never  Vaping Use   Vaping Use: Never used  Substance and Sexual Activity   Alcohol use: No   Drug use: Never   Sexual activity: Not on file  Other Topics Concern   Not on file  Social History Narrative   Residence [place] but in town frequently   hh of 1 daughter lives in [place]   No pets   Sleep 7 hours   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         Social Determinants of Radio broadcast assistant Strain: [place]  (01/25/2022)   Overall Financial Resource Strain (CARDIA)    Difficulty of Paying Living Expenses: Not hard at all  Food Insecurity: No [place] (01/25/2022)   Hunger Vital Sign    Worried About Running Out of Food in the Last Year: Never true    [place] in the Last Year: Never true  Transportation Needs: No Transportation Needs (01/25/2022)   PRAPARE - Hydrologist (Medical): No    Lack of Transportation (Non-Medical): No  Physical Activity: Inactive (01/25/2022)   Exercise Vital Sign    Days of Exercise per Week: 0 days    Minutes of Exercise per Session: 0 min  Stress: No Stress Concern Present (01/25/2022)   [place]    Feeling of Stress : Not at all  Social Connections: Moderately Integrated (01/25/2022)   Social Connection and Isolation Panel [NHANES]    Frequency of Communication with Friends and Family: More than three times a week    Frequency of Social Gatherings with  Friends and Family: More than three times a week    Attends Religious Services: More than 4 times per year    Active Member of [employer] or Organizations: Yes    Attends Archivist Meetings: More than 4 times per year    Marital Status: Widowed    Tobacco Counseling Counseling given: Not Answered   Clinical Intake:  Pre-visit preparation completed: No How often do you need to have someone help you when you read instructions, pamphlets, or other written materials from your doctor or pharmacy?: 1 - Never  Diabetic?  No  Interpreter  Needed?: No  Information entered by :: Rolene Arbour LPN   Activities of Daily Living    01/25/2022    1:57 PM  In your present state of health, do you have any difficulty performing the following activities:  Hearing? 0  Vision? 0  Difficulty concentrating or making decisions? 0  Walking or climbing stairs? 0  Dressing or bathing? 0  Doing errands, shopping? 0  Preparing Food and eating ? N  Using the Toilet? N  In the past six months, have you accidently leaked urine? N  Do you have problems with loss of bowel control? N  Managing your Medications? N  Managing your Finances? N  Housekeeping or managing your Housekeeping? N    Patient Care Team: Panosh, Standley Brooking, MD as PCP - General Marybelle Killings, MD (Orthopedic Surgery)  Indicate any recent Medical Services you may have received from other than [facility] providers in the past year (date may be approximate).     Assessment:   This is a routine wellness examination for Nina.  Hearing/Vision screen Hearing Screening - Comments:: Denies hearing difficulties   Vision Screening - Comments:: Wears rx glasses - up to date with routine eye exams with  [facility]  Dietary issues and exercise activities discussed: Current Exercise Habits: The patient does not participate in regular exercise at present, Exercise limited by: None identified   Goals Addressed                This Visit's Progress     No Current Goals (pt-stated)         Depression Screen    01/25/2022    1:56 PM 06/15/2021   10:59 AM 03/31/2021   12:27 PM 01/18/2021    1:18 PM 01/18/2021    1:15 PM 01/15/2020   11:02 AM 05/20/2019    9:59 AM  PHQ 2/9 Scores  PHQ - 2 Score 0 0  0 0 0 0  PHQ- 9 Score  0    0   Exception Documentation   Other- indicate reason in comment box      Not completed   PATIENT CHOOSE NOT TO ANSWER        Fall Risk    01/25/2022    1:58 PM 06/15/2021   10:59 AM 03/31/2021   12:25 PM 01/18/2021    1:17 PM 01/15/2020   11:03 AM  [place] in the past year? 0 0 0 0 0  Number falls in past yr: 0 0  0   Injury with Fall? 0 0  0   Risk for fall due to : No Fall Risks No Fall Risks     Follow up Falls prevention discussed Falls evaluation completed  Falls evaluation completed   Comment    uses cane     FALL RISK PREVENTION PERTAINING TO THE HOME:  Any stairs in or around the home? Yes  If so, are there any without handrails? No  Home free of loose throw rugs in walkways, pet beds, electrical cords, etc? Yes  Adequate lighting in your home to reduce risk of falls? Yes   ASSISTIVE DEVICES UTILIZED TO PREVENT FALLS:  Life alert? No  Use of a cane, walker or w/c? Yes  Grab bars in the bathroom? Yes  Shower chair or bench in shower? No  Elevated toilet seat or a handicapped toilet? Yes   TIMED UP AND GO:  Was the test performed? No . Audio Visit  Cognitive Function:        01/25/2022    1:58 PM  6CIT Screen  What Year? 0 points  What month? 0 points  What time? 0 points  Count back from 20 0 points  Months in reverse 0 points  Repeat phrase 0 points  Total Score 0 points    Immunizations Immunization History  Administered Date(s) Administered   Fluad Quad(high Dose 65+) 03/11/2019, 01/15/2020   Influenza Whole 01/02/2009, 01/04/2010   Influenza, High Dose Seasonal PF 05/15/2015, 02/15/2016, 02/16/2017, 02/27/2018    Influenza,inj,Quad PF,6+ Mos 01/09/2013, 03/27/2014   Moderna Sars-Covid-2 Vaccination 05/22/2019, 06/19/2019, 02/05/2020   Pneumococcal Conjugate-13 05/15/2015   Pneumococcal Polysaccharide-23 05/16/2016   Td 04/11/2004    TDAP status: Due, Education has been provided regarding the importance of this vaccine. Advised may receive this vaccine at local pharmacy or Health Dept. Aware to provide a copy of the vaccination record if obtained from local pharmacy or Health Dept. Verbalized acceptance and understanding.  Flu Vaccine status: Due, Education has been provided regarding the importance of this vaccine. Advised may receive this vaccine at local pharmacy or Health Dept. Aware to provide a copy of the vaccination record if obtained from local pharmacy or Health Dept. Verbalized acceptance and understanding.  Pneumococcal vaccine status: Up to date  Covid-19 vaccine status: Completed vaccines  Qualifies for Shingles Vaccine? Yes   Zostavax completed No   Shingrix Completed?: No.    Education has been provided regarding the importance of this vaccine. Patient has been advised to call insurance company to determine out of pocket expense if they have not yet received this vaccine. Advised may also receive vaccine at local pharmacy or Health Dept. Verbalized acceptance and understanding.  Screening Tests Health Maintenance  Topic Date Due   COVID-19 Vaccine (4 - Moderna risk series) 02/10/2022 (Originally 04/01/2020)   Zoster Vaccines- Shingrix (1 of 2) 04/27/2022 (Originally 08/14/1959)   INFLUENZA VACCINE  07/10/2022 (Originally 11/09/2021)   TETANUS/TDAP  01/26/2023 (Originally 04/11/2014)   Pneumonia Vaccine 21+ Years old  Completed   DEXA SCAN  Completed   Hepatitis C Screening  Completed   HPV VACCINES  Aged Out   COLONOSCOPY (Pts 45-47yr Insurance coverage will need to be confirmed)  Discontinued    Health Maintenance  There are no preventive care reminders to display for this  patient.   Colorectal cancer screening: No longer required.   Mammogram status: No longer required due to Age.  Bone Density status: Completed 04/26/19. Results reflect: Bone density results: OSTEOPOROSIS. Repeat every   years.  Lung Cancer Screening: (Low Dose CT Chest recommended if Age 79-80years, 30 pack-year currently smoking OR have quit w/in 15years.) does not qualify.     Additional Screening:  Hepatitis C Screening: does not qualify; Completed   Vision Screening: Recommended annual ophthalmology exams for early detection of glaucoma and other disorders of the eye. Is the patient up to date with their annual eye exam?  Yes  Who is the provider or what is the name of the office in which the patient attends annual eye exams? [facility] pt is not established with a provider, would they like to be referred to a provider to establish care? No .   Dental Screening: Recommended annual dental exams for proper oral hygiene  Community Resource Referra l / Chronic Care Management:  CRR required this visit?  No   CCM required this visit?  No      Plan:  I have personally reviewed and noted the following in the patient's chart:   Medical and social history Use of alcohol, tobacco or illicit drugs  Current medications and supplements including opioid prescriptions. Patient is not currently taking opioid prescriptions. Functional ability and status Nutritional status Physical activity Advanced directives List of other physicians Hospitalizations, surgeries, and ER visits in previous 12 months Vitals Screenings to include cognitive, depression, and falls Referrals and appointments  In addition, I have reviewed and discussed with patient certain preventive protocols, quality metrics, and best practice recommendations. A written personalized care plan for preventive services as well as general preventive health recommendations were provided to patient.      Nina Peaches, LPN   02/40/9735   Nurse Notes: None

## 2022-01-25 NOTE — Patient Instructions (Addendum)
[name] , Thank you for taking time to come for your Medicare Wellness Visit. I appreciate your ongoing commitment to your health goals. Please review the following plan we discussed and let me know if I can assist you in the future.   These are the goals we discussed:  Goals       No Current Goals (pt-stated)        This is a list of the screening recommended for you and due dates:  Health Maintenance  Topic Date Due   COVID-19 Vaccine (4 - Moderna risk series) 02/10/2022*   Zoster (Shingles) Vaccine (1 of 2) 04/27/2022*   Flu Shot  07/10/2022*   Tetanus Vaccine  01/26/2023*   Pneumonia Vaccine  Completed   DEXA scan (bone density measurement)  Completed   Hepatitis C Screening: USPSTF Recommendation to screen - Ages 61-79 yo.  Completed   HPV Vaccine  Aged Out   Colon Cancer Screening  Discontinued  *Topic was postponed. The date shown is not the original due date.    Advanced directives: Advance directive discussed with you today. Even though you declined this today, please call our office should you change your mind, and we can give you the proper paperwork for you to fill out.   Conditions/risks identified: None  Next appointment: Follow up in one year for your annual wellness visit     Preventive Care 65 Years and Older, Female Preventive care refers to lifestyle choices and visits with your health care provider that can promote health and wellness. What does preventive care include? A yearly physical exam. This is also called an annual well check. Dental exams once or twice a year. Routine eye exams. Ask your health care provider how often you should have your eyes checked. Personal lifestyle choices, including: Daily care of your teeth and gums. Regular physical activity. Eating a healthy diet. Avoiding tobacco and drug use. Limiting alcohol use. Practicing safe sex. Taking low-dose aspirin every day. Taking vitamin and mineral supplements as recommended by your  health care provider. What happens during an annual well check? The services and screenings done by your health care provider during your annual well check will depend on your age, overall health, lifestyle risk factors, and family history of disease. Counseling  Your health care provider may ask you questions about your: Alcohol use. Tobacco use. Drug use. Emotional well-being. Home and relationship well-being. Sexual activity. Eating habits. History of falls. Memory and ability to understand (cognition). Work and work Statistician. Reproductive health. Screening  You may have the following tests or measurements: Height, weight, and BMI. Blood pressure. Lipid and cholesterol levels. These may be checked every 5 years, or more frequently if you are over 16 years old. Skin check. Lung cancer screening. You may have this screening every year starting at age 4 if you have a 30-pack-year history of smoking and currently smoke or have quit within the past 15 years. Fecal occult blood test (FOBT) of the stool. You may have this test every year starting at age 30. Flexible sigmoidoscopy or colonoscopy. You may have a sigmoidoscopy every 5 years or a colonoscopy every 10 years starting at age 21. Hepatitis C blood test. Hepatitis B blood test. Sexually transmitted disease (STD) testing. Diabetes screening. This is done by checking your blood sugar (glucose) after you have not eaten for a while (fasting). You may have this done every 1-3 years. Bone density scan. This is done to screen for osteoporosis. You may have this  done starting at age 52. Mammogram. This may be done every 1-2 years. Talk to your health care provider about how often you should have regular mammograms. Talk with your health care provider about your test results, treatment options, and if necessary, the need for more tests. Vaccines  Your health care provider may recommend certain vaccines, such as: Influenza vaccine.  This is recommended every year. Tetanus, diphtheria, and acellular pertussis (Tdap, Td) vaccine. You may need a Td booster every 10 years. Zoster vaccine. You may need this after age 12. Pneumococcal 13-valent conjugate (PCV13) vaccine. One dose is recommended after age 62. Pneumococcal polysaccharide (PPSV23) vaccine. One dose is recommended after age 72. Talk to your health care provider about which screenings and vaccines you need and how often you need them. This information is not intended to replace advice given to you by your health care provider. Make sure you discuss any questions you have with your health care provider. Document Released: 04/24/2015 Document Revised: 12/16/2015 Document Reviewed: 01/27/2015 Elsevier Interactive Patient Education  2017 [place] Prevention in the Home Falls can cause injuries. They can happen to people of all ages. There are many things you can do to make your home safe and to help prevent falls. What can I do on the outside of my home? Regularly fix the edges of walkways and driveways and fix any cracks. Remove anything that might make you trip as you walk through a door, such as a raised step or threshold. Trim any bushes or trees on the path to your home. Use bright outdoor lighting. Clear any walking paths of anything that might make someone trip, such as rocks or tools. Regularly check to see if handrails are loose or broken. Make sure that both sides of any steps have handrails. Any raised decks and porches should have guardrails on the edges. Have any leaves, snow, or ice cleared regularly. Use sand or salt on walking paths during winter. Clean up any spills in your garage right away. This includes oil or grease spills. What can I do in the bathroom? Use night lights. Install grab bars by the toilet and in the tub and shower. Do not use towel bars as grab bars. Use non-skid mats or decals in the tub or shower. If you need to sit  down in the shower, use a plastic, non-slip stool. Keep the floor dry. Clean up any water that spills on the floor as soon as it happens. Remove soap buildup in the tub or shower regularly. Attach bath mats securely with double-sided non-slip rug tape. Do not have throw rugs and other things on the floor that can make you trip. What can I do in the bedroom? Use night lights. Make sure that you have a light by your bed that is easy to reach. Do not use any sheets or blankets that are too big for your bed. They should not hang down onto the floor. Have a firm chair that has side arms. You can use this for support while you get dressed. Do not have throw rugs and other things on the floor that can make you trip. What can I do in the kitchen? Clean up any spills right away. Avoid walking on wet floors. Keep items that you use a lot in easy-to-reach places. If you need to reach something above you, use a strong step stool that has a grab bar. Keep electrical cords out of the way. Do not use floor polish or wax  that makes floors slippery. If you must use wax, use non-skid floor wax. Do not have throw rugs and other things on the floor that can make you trip. What can I do with my stairs? Do not leave any items on the stairs. Make sure that there are handrails on both sides of the stairs and use them. Fix handrails that are broken or loose. Make sure that handrails are as long as the stairways. Check any carpeting to make sure that it is firmly attached to the stairs. Fix any carpet that is loose or worn. Avoid having throw rugs at the top or bottom of the stairs. If you do have throw rugs, attach them to the floor with carpet tape. Make sure that you have a light switch at the top of the stairs and the bottom of the stairs. If you do not have them, ask someone to add them for you. What else can I do to help prevent falls? Wear shoes that: Do not have high heels. Have rubber bottoms. Are  comfortable and fit you well. Are closed at the toe. Do not wear sandals. If you use a stepladder: Make sure that it is fully opened. Do not climb a closed stepladder. Make sure that both sides of the stepladder are locked into place. Ask someone to hold it for you, if possible. Clearly mark and make sure that you can see: Any grab bars or handrails. First and last steps. Where the edge of each step is. Use tools that help you move around (mobility aids) if they are needed. These include: Canes. Walkers. Scooters. Crutches. Turn on the lights when you go into a dark area. Replace any light bulbs as soon as they burn out. Set up your furniture so you have a clear path. Avoid moving your furniture around. If any of your floors are uneven, fix them. If there are any pets around you, be aware of where they are. Review your medicines with your doctor. Some medicines can make you feel dizzy. This can increase your chance of falling. Ask your doctor what other things that you can do to help prevent falls. This information is not intended to replace advice given to you by your health care provider. Make sure you discuss any questions you have with your health care provider. Document Released: 01/22/2009 Document Revised: 09/03/2015 Document Reviewed: 05/02/2014 Elsevier Interactive Patient Education  2017 [employer].

## 2022-02-02 ENCOUNTER — Ambulatory Visit: Payer: Medicare Other | Admitting: Obstetrics & Gynecology

## 2022-02-03 ENCOUNTER — Encounter: Payer: Self-pay | Admitting: Obstetrics & Gynecology

## 2022-02-03 ENCOUNTER — Ambulatory Visit (INDEPENDENT_AMBULATORY_CARE_PROVIDER_SITE_OTHER): Payer: Medicare Other | Admitting: Obstetrics & Gynecology

## 2022-02-03 VITALS — BP 114/80 | HR 88 | Ht 63.75 in | Wt 251.0 lb

## 2022-02-03 DIAGNOSIS — Z01419 Encounter for gynecological examination (general) (routine) without abnormal findings: Secondary | ICD-10-CM | POA: Diagnosis not present

## 2022-02-03 DIAGNOSIS — Z23 Encounter for immunization: Secondary | ICD-10-CM

## 2022-02-03 DIAGNOSIS — Z780 Asymptomatic menopausal state: Secondary | ICD-10-CM

## 2022-02-03 NOTE — Progress Notes (Signed)
Nina Le January 19, 1941 [id number]   History:    81 y.o. G3P3L3 Widowed.  RP:  Established patient presenting for annual gyn exam   HPI:  Postmenopause, well on no HRT.  No PMB.  Hysteroscopy D&C Myosure on 01/13/21, patho benign polyp.  Abstinent. Pap Neg many yrs ago.  No h/o abnormal Pap.  No indication for a Pap at this time.  Breasts normal.  Mammo Neg 04/09/21.  Urine and BMs normal.  BMI 43.42.  Bone Density Normal 04-16-19. COLONOSCOPY: 07-22-15.   Past medical history,surgical history, family history and social history were all reviewed and documented in the EPIC chart.  Gynecologic History No LMP recorded. Patient is postmenopausal.  Obstetric History OB History  Gravida Para Term Preterm AB Living  '3 3 3     2  '$ SAB IAB Ectopic Multiple Live Births          3    # Outcome Date GA Lbr Len/2nd Weight Sex Delivery Anes PTL Lv  3 Term           2 Term           1 Term              ROS: A ROS was performed and pertinent positives and negatives are included in the history. GENERAL: No fevers or chills. HEENT: No change in vision, no earache, sore throat or sinus congestion. NECK: No pain or stiffness. CARDIOVASCULAR: No chest pain or pressure. No palpitations. PULMONARY: No shortness of breath, cough or wheeze. GASTROINTESTINAL: No abdominal pain, nausea, vomiting or diarrhea, melena or bright red blood per rectum. GENITOURINARY: No urinary frequency, urgency, hesitancy or dysuria. MUSCULOSKELETAL: No joint or muscle pain, no back pain, no recent trauma. DERMATOLOGIC: No rash, no itching, no lesions. ENDOCRINE: No polyuria, polydipsia, no heat or cold intolerance. No recent change in weight. HEMATOLOGICAL: No anemia or easy bruising or bleeding. NEUROLOGIC: No headache, seizures, numbness, tingling or weakness. PSYCHIATRIC: No depression, no loss of interest in normal activity or change in sleep pattern.     Exam:   BP 114/80   Pulse 88   Ht 5' 3.75" (1.619 m)   Wt 251  lb (113.9 kg)   SpO2 97%   BMI 43.42 kg/m   Body mass index is 43.42 kg/m.  General appearance : Well developed well nourished female. No acute distress HEENT: Eyes: no retinal hemorrhage or exudates,  Neck supple, trachea midline, no carotid bruits, no thyroidmegaly Lungs: Clear to auscultation, no rhonchi or wheezes, or rib retractions  Heart: Regular rate and rhythm, no murmurs or gallops Breast:Examined in sitting and supine position were symmetrical in appearance, no palpable masses or tenderness,  no skin retraction, no nipple inversion, no nipple discharge, no skin discoloration, no axillary or supraclavicular lymphadenopathy Abdomen: no palpable masses or tenderness, no rebound or guarding Extremities: no edema or skin discoloration or tenderness  Pelvic: Vulva: Normal             Vagina: No gross lesions or discharge  Cervix: No gross lesions or discharge  Uterus  AV, normal size, shape and consistency, non-tender and mobile  Adnexa  Without masses or tenderness  Anus: Normal   Assessment/Plan:  81 y.o. female for annual exam   1. Well female exam with routine gynecological exam Postmenopause, well on no HRT.  No PMB.  Hysteroscopy D&C Myosure on 01/13/21, patho benign polyp.  Abstinent. Pap Neg many yrs ago.  No h/o abnormal Pap.  No indication for a Pap at this time.  Breasts normal.  Mammo Neg 04/09/21.  Urine and BMs normal.  BMI 43.42.  Bone Density Normal 04-16-19. COLONOSCOPY: 07-22-15.  2. Postmenopause  Postmenopause, well on no HRT.  No PMB.  Bone Density Normal 04-16-19.   Princess Bruins MD, 3:13 PM 02/03/2022

## 2022-03-14 ENCOUNTER — Other Ambulatory Visit: Payer: Self-pay | Admitting: Internal Medicine

## 2022-03-14 DIAGNOSIS — Z1231 Encounter for screening mammogram for malignant neoplasm of breast: Secondary | ICD-10-CM

## 2022-03-21 NOTE — Progress Notes (Unsigned)
ACUTE VISIT No chief complaint on file.  HPI: Ms.Nina Le is a 81 y.o. female, who is here today complaining of *** HPI  Review of Systems See other pertinent positives and negatives in HPI.  Current Outpatient Medications on File Prior to Visit  Medication Sig Dispense Refill   Acetaminophen (TYLENOL PO) Take by mouth as needed.     amLODipine (NORVASC) 5 MG tablet Take 1 tablet (5 mg total) by mouth daily. 10 tablet 0   aspirin EC 81 MG tablet Take 81 mg by mouth daily. Swallow whole.     ibuprofen (ADVIL) 800 MG tablet Take 800 mg by mouth every 8 (eight) hours as needed.     Multiple Vitamin (MULTIVITAMIN) tablet Take 1 tablet by mouth daily.     valsartan-hydrochlorothiazide (DIOVAN-HCT) 160-12.5 MG tablet Take 1 tablet by mouth daily. 10 tablet 0   VITAMIN E COMPLEX PO Take by mouth daily.     No current facility-administered medications on file prior to visit.    Past Medical History:  Diagnosis Date   Chronic back pain    History of COVID-19 05/2020   pt's pcp note in epic 05-14-2020 stated pt has covid;  per pt mild symptoms that resolved   Hyperlipidemia    Hypertension    followed by pcp   Mild mitral regurgitation    per echo in epic 03-16-2010, mild LVH, ef 55-65%, G1DD, mild MR with calcified annulus and no stenosis, trivial TR/ PR, mild AV thickened calicified leaflets but no scerlosis, no stenosis, no regurg   OA (osteoarthritis)    Thickened endometrium    Wears dentures    Wears glasses    No Known Allergies  Social History   Socioeconomic History   Marital status: Widowed    Spouse name: Not on file   Number of children: Not on file   Years of education: Not on file   Highest education level: Not on file  Occupational History   Not on file  Tobacco Use   Smoking status: Never   Smokeless tobacco: Never  Vaping Use   Vaping Use: Never used  Substance and Sexual Activity   Alcohol use: No   Drug use: Never   Sexual activity: Not  Currently    Partners: Male    Birth control/protection: Post-menopausal    Comment: older than 61, less than 5  Other Topics Concern   Not on file  Social History Narrative   Residence [place] but in town frequently   hh of 1 daughter lives in [place]   No pets   Sleep 7 hours   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         Social Determinants of Radio broadcast assistant Strain: [place]  (01/25/2022)   Overall Financial Resource Strain (CARDIA)    Difficulty of Paying Living Expenses: Not hard at all  Food Insecurity: No [place] (01/25/2022)   Hunger Vital Sign    Worried About Running Out of Food in the Last Year: Never true    [place] in the Last Year: Never true  Transportation Needs: No Transportation Needs (01/25/2022)   PRAPARE - Hydrologist (Medical): No    Lack of Transportation (Non-Medical): No  Physical Activity: Inactive (01/25/2022)   Exercise Vital Sign    Days of Exercise per Week: 0 days    Minutes of Exercise per Session: 0 min  Stress: No Stress  Concern Present (01/25/2022)   Interior    Feeling of Stress : Not at all  Social Connections: Moderately Integrated (01/25/2022)   Social Connection and Isolation Panel [NHANES]    Frequency of Communication with Friends and Family: More than three times a week    Frequency of Social Gatherings with Friends and Family: More than three times a week    Attends Religious Services: More than 4 times per year    Active Member of [employer] or Organizations: Yes    Attends Archivist Meetings: More than 4 times per year    Marital Status: Widowed    There were no vitals filed for this visit. There is no height or weight on file to calculate BMI.  Physical Exam  ASSESSMENT AND PLAN: There are no diagnoses linked to this encounter.  No follow-ups on file.  Kielan Dreisbach G. Martinique,  MD  [facility]. [place] office.  Discharge Instructions   None

## 2022-03-22 ENCOUNTER — Telehealth: Payer: Self-pay | Admitting: Internal Medicine

## 2022-03-22 ENCOUNTER — Encounter: Payer: Self-pay | Admitting: Family Medicine

## 2022-03-22 ENCOUNTER — Ambulatory Visit (INDEPENDENT_AMBULATORY_CARE_PROVIDER_SITE_OTHER): Payer: Medicare Other

## 2022-03-22 ENCOUNTER — Ambulatory Visit (INDEPENDENT_AMBULATORY_CARE_PROVIDER_SITE_OTHER): Payer: Medicare Other | Admitting: Family Medicine

## 2022-03-22 VITALS — BP 130/80 | HR 86 | Temp 97.8°F | Resp 16 | Ht 63.75 in | Wt 248.1 lb

## 2022-03-22 DIAGNOSIS — M545 Low back pain, unspecified: Secondary | ICD-10-CM | POA: Diagnosis not present

## 2022-03-22 MED ORDER — MELOXICAM 7.5 MG PO TABS: 7.5000 mg | ORAL_TABLET | Freq: Every day | ORAL | 0 refills | Status: DC

## 2022-03-22 NOTE — Patient Instructions (Addendum)
A few things to remember from today's visit:  Acute bilateral low back pain, unspecified whether sciatica present - Plan: DG Lumbar Spine Complete, meloxicam (MOBIC) 7.5 MG tablet  Meloxicam daily for 10 days. Local heat. Activities as tolerated. Monitor for new symptoms. Tylenol 500 mg 3-4 times per day.  Please be sure medication list is accurate. If a new problem present, please set up appointment sooner than planned today.

## 2022-03-22 NOTE — Telephone Encounter (Signed)
Pt rquesting Acetaminophen (TYLENOL PO)   meloxicam (MOBIC) 7.5 MG tablet be sent to  [facility] [address] Phone: [phone]  Fax: [phone]    Instead of [place]. Please give patient a call when this is transferred

## 2022-03-22 NOTE — Telephone Encounter (Signed)
Patient calling back to check on progress of this refill.

## 2022-03-22 NOTE — Telephone Encounter (Signed)
Rx re-sent to [facility] in [place].

## 2022-03-22 NOTE — Addendum Note (Signed)
Addended by: Rodrigo Ran on: 03/22/2022 02:44 PM   Modules accepted: Orders

## 2022-03-23 NOTE — Telephone Encounter (Signed)
Attempt to reach pt. Phone was ringing then silent. Retry again. Phone went to busy.

## 2022-04-21 ENCOUNTER — Telehealth: Payer: Self-pay | Admitting: Internal Medicine

## 2022-04-21 ENCOUNTER — Other Ambulatory Visit: Payer: Self-pay

## 2022-04-21 ENCOUNTER — Other Ambulatory Visit: Payer: Self-pay | Admitting: Adult Health

## 2022-04-21 DIAGNOSIS — M545 Low back pain, unspecified: Secondary | ICD-10-CM

## 2022-04-21 MED ORDER — MELOXICAM 7.5 MG PO TABS: 7.5000 mg | ORAL_TABLET | Freq: Every day | ORAL | 0 refills | Status: AC

## 2022-04-21 MED ORDER — MELOXICAM 7.5 MG PO TABS: 7.5000 mg | ORAL_TABLET | Freq: Every day | ORAL | 0 refills | Status: DC

## 2022-04-21 NOTE — Telephone Encounter (Addendum)
Dr. Velora Mediate patient  Requested medication not on med list.   Last OV with Dr. Martinique on 03/22/22.    Please advise if okay to send in refill.

## 2022-04-21 NOTE — Addendum Note (Signed)
Addended by: Otilio Miu on: 04/21/2022 12:30 PM   Modules accepted: Orders

## 2022-04-21 NOTE — Telephone Encounter (Signed)
Called patient refill for Meloxicam sent to [facility] in [place].

## 2022-04-21 NOTE — Telephone Encounter (Signed)
Patient states she is experiencing the same pain she was experiencing when she came in on 03/22/22. Says pain is in right buttock area and never totally went away. Requesting a refill of meloxicam (MOBIC) 7.5 MG tablet . Declined OV because it's difficult for her to move.

## 2022-05-09 ENCOUNTER — Ambulatory Visit
Admission: RE | Admit: 2022-05-09 | Discharge: 2022-05-09 | Disposition: A | Discharge status: Home / Self Care | Payer: Medicare Other | Source: Ambulatory Visit | Attending: Internal Medicine | Admitting: Internal Medicine

## 2022-05-09 DIAGNOSIS — Z1231 Encounter for screening mammogram for malignant neoplasm of breast: Secondary | ICD-10-CM

## 2022-06-21 ENCOUNTER — Other Ambulatory Visit: Payer: Self-pay | Admitting: Internal Medicine

## 2022-06-21 DIAGNOSIS — I10 Essential (primary) hypertension: Secondary | ICD-10-CM

## 2022-06-21 DIAGNOSIS — Z79899 Other long term (current) drug therapy: Secondary | ICD-10-CM

## 2022-06-22 ENCOUNTER — Other Ambulatory Visit: Payer: Self-pay

## 2022-06-22 DIAGNOSIS — I10 Essential (primary) hypertension: Secondary | ICD-10-CM

## 2022-06-22 DIAGNOSIS — Z79899 Other long term (current) drug therapy: Secondary | ICD-10-CM

## 2022-06-22 NOTE — Telephone Encounter (Signed)
Please arrange for  CMP to be done   for medication  monitoring  has been over a year .  Dx hypertension and medication management .  Can refill medicines for 90 days .

## 2022-06-22 NOTE — Telephone Encounter (Signed)
Last refills Valsartan-06/17/21--10 tabs Amlodopine-06/17/21--10 tabs  Last OV- Dr. Jordan-03/22/22  No future OV scheduled.  Please advise is okay to send in 90 day supply

## 2022-06-27 ENCOUNTER — Other Ambulatory Visit: Payer: Medicare Other

## 2022-06-27 DIAGNOSIS — I10 Essential (primary) hypertension: Secondary | ICD-10-CM

## 2022-06-27 DIAGNOSIS — Z79899 Other long term (current) drug therapy: Secondary | ICD-10-CM

## 2022-06-27 LAB — COMPREHENSIVE METABOLIC PANEL
ALT: 15 U/L (ref 0–35)
AST: 22 U/L (ref 0–37)
Albumin: 4 g/dL (ref 3.5–5.2)
Alkaline Phosphatase: 106 U/L (ref 39–117)
BUN: 18 mg/dL (ref 6–23)
CO2: 28 mEq/L (ref 19–32)
Calcium: 10 mg/dL (ref 8.4–10.5)
Chloride: 101 mEq/L (ref 96–112)
Creatinine, Ser: 0.7 mg/dL (ref 0.40–1.20)
GFR: 82 mL/min (ref >60.00)
Glucose, Bld: 100 mg/dL — ABNORMAL HIGH (ref 70–99)
Potassium: 4.1 mEq/L (ref 3.5–5.1)
Sodium: 138 mEq/L (ref 135–145)
Total Bilirubin: 0.4 mg/dL (ref 0.2–1.2)
Total Protein: 7.9 g/dL (ref 6.0–8.3)

## 2022-06-27 NOTE — Progress Notes (Signed)
Chemistry panel is normal except borderline blood sugar  . But has NO diabetes  Continue attention  to  lifestyle intervention healthy eating and activity .

## 2022-06-27 NOTE — Addendum Note (Signed)
Addended by: Rosalyn Gess D on: 06/27/2022 01:01 PM   Modules accepted: Orders

## 2022-09-19 ENCOUNTER — Other Ambulatory Visit: Payer: Self-pay | Admitting: Internal Medicine

## 2022-09-19 DIAGNOSIS — I10 Essential (primary) hypertension: Secondary | ICD-10-CM

## 2022-09-19 DIAGNOSIS — Z79899 Other long term (current) drug therapy: Secondary | ICD-10-CM

## 2022-11-14 ENCOUNTER — Telehealth: Payer: Self-pay | Admitting: Internal Medicine

## 2022-11-14 NOTE — Telephone Encounter (Signed)
Spoke to pt. Scheduled a virtual visit with provider for tomorrow schedule.

## 2022-11-14 NOTE — Telephone Encounter (Signed)
Pt experiencing vertigo x 7d, requesting a prescription, cannot remember the name of the medication she took last time, cannot drive and hard to walk straight

## 2022-11-15 ENCOUNTER — Telehealth (INDEPENDENT_AMBULATORY_CARE_PROVIDER_SITE_OTHER): Payer: Medicare Other | Admitting: Internal Medicine

## 2022-11-15 ENCOUNTER — Encounter: Payer: Self-pay | Admitting: Internal Medicine

## 2022-11-15 VITALS — Ht 63.75 in | Wt 240.0 lb

## 2022-11-15 DIAGNOSIS — R42 Dizziness and giddiness: Secondary | ICD-10-CM

## 2022-11-15 DIAGNOSIS — Z79899 Other long term (current) drug therapy: Secondary | ICD-10-CM

## 2022-11-15 MED ORDER — MECLIZINE HCL 25 MG PO TABS: 12.5000 mg | ORAL_TABLET | Freq: Two times a day (BID) | ORAL | 0 refills | Status: DC | PRN

## 2022-11-15 NOTE — Progress Notes (Signed)
Virtual Visit via Video Note  I connected with Nina Le on 11/15/22 at  3:30 PM EDT by a video enabled telemedicine application and verified that I am speaking with the correct person using two identifiers. Location patient: home Location provider:work office Persons participating in the virtual visit: patient, provider Patient aware  of the limitations of evaluation and management by telemedicine and  availability of in person appointments. and agreed to proceed.   HPI: Nina Alston Mcafee presents for video visit for onset of  "vertigo" room spin around and on going No progression but persists   See 12 22 note vertigo  felt peripheral   and awake with this and still present after 10 days   Had old med but was afraid to take cause was old  Prev has had this and did take a med that helped her feel better  No associated sx  felt sick on day but no neuro sx such as vision loss diplopia balance issue motor weakness or change  change head ache etc .  No falling  or recent illness Says bp in control  and no new meds   ROS: See pertinent positives and negatives per HPI.  Past Medical History:  Diagnosis Date   Chronic back pain    History of COVID-19 05/2020   pt's pcp note in epic 05-14-2020 stated pt has covid;  per pt mild symptoms that resolved   Hyperlipidemia    Hypertension    followed by pcp   Mild mitral regurgitation    per echo in epic 03-16-2010, mild LVH, ef 55-65%, G1DD, mild MR with calcified annulus and no stenosis, trivial TR/ PR, mild AV thickened calicified leaflets but no scerlosis, no stenosis, no regurg   OA (osteoarthritis)    Thickened endometrium    Wears dentures    Wears glasses     Past Surgical History:  Procedure Laterality Date   CARDIAC CATHETERIZATION  10/21/2003   @[facility]  by dr Smitty Cords brodie;  normal coronary angiography , LVF, and wall motion, ef 65%  (abnormal ekg suggest infart and pre-op eval priot to back surgery)   COLONOSCOPY     last  one approx 2017   DILATATION & CURETTAGE/HYSTEROSCOPY WITH MYOSURE N/A 01/13/2021   Procedure: DILATATION & CURETTAGE/HYSTEROSCOPY WITH MYOSURE;  Surgeon: Genia Del, MD;  Location: [facility];  Service: Gynecology;  Laterality: N/A;   KNEE ARTHROSCOPY W/ MENISCAL REPAIR Right 02/16/2004   @[facility]    LUMBAR DISC SURGERY  11/12/2003   @[facility]  by Dr. Ophelia Charter;  L5--S1   TONSILLECTOMY     child    Family History  Problem Relation Age of Onset   Arthritis Mother    Hypertension Mother    Breast cancer Mother        cancer in the blood   Heart disease Father    Breast cancer Sister     Social History   Tobacco Use   Smoking status: Never   Smokeless tobacco: Never  Vaping Use   Vaping status: Never Used  Substance Use Topics   Alcohol use: No   Drug use: Never      Current Outpatient Medications:    Acetaminophen (TYLENOL PO), Take by mouth as needed., Disp: , Rfl:    amLODipine (NORVASC) 5 MG tablet, Take 1 tablet by mouth once daily, Disp: 90 tablet, Rfl: 0   Ferrous Sulfate (IRON PO), Take by mouth., Disp: , Rfl:    Multiple Vitamin (MULTIVITAMIN) tablet, Take 1 tablet  by mouth daily., Disp: , Rfl:    valsartan-hydrochlorothiazide (DIOVAN-HCT) 160-12.5 MG tablet, TAKE 1 TABLET BY MOUTH ONCE DAILY *STOP  LISINOPRIL/HCTZ*, Disp: 90 tablet, Rfl: 0   aspirin EC 81 MG tablet, Take 81 mg by mouth daily. Swallow whole. (Patient not taking: Reported on 11/15/2022), Disp: , Rfl:    meclizine (ANTIVERT) 25 MG tablet, Take 0.5-1 tablets (12.5-25 mg total) by mouth 2 (two) times daily as needed for dizziness. vertigo, Disp: 30 tablet, Rfl: 0   VITAMIN E COMPLEX PO, Take by mouth daily. (Patient not taking: Reported on 11/15/2022), Disp: , Rfl:   EXAM: BP Readings from Last 3 Encounters:  03/22/22 130/80  02/03/22 114/80  06/15/21 132/76    VITALS per patient if applicable:  GENERAL: alert, oriented, appears well and in no acute distress non toxic normal speecha nd  facial symmetry    HEENT: atraumatic, conjunttiva clear, no obvious abnormalities on inspection of external nose and ears  NECK: normal movements of the head and neck  LUNGS: on inspection no signs of respiratory distress, breathing rate appears normal, no obvious gross SOB, gasping or wheezing  CV: no obvious cyanosis  PSYCH/NEURO: pleasant and cooperative, no obvious depression or anxiety, speech and thought processing grossly intact Lab Results  Component Value Date   WBC 6.9 03/31/2021   HGB 13.0 03/31/2021   HCT 40.3 03/31/2021   PLT 243.0 03/31/2021   GLUCOSE 100 (H) 06/27/2022   CHOL 209 (H) 06/10/2020   TRIG 72.0 06/10/2020   HDL 53.50 06/10/2020   LDLCALC 141 (H) 06/10/2020   ALT 15 06/27/2022   AST 22 06/27/2022   NA 138 06/27/2022   K 4.1 06/27/2022   CL 101 06/27/2022   CREATININE 0.70 06/27/2022   BUN 18 06/27/2022   CO2 28 06/27/2022   TSH 0.87 03/31/2021   HGBA1C 5.8 06/10/2020    ASSESSMENT AND PLAN:  Discussed the following assessment and plan:    ICD-10-CM   1. Vertigo  R42     2. Dizziness  R42 meclizine (ANTIVERT) 25 MG tablet    3. Medication management  Z79.899      HT controlled and no new meds  Counseled. Caution with med risk benefot  not curative  but can help with sx  If  persistent or progressive consider neuro rehab  Fu  Doesn't seem like other cause of veritgo based on hx and context   bp controlled and no new meds   Expectant management and discussion of plan and treatment with opportunity to ask questions and all were answered. The patient agreed with the plan and demonstrated an understanding of the instructions.   Advised to call back or seek an in-person evaluation if worsening  or having  further concerns  in interim. Return if symptoms worsen or fail to improve as expected.    Berniece Andreas, MD

## 2022-12-19 ENCOUNTER — Other Ambulatory Visit: Payer: Self-pay | Admitting: Family

## 2022-12-19 DIAGNOSIS — Z79899 Other long term (current) drug therapy: Secondary | ICD-10-CM

## 2022-12-19 DIAGNOSIS — I10 Essential (primary) hypertension: Secondary | ICD-10-CM

## 2022-12-22 DIAGNOSIS — G629 Polyneuropathy, unspecified: Secondary | ICD-10-CM | POA: Diagnosis not present

## 2022-12-23 NOTE — Telephone Encounter (Signed)
Pt checking on progress of this refill. Pt has 4 days of medication left. Aware that provider is OOO until Tuesday and requesting someone else assist with this refill.

## 2023-01-30 ENCOUNTER — Telehealth: Payer: Self-pay

## 2023-01-30 ENCOUNTER — Ambulatory Visit (INDEPENDENT_AMBULATORY_CARE_PROVIDER_SITE_OTHER): Payer: Medicare Other

## 2023-01-30 DIAGNOSIS — Z23 Encounter for immunization: Secondary | ICD-10-CM

## 2023-01-30 NOTE — Telephone Encounter (Signed)
Unsuccessful attempt to reach patient on preferred number listed in notes for scheduled AWV. Left message on voicemail okay to reschedule. 

## 2023-02-07 ENCOUNTER — Encounter: Payer: Self-pay | Admitting: Nurse Practitioner

## 2023-02-07 ENCOUNTER — Ambulatory Visit (INDEPENDENT_AMBULATORY_CARE_PROVIDER_SITE_OTHER): Payer: Medicare Other | Admitting: Nurse Practitioner

## 2023-02-07 VITALS — BP 102/70 | HR 72 | Ht 65.25 in | Wt 248.0 lb

## 2023-02-07 DIAGNOSIS — Z01419 Encounter for gynecological examination (general) (routine) without abnormal findings: Secondary | ICD-10-CM

## 2023-02-07 DIAGNOSIS — Z780 Asymptomatic menopausal state: Secondary | ICD-10-CM

## 2023-02-07 NOTE — Progress Notes (Signed)
   Nina Le [birth year] [id number]   History:  82 y.o. [id number] presents for breast and pelvic exam. Postmenopausal - no HRT, no bleeding. 2022 D&C with mysure, benign polyp. Normal pap history. H/O HTN, HLD.   Gynecologic History No LMP recorded. Patient is postmenopausal.   Contraception: post menopausal status Sexually active: No  Health Maintenance Last Pap: 2011. Results were: Normal Last mammogram: 05/09/2022. Results were: Normal Last colonoscopy: 07/2015. Results were: Normal Last Dexa: 04/16/2019. Results were: Normal  Past medical history, past surgical history, family history and social history were all reviewed and documented in the EPIC chart. Widowed. Daughter moving back here from [place]. Son and family live in [place].   ROS:  A ROS was performed and pertinent positives and negatives are included.  Exam:  Vitals:   02/07/23 1058  BP: 102/70  Pulse: 72  SpO2: 97%  Weight: 248 lb (112.5 kg)  Height: 5' 5.25" (1.657 m)   Body mass index is 40.95 kg/m. Physical Exam Constitutional:      Appearance: Normal appearance. She is obese.  Cardiovascular:     Rate and Rhythm: Normal rate and regular rhythm.  Pulmonary:     Effort: Pulmonary effort is normal.     Breath sounds: Normal breath sounds.  Chest:  Breasts:    Right: Normal.     Left: Normal.  Genitourinary:    General: Normal vulva.     Vagina: Normal.     Cervix: Normal.     Uterus: Normal.      Adnexa: Right adnexa normal and left adnexa normal.     Patient informed chaperone available to be present for breast and pelvic exam. Patient has requested no chaperone to be present. Patient has been advised what will be completed during breast and pelvic exam.   Assessment/Plan:  82 y.o. [id number] for breast and pelvic exam.   Encounter for breast and pelvic examination - Education provided on SBEs, importance of preventative screenings, current guidelines, high calcium diet, regular exercise, and  multivitamin daily.  Labs with PCP.   Postmenopausal - No HRT, no bleeding.   Screening for cervical cancer - Normal Pap history. No longer screening per guidelines.   Screening for breast cancer - Normal mammogram history.  Continue annual screenings.  Normal breast exam today.  Screening for colon cancer - 2017 colonoscopy. No longer screening due to age.   Screening for osteoporosis - Normal bone density in 2021.   Return in about 2 years (around 02/06/2025) for B&P.   Olivia Mackie DNP, 11:20 AM 02/07/2023

## 2023-03-05 DIAGNOSIS — M545 Low back pain, unspecified: Secondary | ICD-10-CM | POA: Diagnosis not present

## 2023-03-05 DIAGNOSIS — Z758 Other problems related to medical facilities and other health care: Secondary | ICD-10-CM | POA: Diagnosis not present

## 2023-03-20 ENCOUNTER — Other Ambulatory Visit: Payer: Self-pay | Admitting: Family

## 2023-03-20 DIAGNOSIS — I10 Essential (primary) hypertension: Secondary | ICD-10-CM

## 2023-03-20 DIAGNOSIS — Z79899 Other long term (current) drug therapy: Secondary | ICD-10-CM

## 2023-04-11 ENCOUNTER — Other Ambulatory Visit: Payer: Self-pay | Admitting: Internal Medicine

## 2023-04-11 DIAGNOSIS — Z1231 Encounter for screening mammogram for malignant neoplasm of breast: Secondary | ICD-10-CM

## 2023-05-12 ENCOUNTER — Ambulatory Visit: Payer: Medicare Other

## 2023-06-19 ENCOUNTER — Ambulatory Visit
Admission: RE | Admit: 2023-06-19 | Discharge: 2023-06-19 | Disposition: A | Discharge status: Home / Self Care | Payer: Medicare Other | Source: Ambulatory Visit | Attending: Internal Medicine | Admitting: Internal Medicine

## 2023-06-19 ENCOUNTER — Other Ambulatory Visit: Payer: Self-pay | Admitting: Family

## 2023-06-19 DIAGNOSIS — I10 Essential (primary) hypertension: Secondary | ICD-10-CM

## 2023-06-19 DIAGNOSIS — Z79899 Other long term (current) drug therapy: Secondary | ICD-10-CM

## 2023-06-19 DIAGNOSIS — Z1231 Encounter for screening mammogram for malignant neoplasm of breast: Secondary | ICD-10-CM

## 2023-06-26 ENCOUNTER — Other Ambulatory Visit: Payer: Self-pay | Admitting: Internal Medicine

## 2023-06-26 DIAGNOSIS — I10 Essential (primary) hypertension: Secondary | ICD-10-CM

## 2023-06-26 DIAGNOSIS — Z79899 Other long term (current) drug therapy: Secondary | ICD-10-CM

## 2023-06-26 NOTE — Telephone Encounter (Signed)
 Copied from CRM [id number]. Topic: Clinical - Medication Refill >> Jun 26, 2023  1:44 PM Kathryne Eriksson wrote: Most Recent Primary Care Visit:  Provider: Vickii Chafe  Department: [facility]  Visit Type: NURSE VISIT  Date: 01/30/2023  Medication: amLODipine (NORVASC) 5 MG tablet , valsartan-hydrochlorothiazide (DIOVAN-HCT) 160-12.5 MG tablet  Has the patient contacted their pharmacy? Yes (Agent: If no, request that the patient contact the pharmacy for the refill. If patient does not wish to contact the pharmacy document the reason why and proceed with request.) (Agent: If yes, when and what did the pharmacy advise?)  Is this the correct pharmacy for this prescription? Yes If no, delete pharmacy and type the correct one.  This is the patient's preferred pharmacy:   [facility] [address], [place] - [address] [place] [address] Phone: [phone] Fax: [phone]   Has the prescription been filled recently? No  Is the patient out of the medication? Yes  Has the patient been seen for an appointment in the last year OR does the patient have an upcoming appointment? Yes  Can we respond through MyChart? Yes  Agent: Please be advised that Rx refills may take up to 3 business days. We ask that you follow-up with your pharmacy.

## 2023-06-26 NOTE — Telephone Encounter (Signed)
 Spoke to pt. Pt last had cpe with Dr. Fabian Sharp in 2023.   Schedule a cpe appt with Dr. Fabian Sharp.

## 2023-06-26 NOTE — Telephone Encounter (Signed)
 Patient Is calling in regarding her amlodipine and her other blood pressure medication she would like the medication filled today

## 2023-06-27 ENCOUNTER — Other Ambulatory Visit: Payer: Self-pay | Admitting: Family

## 2023-06-27 DIAGNOSIS — I10 Essential (primary) hypertension: Secondary | ICD-10-CM

## 2023-07-25 ENCOUNTER — Encounter: Admitting: Internal Medicine

## 2023-07-27 ENCOUNTER — Ambulatory Visit: Payer: Medicare Other | Admitting: Family Medicine
